# Patient Record
Sex: Male | Born: 1980
Health system: Southern US, Community
[De-identification: ages and names within clinical notes are randomized; demographics above are authoritative.]

## PROBLEM LIST (undated history)

## (undated) DIAGNOSIS — S060XAA Concussion with loss of consciousness status unknown, initial encounter: Secondary | ICD-10-CM

## (undated) DIAGNOSIS — D509 Iron deficiency anemia, unspecified: Secondary | ICD-10-CM

## (undated) DIAGNOSIS — T7840XA Allergy, unspecified, initial encounter: Secondary | ICD-10-CM

## (undated) DIAGNOSIS — S060X9A Concussion with loss of consciousness of unspecified duration, initial encounter: Secondary | ICD-10-CM

## (undated) DIAGNOSIS — Z973 Presence of spectacles and contact lenses: Secondary | ICD-10-CM

## (undated) HISTORY — DX: Concussion with loss of consciousness status unknown, initial encounter: S06.0XAA

## (undated) HISTORY — DX: Concussion with loss of consciousness of unspecified duration, initial encounter: S06.0X9A

## (undated) HISTORY — DX: Allergy, unspecified, initial encounter: T78.40XA

---

## 2000-10-22 HISTORY — PX: MENISCUS REPAIR: SHX5179

## 2014-11-22 ENCOUNTER — Ambulatory Visit: Payer: Self-pay | Admitting: Internal Medicine

## 2017-01-15 ENCOUNTER — Ambulatory Visit (INDEPENDENT_AMBULATORY_CARE_PROVIDER_SITE_OTHER): Payer: Commercial Managed Care - HMO | Admitting: Unknown Physician Specialty

## 2017-01-15 ENCOUNTER — Encounter: Payer: Self-pay | Admitting: Unknown Physician Specialty

## 2017-01-15 VITALS — BP 131/81 | HR 63 | Temp 98.3°F | Ht 69.1 in | Wt 200.5 lb

## 2017-01-15 DIAGNOSIS — Z0000 Encounter for general adult medical examination without abnormal findings: Secondary | ICD-10-CM

## 2017-01-15 DIAGNOSIS — L308 Other specified dermatitis: Secondary | ICD-10-CM | POA: Diagnosis not present

## 2017-01-15 DIAGNOSIS — B977 Papillomavirus as the cause of diseases classified elsewhere: Secondary | ICD-10-CM

## 2017-01-15 DIAGNOSIS — Z23 Encounter for immunization: Secondary | ICD-10-CM | POA: Diagnosis not present

## 2017-01-15 DIAGNOSIS — Z7689 Persons encountering health services in other specified circumstances: Secondary | ICD-10-CM | POA: Diagnosis not present

## 2017-01-15 DIAGNOSIS — L309 Dermatitis, unspecified: Secondary | ICD-10-CM | POA: Insufficient documentation

## 2017-01-15 MED ORDER — IMIQUIMOD 5 % EX CREA: TOPICAL_CREAM | CUTANEOUS | 0 refills | Status: DC

## 2017-01-15 MED ORDER — TRIAMCINOLONE ACETONIDE 0.1 % EX CREA: 1.0000 "application " | TOPICAL_CREAM | Freq: Two times a day (BID) | CUTANEOUS | 0 refills | Status: DC

## 2017-01-15 NOTE — Assessment & Plan Note (Signed)
Rx for aldara

## 2017-01-15 NOTE — Patient Instructions (Addendum)
Tdap Vaccine (Tetanus, Diphtheria and Pertussis): What You Need to Know 1. Why get vaccinated? Tetanus, diphtheria and pertussis are very serious diseases. Tdap vaccine can protect us from these diseases. And, Tdap vaccine given to pregnant women can protect newborn babies against pertussis. TETANUS (Lockjaw) is rare in the [place] today. It causes painful muscle tightening and stiffness, usually all over the body.  It can lead to tightening of muscles in the head and neck so you can't open your mouth, swallow, or sometimes even breathe. Tetanus kills about 1 out of 10 people who are infected even after receiving the best medical care.  DIPHTHERIA is also rare in the [place] today. It can cause a thick coating to form in the back of the throat.  It can lead to breathing problems, heart failure, paralysis, and death.  PERTUSSIS (Whooping Cough) causes severe coughing spells, which can cause difficulty breathing, vomiting and disturbed sleep.  It can also lead to weight loss, incontinence, and rib fractures. Up to 2 in 100 adolescents and 5 in 100 adults with pertussis are hospitalized or have complications, which could include pneumonia or death.  These diseases are caused by bacteria. Diphtheria and pertussis are spread from person to person through secretions from coughing or sneezing. Tetanus enters the body through cuts, scratches, or wounds. Before vaccines, as many as 200,000 cases of diphtheria, 200,000 cases of pertussis, and hundreds of cases of tetanus, were reported in the [place] each year. Since vaccination began, reports of cases for tetanus and diphtheria have dropped by about 99% and for pertussis by about 80%. 2. Tdap vaccine Tdap vaccine can protect adolescents and adults from tetanus, diphtheria, and pertussis. One dose of Tdap is routinely given at age 11 or 12. People who did not get Tdap at that age should get it as soon as possible. Tdap is especially  important for healthcare professionals and anyone having close contact with a baby younger than 12 months. Pregnant women should get a dose of Tdap during every pregnancy, to protect the newborn from pertussis. Infants are most at risk for severe, life-threatening complications from pertussis. Another vaccine, called Td, protects against tetanus and diphtheria, but not pertussis. A Td booster should be given every 10 years. Tdap may be given as one of these boosters if you have never gotten Tdap before. Tdap may also be given after a severe cut or burn to prevent tetanus infection. Your doctor or the person giving you the vaccine can give you more information. Tdap may safely be given at the same time as other vaccines. 3. Some people should not get this vaccine  A person who has ever had a life-threatening allergic reaction after a previous dose of any diphtheria, tetanus or pertussis containing vaccine, OR has a severe allergy to any part of this vaccine, should not get Tdap vaccine. Tell the person giving the vaccine about any severe allergies.  Anyone who had coma or long repeated seizures within 7 days after a childhood dose of DTP or DTaP, or a previous dose of Tdap, should not get Tdap, unless a cause other than the vaccine was found. They can still get Td.  Talk to your doctor if you: ? have seizures or another nervous system problem, ? had severe pain or swelling after any vaccine containing diphtheria, tetanus or pertussis, ? ever had a condition called Guillain-Barr Syndrome (GBS), ? aren't feeling well on the day the shot is scheduled. 4. Risks With any medicine, including   vaccines, there is a chance of side effects. These are usually mild and go away on their own. Serious reactions are also possible but are rare. Most people who get Tdap vaccine do not have any problems with it. Mild problems following Tdap: (Did not interfere with activities)  Pain where the shot was given (about  3 in 4 adolescents or 2 in 3 adults)  Redness or swelling where the shot was given (about 1 person in 5)  Mild fever of at least 100.4F (up to about 1 in 25 adolescents or 1 in 100 adults)  Headache (about 3 or 4 people in 10)  Tiredness (about 1 person in 3 or 4)  Nausea, vomiting, diarrhea, stomach ache (up to 1 in 4 adolescents or 1 in 10 adults)  Chills, sore joints (about 1 person in 10)  Body aches (about 1 person in 3 or 4)  Rash, swollen glands (uncommon)  Moderate problems following Tdap: (Interfered with activities, but did not require medical attention)  Pain where the shot was given (up to 1 in 5 or 6)  Redness or swelling where the shot was given (up to about 1 in 16 adolescents or 1 in 12 adults)  Fever over 102F (about 1 in 100 adolescents or 1 in 250 adults)  Headache (about 1 in 7 adolescents or 1 in 10 adults)  Nausea, vomiting, diarrhea, stomach ache (up to 1 or 3 people in 100)  Swelling of the entire arm where the shot was given (up to about 1 in 500).  Severe problems following Tdap: (Unable to perform usual activities; required medical attention)  Swelling, severe pain, bleeding and redness in the arm where the shot was given (rare).  Problems that could happen after any vaccine:  People sometimes faint after a medical procedure, including vaccination. Sitting or lying down for about 15 minutes can help prevent fainting, and injuries caused by a fall. Tell your doctor if you feel dizzy, or have vision changes or ringing in the ears.  Some people get severe pain in the shoulder and have difficulty moving the arm where a shot was given. This happens very rarely.  Any medication can cause a severe allergic reaction. Such reactions from a vaccine are very rare, estimated at fewer than 1 in a million doses, and would happen within a few minutes to a few hours after the vaccination. As with any medicine, there is a very remote chance of a vaccine  causing a serious injury or death. The safety of vaccines is always being monitored. For more information, visit: www.cdc.gov/vaccinesafety/ 5. What if there is a serious problem? What should I look for? Look for anything that concerns you, such as signs of a severe allergic reaction, very high fever, or unusual behavior. Signs of a severe allergic reaction can include hives, swelling of the face and throat, difficulty breathing, a fast heartbeat, dizziness, and weakness. These would usually start a few minutes to a few hours after the vaccination. What should I do?  If you think it is a severe allergic reaction or other emergency that can't wait, call 9-1-1 or get the person to the nearest hospital. Otherwise, call your doctor.  Afterward, the reaction should be reported to the Vaccine Adverse Event Reporting System (VAERS). Your doctor might file this report, or you can do it yourself through the VAERS web site at www.vaers.hhs.gov, or by calling 1-800-822-7967. ? VAERS does not give medical advice. 6. The National Vaccine Injury Compensation Program The National   Vaccine Injury Compensation Program (VICP) is a federal program that was created to compensate people who may have been injured by certain vaccines. Persons who believe they may have been injured by a vaccine can learn about the program and about filing a claim by calling 1-800-338-2382 or visiting the VICP website at www.hrsa.gov/vaccinecompensation. There is a time limit to file a claim for compensation. 7. How can I learn more?  Ask your doctor. He or she can give you the vaccine package insert or suggest other sources of information.  Call your local or state health department.  Contact the Centers for Disease Control and Prevention (CDC): ? Call 1-800-232-4636 (1-800-CDC-INFO) or ? Visit CDC's website at www.cdc.gov/vaccines CDC Tdap Vaccine VIS (12/15/13) This information is not intended to replace advice given to you by your  health care provider. Make sure you discuss any questions you have with your health care provider. Document Released: 04/08/2012 Document Revised: 06/28/2016 Document Reviewed: 06/28/2016 Elsevier Interactive Patient Education  2017 Elsevier Inc.  

## 2017-01-15 NOTE — Addendum Note (Signed)
Addended by: Kathrine Haddock on: 01/15/2017 04:12 PM   Modules accepted: Orders

## 2017-01-15 NOTE — Progress Notes (Addendum)
BP 131/81 (BP Location: Left Arm, Patient Position: Sitting, Cuff Size: Large)   Pulse 63   Temp 98.3 F (36.8 C)   Ht 5' 9.1" (1.755 m)   Wt 200 lb 8 oz (90.9 kg)   SpO2 98%   BMI 29.52 kg/m    Subjective:    Patient ID: Eric Kline, male    DOB: 30-Jun-1981, 36 y.o.   MRN: [id number]  HPI: Eric Kline is a 36 y.o. male  Chief Complaint  Patient presents with  . Establish Care   Social History   Social History  . Marital status: Single    Spouse name: N/A  . Number of children: N/A  . Years of education: N/A   Occupational History  . Not on file.   Social History Main Topics  . Smoking status: Former Research scientist (life sciences)  . Smokeless tobacco: Former Systems developer    Types: Chew  . Alcohol use No     Comment: pt states he has not drank in the last 5 months   . 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 Sexual activity: Not Currently   Other Topics Concern  . Not on file   Social History Narrative  . No narrative on file   Family History  Problem Relation Age of Onset  . Cancer Mother     skin  . Alzheimer's disease Maternal Grandfather    Past Medical History:  Diagnosis Date  . Allergy   . Concussion    x3   Past Surgical History:  Procedure Laterality Date  . MENISCUS REPAIR  2002   left    Depression screen [facility] 2/9 01/15/2017  Decreased Interest 1  Down, Depressed, Hopeless 1  PHQ - 2 Score 2   Eczema Bilateral palms flare in the winter time.    Gluten Finds if he eats pasta he gets bloated and feels he will pass out  Pt states he is exercising and lost 20 pounds.    Relevant past medical, surgical, family and social history reviewed and updated as indicated. Interim medical history since our last visit reviewed. Allergies and medications reviewed and updated.  Review of Systems  Genitourinary:       New bumps noted base of penis    Per HPI unless specifically indicated above       Objective:    BP 131/81 (BP Location: Left Arm, Patient Position: Sitting, Cuff Size: Large)   Pulse 63   Temp 98.3 F (36.8 C)   Ht 5' 9.1" (1.755 m)   Wt 200 lb 8 oz (90.9 kg)   SpO2 98%   BMI 29.52 kg/m   Wt Readings from Last 3 Encounters:  01/15/17 200 lb 8 oz (90.9 kg)    Physical Exam  Constitutional: He is oriented to person, place, and time. He appears well-developed and well-nourished.  HENT:  Head: Normocephalic.  Right Ear: Tympanic membrane, external ear and ear canal normal.  Left Ear: Tympanic membrane, external ear and ear canal normal.  Mouth/Throat: Uvula is midline, oropharynx is clear and moist and mucous membranes are normal.  Eyes: Pupils are equal, round, and reactive to light.  Cardiovascular: Normal rate, regular rhythm and normal heart sounds.  Exam reveals no gallop and no friction rub.   No murmur heard. Pulmonary/Chest: Effort normal and breath sounds normal. No respiratory distress.  Abdominal: Soft. Bowel sounds are normal. He exhibits no  distension. There is no tenderness.  Musculoskeletal: Normal range of motion.  Neurological: He is alert and oriented to person, place, and time. He has normal reflexes.  Skin: Skin is warm and dry.  Rash palm of hands.  Bumps base of penis  Psychiatric: He has a normal mood and affect. His behavior is normal. Judgment and thought content normal.    No results found for this or any previous visit.    Assessment & Plan:   Problem List Items Addressed This Visit      Unprioritized   Eczema   Relevant Medications   triamcinolone cream (KENALOG) 0.1 %   HPV (human papilloma virus) infection    Rx for aldara      Relevant Medications   imiquimod (ALDARA) 5 % cream (Start on 01/16/2017)    Other Visit Diagnoses    Need for diphtheria-tetanus-pertussis (Tdap) vaccine, adult/adolescent    -  Primary   Relevant Orders   Tdap vaccine greater than or equal to 7yo IM (Completed)   Routine general medical  examination at a health care facility       Relevant Orders   Celiac Ab tTG DGP TIgA   Lipid Panel w/o Chol/HDL Ratio   Comprehensive metabolic panel   HIV antibody   TSH   CBC with Differential/Platelet   HSV(herpes simplex vrs) 1+2 ab-IgG   RPR   GC probe amplification, urine       Follow up plan: Return in about 1 year (around 01/15/2018), or if symptoms worsen or fail to improve.

## 2017-01-16 ENCOUNTER — Encounter: Payer: Self-pay | Admitting: Unknown Physician Specialty

## 2017-01-18 LAB — CBC WITH DIFFERENTIAL/PLATELET
BASOS: 0 %
Basophils Absolute: 0 10*3/uL (ref 0.0–0.2)
EOS (ABSOLUTE): 0.2 10*3/uL (ref 0.0–0.4)
EOS: 2 %
HEMOGLOBIN: 15.7 g/dL (ref 13.0–17.7)
Hematocrit: 44.5 % (ref 37.5–51.0)
Immature Grans (Abs): 0 10*3/uL (ref 0.0–0.1)
Immature Granulocytes: 0 %
Lymphocytes Absolute: 2.3 10*3/uL (ref 0.7–3.1)
Lymphs: 28 %
MCH: 31.5 pg (ref 26.6–33.0)
MCHC: 35.3 g/dL (ref 31.5–35.7)
MCV: 89 fL (ref 79–97)
MONOCYTES: 5 %
MONOS ABS: 0.4 10*3/uL (ref 0.1–0.9)
NEUTROS ABS: 5.4 10*3/uL (ref 1.4–7.0)
Neutrophils: 65 %
Platelets: 278 10*3/uL (ref 150–379)
RBC: 4.98 x10E6/uL (ref 4.14–5.80)
RDW: 13 % (ref 12.3–15.4)
WBC: 8.3 10*3/uL (ref 3.4–10.8)

## 2017-01-18 LAB — LIPID PANEL W/O CHOL/HDL RATIO
Cholesterol, Total: 182 mg/dL (ref 100–199)
HDL: 34 mg/dL — AB (ref >39)
LDL Calculated: 113 mg/dL — ABNORMAL HIGH (ref 0–99)
Triglycerides: 174 mg/dL — ABNORMAL HIGH (ref 0–149)
VLDL CHOLESTEROL CAL: 35 mg/dL (ref 5–40)

## 2017-01-18 LAB — COMPREHENSIVE METABOLIC PANEL
ALK PHOS: 69 IU/L (ref 39–117)
ALT: 16 IU/L (ref 0–44)
AST: 15 IU/L (ref 0–40)
Albumin/Globulin Ratio: 2.2 (ref 1.2–2.2)
Albumin: 4.8 g/dL (ref 3.5–5.5)
BUN/Creatinine Ratio: 10 (ref 9–20)
BUN: 10 mg/dL (ref 6–20)
Bilirubin Total: 0.4 mg/dL (ref 0.0–1.2)
CHLORIDE: 102 mmol/L (ref 96–106)
CO2: 23 mmol/L (ref 18–29)
Calcium: 9.4 mg/dL (ref 8.7–10.2)
Creatinine, Ser: 1.02 mg/dL (ref 0.76–1.27)
GFR, EST AFRICAN AMERICAN: 108 mL/min/{1.73_m2} (ref >59)
GFR, EST NON AFRICAN AMERICAN: 93 mL/min/{1.73_m2} (ref >59)
GLOBULIN, TOTAL: 2.2 g/dL (ref 1.5–4.5)
GLUCOSE: 93 mg/dL (ref 65–99)
POTASSIUM: 4 mmol/L (ref 3.5–5.2)
SODIUM: 143 mmol/L (ref 134–144)
Total Protein: 7 g/dL (ref 6.0–8.5)

## 2017-01-18 LAB — HIV ANTIBODY (ROUTINE TESTING W REFLEX): HIV Screen 4th Generation wRfx: NONREACTIVE

## 2017-01-18 LAB — TSH: TSH: 1.56 u[IU]/mL (ref 0.450–4.500)

## 2017-01-18 LAB — RPR: RPR Ser Ql: NONREACTIVE

## 2017-01-18 LAB — HSV(HERPES SIMPLEX VRS) I + II AB-IGG: HSV 1 Glycoprotein G Ab, IgG: 2.02 index — ABNORMAL HIGH (ref 0.00–0.90)

## 2017-01-18 LAB — CELIAC AB TTG DGP TIGA
Antigliadin Abs, IgA: 3 units (ref 0–19)
Gliadin IgG: 2 units (ref 0–19)
IGA/IMMUNOGLOBULIN A, SERUM: 196 mg/dL (ref 90–386)
Transglutaminase IgA: <2 U/mL (ref 0–3)

## 2017-01-20 LAB — SPECIMEN STATUS REPORT

## 2017-01-20 LAB — GC/CHLAMYDIA PROBE AMP
CHLAMYDIA, DNA PROBE: NEGATIVE
Neisseria gonorrhoeae by PCR: NEGATIVE

## 2017-03-20 ENCOUNTER — Telehealth: Payer: Self-pay | Admitting: Unknown Physician Specialty

## 2017-03-20 DIAGNOSIS — M25569 Pain in unspecified knee: Principal | ICD-10-CM

## 2017-03-20 DIAGNOSIS — G8929 Other chronic pain: Secondary | ICD-10-CM

## 2017-03-20 NOTE — Telephone Encounter (Addendum)
Patient is still having ankle/knee issues. He called to see if he could get Malachy Mood to refer him to orthopedic physician or if he would need to be seen here before she could do that.  Thanks  [phone]

## 2017-03-20 NOTE — Telephone Encounter (Signed)
Routing to provider. I do not see where this was discussed in last OV note.

## 2017-03-20 NOTE — Telephone Encounter (Signed)
Called to let patient know that referral has been entered and patient states that they have already contacted him regarding an appointment.

## 2017-04-03 ENCOUNTER — Ambulatory Visit (INDEPENDENT_AMBULATORY_CARE_PROVIDER_SITE_OTHER): Payer: 59 | Admitting: Orthopedic Surgery

## 2017-04-03 ENCOUNTER — Ambulatory Visit (INDEPENDENT_AMBULATORY_CARE_PROVIDER_SITE_OTHER): Payer: 59

## 2017-04-03 ENCOUNTER — Encounter (INDEPENDENT_AMBULATORY_CARE_PROVIDER_SITE_OTHER): Payer: Self-pay | Admitting: Orthopedic Surgery

## 2017-04-03 DIAGNOSIS — M25572 Pain in left ankle and joints of left foot: Secondary | ICD-10-CM

## 2017-04-03 DIAGNOSIS — G8929 Other chronic pain: Secondary | ICD-10-CM | POA: Diagnosis not present

## 2017-04-03 DIAGNOSIS — M25562 Pain in left knee: Secondary | ICD-10-CM

## 2017-04-06 NOTE — Progress Notes (Signed)
Office Visit Note   Patient: Eric Kline           Date of Birth: 1980/11/11           MRN: [id number] Visit Date: 04/03/2017 Requested by: Kathrine Haddock, NP [address], [place] [id number] PCP: Kathrine Haddock, NP  Subjective: Chief Complaint  Patient presents with  . Left Knee - Pain  . Left Ankle - Pain    HPI: Eric Kline is a patient with left knee pain of 2 months duration.  He's had previous surgery on the left knee with meniscal resection.  Reports pain is constant but it does come and go in varying in severity.  Worse with increased activity.  Does a lot of walking for his job.  Does have difficulty running.  Patient also describes instability of his left ankle.  He states his ankle rolls very easily with weakness and popping.  Previously had left knee surgery in 2002.  That was done in [place].  He feels like the knee problem may be from his ankle.  He did have a severe ankle injury 6 weeks prior to his knee surgery.  His heart for him to run.  He can only run 1/8 of a mile without pain in both the knee and ankle.  Describes pain in the patellar region.  He works as a Radiographer, therapeutic.  He is in [employer] at the time of his knee and ankle injury.              ROS: All systems reviewed are negative as they relate to the chief complaint within the history of present illness.  Patient denies  fevers or chills.   Assessment & Plan: Visit Diagnoses:  1. Chronic pain of left knee   2. 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  He's had 2 months of symptoms and has failed conservative measurements including self rehabilitation as well as a course of anti-inflammatories.  Needs MRI scan of that left knee to evaluate both medial and lateral meniscal tearing.  More significant is his significant left ankle instability.  He will need surgical reconstruction of the left ankle.  Need to evaluate  whether or not in addition to severe ligamentous laxity he has chondral damage on the articular surface.  MRI of ankle is also pending.  I'll see him back after the studies  Follow-Up Instructions: Return for after MRI.   Orders:  Orders Placed This Encounter  Procedures  . XR KNEE 3 VIEW LEFT  . XR Ankle Complete Left  . MR Knee Left w/o contrast  . MR Ankle Left w/o contrast   No orders of the defined types were placed in this encounter.     Procedures: No procedures performed   Clinical Data: No additional findings.  Objective: Vital Signs: There were no vitals taken for this visit.  Physical Exam:   Constitutional: Patient appears well-developed HEENT:  Head: Normocephalic Eyes:EOM are normal Neck: Normal range of motion Cardiovascular: Normal rate Pulmonary/chest: Effort normal Neurologic: Patient is alert Skin: Skin is warm Psychiatric: Patient has normal mood and affect    Ortho Exam: Orthopedic examination of the ankle demonstrates only mild swelling of the left ankle but significant instability left versus right to varus tilt testing and anterior drawer testing.  He has palpable intact nontender anterior tibia.  Posterior tib peroneal and Achilles tendon with palpable  pedal pulses and intact sensation on the dorsal plantar aspect of the foot.  No other masses lymph adenopathy or skin changes noted in the left ankle region.  On examination of the left knee he has trace effusion stable collateral crucial ligaments intact since mechanism medial and lateral joint line tenderness positive direct compression testing for medial and lateral compartment pathology with mild patellofemoral crepitus and negative patellar apprehension and no tenderness of the patellar quad tendon.  There is no groin pain with internal/external rotation of the leg and no other masses lymph adenopathy or skin changes noted in the left knee region  Specialty Comments:  No specialty comments  available.  Imaging: No results found.   PMFS History: Patient Active Problem List   Diagnosis Date Noted  . HPV (human papilloma virus) infection 01/15/2017  . Eczema 01/15/2017   Past Medical History:  Diagnosis Date  . Allergy   . Concussion    x3    Family History  Problem Relation Age of Onset  . Cancer Mother        skin  . Alzheimer's disease Maternal Grandfather     Past Surgical History:  Procedure Laterality Date  . MENISCUS REPAIR  2002   left    Social History   Occupational History  . Not on file.   Social History Main Topics  . Smoking status: Former Research scientist (life sciences)  . Smokeless tobacco: Former Systems developer    Types: Chew  . Alcohol use No     Comment: pt states he has not drank in the last 5 months   . 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 Sexual activity: Not Currently

## 2017-04-19 ENCOUNTER — Ambulatory Visit (INDEPENDENT_AMBULATORY_CARE_PROVIDER_SITE_OTHER): Payer: 59 | Admitting: Orthopedic Surgery

## 2017-04-21 ENCOUNTER — Ambulatory Visit
Admission: RE | Admit: 2017-04-21 | Discharge: 2017-04-21 | Disposition: A | Discharge status: Home / Self Care | Payer: 59 | Source: Ambulatory Visit | Attending: Orthopedic Surgery | Admitting: Orthopedic Surgery

## 2017-04-21 DIAGNOSIS — M25572 Pain in left ankle and joints of left foot: Secondary | ICD-10-CM | POA: Diagnosis not present

## 2017-04-21 DIAGNOSIS — M25562 Pain in left knee: Principal | ICD-10-CM

## 2017-04-21 DIAGNOSIS — G8929 Other chronic pain: Secondary | ICD-10-CM

## 2017-04-21 DIAGNOSIS — S83242A Other tear of medial meniscus, current injury, left knee, initial encounter: Secondary | ICD-10-CM | POA: Diagnosis not present

## 2017-04-21 HISTORY — PX: ANKLE ARTHROSCOPY: SUR85

## 2017-04-22 ENCOUNTER — Ambulatory Visit (INDEPENDENT_AMBULATORY_CARE_PROVIDER_SITE_OTHER): Payer: 59 | Admitting: Orthopedic Surgery

## 2017-04-22 ENCOUNTER — Encounter (INDEPENDENT_AMBULATORY_CARE_PROVIDER_SITE_OTHER): Payer: Self-pay | Admitting: Orthopedic Surgery

## 2017-04-22 DIAGNOSIS — M25572 Pain in left ankle and joints of left foot: Secondary | ICD-10-CM | POA: Diagnosis not present

## 2017-04-22 NOTE — Addendum Note (Signed)
Addended by: Marcene Duos on: 04/22/2017 05:48 PM   Modules accepted: Level of Service

## 2017-04-22 NOTE — Progress Notes (Signed)
Office Visit Note   Patient: Eric Kline           Date of Birth: 07-22-1981           MRN: [id number] Visit Date: 04/22/2017 Requested by: Kathrine Haddock, NP [address], [place] [address] PCP: Kathrine Haddock, NP  Subjective: Chief Complaint  Patient presents with  . Left Ankle - Follow-up    HPI: Eric Kline is a 36 year old patient here to review MRI of left ankle as well as MRI of left knee.  He's had previous arthroscopy in the left knee in 2002.  MRI scan on that knee is reviewed and it shows post-meniscectomy changes but there may also be a component of meniscal instability with that remnant fragment.  He states his ankle is hurting him worse than the knee.  Ankle MRI scan also reviewed and shows incompetency of the anterior talofibular ligament as well as a cyst in the anterolateral aspect of the ankle.  Had Dr. due to review the MRI of the ankle as well as examined Eric Kline.  He concurs that Eric Kline is a good candidate for surgical stabilization of the ankle              ROS: All systems reviewed are negative as they relate to the chief complaint within the history of present illness.  Patient denies  fevers or chills.   Assessment & Plan: Visit Diagnoses:  1. Pain in left ankle and joints of left foot     Plan: Impression is left ankle instability in need of surgical stabilization.  Patient does have rolling of the ankle about once every 2-3 days.  He has significant anterior and varus instability to the ankle.  Dr. due to will address this surgically in the near future.  In regards to the left knee this is something that we can look at after he is recovered from his ankle.  Arthroscopic debridement and/or potential all inside meniscal repair could be considered for Eric Kline depending on what the findings are.  I will see him back as needed regarding the knee  Follow-Up Instructions: No Follow-up on file.   Orders:  No orders of the defined types were placed in this  encounter.  No orders of the defined types were placed in this encounter.     Procedures: No procedures performed   Clinical Data: No additional findings.  Objective: Vital Signs: There were no vitals taken for this visit.  Physical Exam:   Constitutional: Patient appears well-developed HEENT:  Head: Normocephalic Eyes:EOM are normal Neck: Normal range of motion Cardiovascular: Normal rate Pulmonary/chest: Effort normal Neurologic: Patient is alert Skin: Skin is warm Psychiatric: Patient has normal mood and affect    Ortho Exam: Orthopedic exam demonstrates increased anterior instability left ankle versus right as well as increased instability to varus stress testing left versus right.  On the left knee there is no effusion and good range of motion most of his pain is directly beneath the patella.  Extensor mechanism is intact.  Collateral and cruciate ligaments are stable.  Specialty Comments:  No specialty comments available.  Imaging: No results found.   PMFS History: Patient Active Problem List   Diagnosis Date Noted  . HPV (human papilloma virus) infection 01/15/2017  . Eczema 01/15/2017   Past Medical History:  Diagnosis Date  . Allergy   . Concussion    x3    Family History  Problem Relation Age of Onset  . Cancer Mother  skin  . Alzheimer's disease Maternal Grandfather     Past Surgical History:  Procedure Laterality Date  . MENISCUS REPAIR  2002   left    Social History   Occupational History  . Not on file.   Social History Main Topics  . Smoking status: Former Research scientist (life sciences)  . Smokeless tobacco: Former Systems developer    Types: Chew  . Alcohol use No     Comment: pt states he has not drank in the last 5 months   . 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 Sexual activity: Not Currently

## 2017-04-22 NOTE — Progress Notes (Signed)
   Office Visit Note   Patient: Eric Kline           Date of Birth: May 09, 1981           MRN: [id number] Visit Date: 04/22/2017              Requested by: Kathrine Haddock, NP [address], [place] [address] PCP: Kathrine Haddock, NP  Chief Complaint  Patient presents with  . Left Ankle - Follow-up      HPI: Chronic ankle instability left ankle.  Assessment & Plan: Visit Diagnoses:  1. Pain in left ankle and joints of left foot     Plan: Patient states that he has failed prolonged conservative therapy. Discussed with patient we could proceed with reconstruction of the anterior talofibular ligament with an open Brostrom reconstruction. Would then proceed with arthroscopy of the ankle to debride the synovitis and impingement. Discussed that he would need to be off his foot for 4 weeks and then a fracture boot for 4 weeks. Risks and benefits were discussed patient states he understands wish to proceed at this time.  Follow-Up Instructions: No Follow-up on file.   Ortho Exam  Patient is alert, oriented, no adenopathy, well-dressed, normal affect, normal respiratory effort. Examination patient has a positive anterior drawer on the left with a clunk. There is approximately 3 mm of anterior translation as well as increased instability with varus stressing. He also has tenderness to palpation anteriorly over the ankle.  Imaging: No results found.  Labs: No results found for: HGBA1C, ESRSEDRATE, CRP, LABURIC, REPTSTATUS, GRAMSTAIN, CULT, LABORGA  Orders:  No orders of the defined types were placed in this encounter.  No orders of the defined types were placed in this encounter.    Procedures: No procedures performed  Clinical Data: No additional findings.  ROS:  All other systems negative, except as noted in the HPI. Review of Systems  Objective: Vital Signs: There were no vitals taken for this visit.  Specialty Comments:  No specialty comments available.  PMFS  History: Patient Active Problem List   Diagnosis Date Noted  . HPV (human papilloma virus) infection 01/15/2017  . Eczema 01/15/2017   Past Medical History:  Diagnosis Date  . Allergy   . Concussion    x3    Family History  Problem Relation Age of Onset  . Cancer Mother        skin  . Alzheimer's disease Maternal Grandfather     Past Surgical History:  Procedure Laterality Date  . MENISCUS REPAIR  2002   left    Social History   Occupational History  . Not on file.   Social History Main Topics  . Smoking status: Former Research scientist (life sciences)  . Smokeless tobacco: Former Systems developer    Types: Chew  . Alcohol use No     Comment: pt states he has not drank in the last 5 months   . 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 Sexual activity: Not Currently

## 2017-04-23 ENCOUNTER — Telehealth (INDEPENDENT_AMBULATORY_CARE_PROVIDER_SITE_OTHER): Payer: Self-pay

## 2017-04-23 ENCOUNTER — Other Ambulatory Visit (INDEPENDENT_AMBULATORY_CARE_PROVIDER_SITE_OTHER): Payer: Self-pay

## 2017-04-23 NOTE — Telephone Encounter (Signed)
Patient is undergoing an ankle reconstruction and ankle arthroscopy. Patient could return to work in 1-2 weeks for seated work.

## 2017-04-23 NOTE — Telephone Encounter (Signed)
You saw this pt was Dr. Marlou Sa yesterday and are going to do surgery sometime next week. What procedure are you doing and what time frame would you recommend the pt will be out of work?

## 2017-04-23 NOTE — Telephone Encounter (Signed)
Pt called and states that he is having surgery with Dr. Sharol Given next week and requires a note for his employer to state that he will be out of work for a few weeks for recovery and to please call him once this has been done.

## 2017-04-23 NOTE — Telephone Encounter (Signed)
Letter written and advised pt that it is ready for pick up or he can access this in my chart.

## 2017-04-29 ENCOUNTER — Telehealth (INDEPENDENT_AMBULATORY_CARE_PROVIDER_SITE_OTHER): Payer: Self-pay | Admitting: Orthopedic Surgery

## 2017-04-29 NOTE — Telephone Encounter (Signed)
PT ASKED IF YOU COULD PUT HIS WORK NOTE IN MYCHART PLEASE.  [phone]

## 2017-04-29 NOTE — Telephone Encounter (Signed)
Sent to patient.

## 2017-04-30 DIAGNOSIS — M19172 Post-traumatic osteoarthritis, left ankle and foot: Secondary | ICD-10-CM | POA: Diagnosis not present

## 2017-04-30 DIAGNOSIS — M25872 Other specified joint disorders, left ankle and foot: Secondary | ICD-10-CM | POA: Diagnosis not present

## 2017-04-30 DIAGNOSIS — S93412D Sprain of calcaneofibular ligament of left ankle, subsequent encounter: Secondary | ICD-10-CM | POA: Diagnosis not present

## 2017-04-30 DIAGNOSIS — M25372 Other instability, left ankle: Secondary | ICD-10-CM | POA: Diagnosis not present

## 2017-04-30 DIAGNOSIS — M659 Synovitis and tenosynovitis, unspecified: Secondary | ICD-10-CM | POA: Diagnosis not present

## 2017-05-08 ENCOUNTER — Ambulatory Visit (INDEPENDENT_AMBULATORY_CARE_PROVIDER_SITE_OTHER): Payer: 59 | Admitting: Family

## 2017-05-08 DIAGNOSIS — Z9889 Other specified postprocedural states: Secondary | ICD-10-CM

## 2017-05-08 NOTE — Progress Notes (Signed)
   Post-Op Visit Note   Patient: Eric Kline           Date of Birth: 09/27/1981           MRN: [id number] Visit Date: 05/08/2017 PCP: Kathrine Haddock, NP  Chief Complaint:  Chief Complaint  Patient presents with  . Left Ankle - Routine Post Op    HPI:  The patient is a 36 year old gentleman who is 1 week status post left ankle arthroscopy with anterior talofibular ligament reconstruction. He's been doing well overall. He has been nonweightbearing with crutches. The surgical dressing was removed today.    Ortho Exam Incisions are him healing well the portals are clean and dry. Sutures to be harvested today. There is no gaping, no drainage. moderate swelling to the ankle.  Visit Diagnoses:  1. S/P surgical manipulation of ankle joint     Plan: Sutures harvested today. He'll begin daily Dial soap cleansing. Apply dry dressing. Placed in a Cam Walker he may begin weightbearing in the cam boot. Advised wear this ambulation. do to work on dorsiflexion of the ankle.  Follow-Up Instructions: Return in about 2 weeks (around 05/22/2017).   Imaging: No results found.  Orders:  No orders of the defined types were placed in this encounter.  No orders of the defined types were placed in this encounter.    PMFS History: Patient Active Problem List   Diagnosis Date Noted  . S/P surgical manipulation of ankle joint 05/08/2017  . HPV (human papilloma virus) infection 01/15/2017  . Eczema 01/15/2017   Past Medical History:  Diagnosis Date  . Allergy   . Concussion    x3    Family History  Problem Relation Age of Onset  . Cancer Mother        skin  . Alzheimer's disease Maternal Grandfather     Past Surgical History:  Procedure Laterality Date  . MENISCUS REPAIR  2002   left    Social History   Occupational History  . Not on file.   Social History Main Topics  . Smoking status: Former Research scientist (life sciences)  . Smokeless tobacco: Former Systems developer    Types: Chew  . Alcohol use No   Comment: pt states he has not drank in the last 5 months   . 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 Sexual activity: Not Currently

## 2017-05-22 ENCOUNTER — Encounter (INDEPENDENT_AMBULATORY_CARE_PROVIDER_SITE_OTHER): Payer: Self-pay | Admitting: Orthopedic Surgery

## 2017-05-22 ENCOUNTER — Ambulatory Visit (INDEPENDENT_AMBULATORY_CARE_PROVIDER_SITE_OTHER): Payer: 59 | Admitting: Family

## 2017-05-22 ENCOUNTER — Ambulatory Visit (INDEPENDENT_AMBULATORY_CARE_PROVIDER_SITE_OTHER): Payer: 59 | Admitting: Orthopedic Surgery

## 2017-05-22 VITALS — Ht 69.0 in | Wt 200.0 lb

## 2017-05-22 DIAGNOSIS — M25872 Other specified joint disorders, left ankle and foot: Secondary | ICD-10-CM | POA: Insufficient documentation

## 2017-05-22 NOTE — Progress Notes (Signed)
   Office Visit Note   Patient: Eric Kline           Date of Birth: 04/23/1981           MRN: [id number] Visit Date: 05/22/2017              Requested by: Kathrine Haddock, NP [address], [place] [address] PCP: Kathrine Haddock, NP  Chief Complaint  Patient presents with  . Left Ankle - Routine Post Op    3 weeks post op ankle scope with anterior talofibular ligament reconstruction       HPI: Patient presents 3 weeks status post debridement for impingement left ankle as well as anterior talofibular ligament reconstruction. Patient has no complaints he is weightbearing with a fracture boot he states he does have some tenderness when he does not wear the fracture boot.  Assessment & Plan: Visit Diagnoses:  1. Ankle impingement syndrome, left     Plan: Recommended to work on range of motion exercises no strengthening at this time continue with her fracture boot for all activities follow-up in 3 weeks at which time we will discontinue the fracture boot.  Follow-Up Instructions: Return in about 3 weeks (around 06/12/2017).   Ortho Exam  Patient is alert, oriented, no adenopathy, well-dressed, normal affect, normal respiratory effort. Examination the incision and portals are clean and dry there is no swelling there is no redness no cellulitis no drainage. Patient has a stable anterior drawer of 1 mm there is no laxity there is good stability of the anterior talofibular ligament.  Imaging: No results found.  Labs: No results found for: HGBA1C, ESRSEDRATE, CRP, LABURIC, REPTSTATUS, GRAMSTAIN, CULT, LABORGA  Orders:  No orders of the defined types were placed in this encounter.  No orders of the defined types were placed in this encounter.    Procedures: No procedures performed  Clinical Data: No additional findings.  ROS:  All other systems negative, except as noted in the HPI. Review of Systems  Objective: Vital Signs: Ht 5\' 9"  (1.753 m)   Wt 200 lb (90.7 kg)   BMI  29.53 kg/m   Specialty Comments:  No specialty comments available.  PMFS History: Patient Active Problem List   Diagnosis Date Noted  . Ankle impingement syndrome, left 05/22/2017  . S/P surgical manipulation of ankle joint 05/08/2017  . HPV (human papilloma virus) infection 01/15/2017  . Eczema 01/15/2017   Past Medical History:  Diagnosis Date  . Allergy   . Concussion    x3    Family History  Problem Relation Age of Onset  . Cancer Mother        skin  . Alzheimer's disease Maternal Grandfather     Past Surgical History:  Procedure Laterality Date  . MENISCUS REPAIR  2002   left    Social History   Occupational History  . Not on file.   Social History Main Topics  . Smoking status: Former Research scientist (life sciences)  . Smokeless tobacco: Former Systems developer    Types: Chew  . Alcohol use No     Comment: pt states he has not drank in the last 5 months   . 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 Sexual activity: Not Currently

## 2017-06-11 ENCOUNTER — Ambulatory Visit (INDEPENDENT_AMBULATORY_CARE_PROVIDER_SITE_OTHER): Payer: 59 | Admitting: Orthopedic Surgery

## 2017-06-11 ENCOUNTER — Encounter (INDEPENDENT_AMBULATORY_CARE_PROVIDER_SITE_OTHER): Payer: Self-pay | Admitting: Orthopedic Surgery

## 2017-06-11 DIAGNOSIS — M25872 Other specified joint disorders, left ankle and foot: Secondary | ICD-10-CM | POA: Diagnosis not present

## 2017-06-11 NOTE — Progress Notes (Signed)
   Office Visit Note   Patient: Eric Kline           Date of Birth: 1981/08/05           MRN: [id number] Visit Date: 06/11/2017              Requested by: Kathrine Haddock, NP [address], [place] [address] PCP: Kathrine Haddock, NP  Chief Complaint  Patient presents with  . Left Ankle - Follow-up      HPI: Patient is a 36 year old gentleman 6 weeks status post Brostrom reconstruction of the anterior talofibular ligament left ankle with debridement of the ankle arthroscopically. Patient states he is comfortable no problems no pain.  Assessment & Plan: Visit Diagnoses:  1. Ankle impingement syndrome, left     Plan: Patient was given instructions for heel cord stretching to unload weight from the fifth metatarsal head. He will advance to an ankle stabilizing orthosis for activities daily living and increase his activities as he feels comfortable. No running. Examination the portals and incision are well-healed. Anterior drawer is stable with no laxity.  Follow-Up Instructions: Return in about 4 weeks (around 07/09/2017).   Ortho Exam  Patient is alert, oriented, no adenopathy, well-dressed, normal affect, normal respiratory effort. Examination the portals and surgical incision are well healed. He has good range of motion the ankle and subtalar joint. Anterior drawer is stable with no laxity.  Imaging: No results found. No images are attached to the encounter.  Labs: No results found for: HGBA1C, ESRSEDRATE, CRP, LABURIC, REPTSTATUS, GRAMSTAIN, CULT, LABORGA  Orders:  No orders of the defined types were placed in this encounter.  No orders of the defined types were placed in this encounter.    Procedures: No procedures performed  Clinical Data: No additional findings.  ROS:  All other systems negative, except as noted in the HPI. Review of Systems  Objective: Vital Signs: There were no vitals taken for this visit.  Specialty Comments:  No specialty comments  available.  PMFS History: Patient Active Problem List   Diagnosis Date Noted  . Ankle impingement syndrome, left 05/22/2017  . S/P surgical manipulation of ankle joint 05/08/2017  . HPV (human papilloma virus) infection 01/15/2017  . Eczema 01/15/2017   Past Medical History:  Diagnosis Date  . Allergy   . Concussion    x3    Family History  Problem Relation Age of Onset  . Cancer Mother        skin  . Alzheimer's disease Maternal Grandfather     Past Surgical History:  Procedure Laterality Date  . MENISCUS REPAIR  2002   left    Social History   Occupational History  . Not on file.   Social History Main Topics  . Smoking status: Former Research scientist (life sciences)  . Smokeless tobacco: Former Systems developer    Types: Chew  . Alcohol use No     Comment: pt states he has not drank in the last 5 months   . 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 Sexual activity: Not Currently

## 2017-07-09 ENCOUNTER — Encounter (INDEPENDENT_AMBULATORY_CARE_PROVIDER_SITE_OTHER): Payer: Self-pay

## 2017-07-09 ENCOUNTER — Ambulatory Visit (INDEPENDENT_AMBULATORY_CARE_PROVIDER_SITE_OTHER): Payer: 59 | Admitting: Orthopedic Surgery

## 2017-07-09 ENCOUNTER — Encounter (INDEPENDENT_AMBULATORY_CARE_PROVIDER_SITE_OTHER): Payer: Self-pay | Admitting: Orthopedic Surgery

## 2017-07-09 DIAGNOSIS — M25872 Other specified joint disorders, left ankle and foot: Secondary | ICD-10-CM

## 2017-07-10 NOTE — Progress Notes (Signed)
Patient left the office before being seen follow-up next week

## 2017-07-22 ENCOUNTER — Encounter (INDEPENDENT_AMBULATORY_CARE_PROVIDER_SITE_OTHER): Payer: Self-pay | Admitting: Orthopedic Surgery

## 2017-07-22 ENCOUNTER — Ambulatory Visit (INDEPENDENT_AMBULATORY_CARE_PROVIDER_SITE_OTHER): Payer: 59 | Admitting: Orthopedic Surgery

## 2017-07-22 DIAGNOSIS — M25562 Pain in left knee: Secondary | ICD-10-CM

## 2017-07-22 DIAGNOSIS — G8929 Other chronic pain: Secondary | ICD-10-CM

## 2017-07-22 NOTE — Progress Notes (Signed)
   Office Visit Note   Patient: Eric Kline           Date of Birth: [birth year]           MRN: [id number] Visit Date: 07/22/2017              Requested by: Kathrine Haddock, NP [address], [place] [address] PCP: Kathrine Haddock, NP  Chief Complaint  Patient presents with  . Left Ankle - Pain  . Left Knee - Pain      HPI: Patient presents in follow-up approximately 3 months status post ankle reconstruction. Patient states his ankle is getting better daily he is still wearing his ankle stabilizing orthosis. Patient complains of pain around the patella of the left knee states his knee underwent arthroscopy debridement in 2002 and his having pain primarily in the patellofemoral joint.  Assessment & Plan: Visit Diagnoses:  1. Chronic pain of left knee     Plan: Patient was given instructions for VMO strengthening with quad isometrics straight leg raises and leg presses he is to avoid extension and flexion strengthening. He will avoid open chain kinetic exercises.   Follow-Up Instructions: Return if symptoms worsen or fail to improve.   Ortho Exam  Patient is alert, oriented, no adenopathy, well-dressed, normal affect, normal respiratory effort. Examination the anterior drawer of the ankle was stable there is no effusion there is no tenderness to palpation. Examination the left knee the medial and lateral joint lines are nontender to palpation) are stable he is tender to palpation of the medial lateral facets of the patella. There is no effusion no redness.  Imaging: No results found. No images are attached to the encounter.  Labs: No results found for: HGBA1C, ESRSEDRATE, CRP, LABURIC, REPTSTATUS, GRAMSTAIN, CULT, LABORGA  Orders:  No orders of the defined types were placed in this encounter.  No orders of the defined types were placed in this encounter.    Procedures: No procedures performed  Clinical Data: No additional findings.  ROS:  All other systems negative,  except as noted in the HPI. Review of Systems  Objective: Vital Signs: There were no vitals taken for this visit.  Specialty Comments:  No specialty comments available.  PMFS History: Patient Active Problem List   Diagnosis Date Noted  . Ankle impingement syndrome, left 05/22/2017  . S/P surgical manipulation of ankle joint 05/08/2017  . HPV (human papilloma virus) infection 01/15/2017  . Eczema 01/15/2017   Past Medical History:  Diagnosis Date  . Allergy   . Concussion    x3    Family History  Problem Relation Age of Onset  . Cancer Mother        skin  . Alzheimer's disease Maternal Grandfather     Past Surgical History:  Procedure Laterality Date  . MENISCUS REPAIR  2002   left    Social History   Occupational History  . Not on file.   Social History Main Topics  . Smoking status: Former Research scientist (life sciences)  . Smokeless tobacco: Former Systems developer    Types: Chew  . Alcohol use No     Comment: pt states he has not drank in the last 5 months   . 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 Sexual activity: Not Currently

## 2017-11-05 ENCOUNTER — Ambulatory Visit
Admission: RE | Admit: 2017-11-05 | Discharge: 2017-11-05 | Disposition: A | Discharge status: Home / Self Care | Payer: 59 | Source: Ambulatory Visit | Attending: Unknown Physician Specialty | Admitting: Unknown Physician Specialty

## 2017-11-05 ENCOUNTER — Ambulatory Visit: Payer: 59 | Admitting: Unknown Physician Specialty

## 2017-11-05 ENCOUNTER — Encounter: Payer: Self-pay | Admitting: Unknown Physician Specialty

## 2017-11-05 VITALS — BP 124/85 | HR 89 | Temp 99.1°F | Wt 205.2 lb

## 2017-11-05 DIAGNOSIS — R52 Pain, unspecified: Secondary | ICD-10-CM

## 2017-11-05 DIAGNOSIS — J069 Acute upper respiratory infection, unspecified: Secondary | ICD-10-CM | POA: Diagnosis not present

## 2017-11-05 DIAGNOSIS — R05 Cough: Secondary | ICD-10-CM | POA: Diagnosis not present

## 2017-11-05 DIAGNOSIS — R059 Cough, unspecified: Secondary | ICD-10-CM

## 2017-11-05 DIAGNOSIS — J029 Acute pharyngitis, unspecified: Secondary | ICD-10-CM | POA: Diagnosis not present

## 2017-11-05 MED ORDER — HYDROCOD POLST-CPM POLST ER 10-8 MG/5ML PO SUER: 5.0000 mL | Freq: Two times a day (BID) | ORAL | 0 refills | Status: DC | PRN

## 2017-11-05 NOTE — Progress Notes (Signed)
BP 124/85   Pulse 89   Temp 99.1 F (37.3 C) (Oral)   Wt 205 lb 3.2 oz (93.1 kg)   SpO2 96%   BMI 30.30 kg/m    Subjective:    Patient ID: Eric Kline, male    DOB: 01-31-1981, 37 y.o.   MRN: [id number]  HPI: Eric Kline is a 37 y.o. male  Chief Complaint  Patient presents with  . URI    pt states he has had a sore throat, congestion, and body aches since yesterday   URI   This is a new problem. Episode onset: 2 days. The problem has been gradually worsening. There has been no fever. Associated symptoms include coughing and rhinorrhea. Pertinent negatives include no abdominal pain, chest pain, congestion, diarrhea, dysuria, ear pain, headaches, joint pain, joint swelling, nausea, neck pain, plugged ear sensation, rash, sinus pain, sneezing, sore throat, swollen glands, vomiting or wheezing. Associated symptoms comments: myalgias. He has tried nothing for the symptoms.     Relevant past medical, surgical, family and social history reviewed and updated as indicated. Interim medical history since our last visit reviewed. Allergies and medications reviewed and updated.  Review of Systems  HENT: Positive for rhinorrhea. Negative for congestion, ear pain, sinus pain, sneezing and sore throat.   Respiratory: Positive for cough. Negative for wheezing.   Cardiovascular: Negative for chest pain.  Gastrointestinal: Negative for abdominal pain, diarrhea, nausea and vomiting.  Genitourinary: Negative for dysuria.  Musculoskeletal: Negative for joint pain and neck pain.  Skin: Negative for rash.  Neurological: Negative for headaches.    Per HPI unless specifically indicated above     Objective:    BP 124/85   Pulse 89   Temp 99.1 F (37.3 C) (Oral)   Wt 205 lb 3.2 oz (93.1 kg)   SpO2 96%   BMI 30.30 kg/m   Wt Readings from Last 3 Encounters:  11/05/17 205 lb 3.2 oz (93.1 kg)  05/22/17 200 lb (90.7 kg)  01/15/17 200 lb 8 oz (90.9 kg)    Physical Exam  Constitutional:  He is oriented to person, place, and time. He appears well-developed and well-nourished. No distress.  HENT:  Head: Normocephalic and atraumatic.  Right Ear: Tympanic membrane and ear canal normal.  Left Ear: Tympanic membrane and ear canal normal.  Nose: Rhinorrhea present. Right sinus exhibits no maxillary sinus tenderness and no frontal sinus tenderness. Left sinus exhibits no maxillary sinus tenderness and no frontal sinus tenderness.  Mouth/Throat: Uvula is midline. Posterior oropharyngeal edema present.  Eyes: Conjunctivae and lids are normal. Right eye exhibits no discharge. Left eye exhibits no discharge. No scleral icterus.  Neck: Neck supple.  Cardiovascular: Normal rate, regular rhythm and normal heart sounds.  Pulmonary/Chest: Effort normal and breath sounds normal. No respiratory distress.  Abdominal: Normal appearance. There is no splenomegaly or hepatomegaly.  Musculoskeletal: Normal range of motion.  Neurological: He is alert and oriented to person, place, and time.  Skin: Skin is warm, dry and intact. No rash noted. No pallor.  Psychiatric: He has a normal mood and affect. His behavior is normal. Judgment and thought content normal.  Nursing note and vitals reviewed.   Results for orders placed or performed in visit on 01/15/17  GC/Chlamydia Probe Amp  Result Value Ref Range   Chlamydia trachomatis, NAA Negative Negative   Neisseria gonorrhoeae by PCR Negative Negative  Celiac Ab tTG DGP TIgA  Result Value Ref Range   IgA/Immunoglobulin A, Serum 196 90 - 386  mg/dL   Antigliadin Abs, IgA 3 0 - 19 units   Gliadin IgG 2 0 - 19 units   Transglutaminase IgA <2 0 - 3 U/mL   Tissue Transglut Ab <2 0 - 5 U/mL  Lipid Panel w/o Chol/HDL Ratio  Result Value Ref Range   Cholesterol, Total 182 100 - 199 mg/dL   Triglycerides 174 (H) 0 - 149 mg/dL   HDL 34 (L) >39 mg/dL   VLDL Cholesterol Cal 35 5 - 40 mg/dL   LDL Calculated 113 (H) 0 - 99 mg/dL  Comprehensive metabolic panel   Result Value Ref Range   Glucose 93 65 - 99 mg/dL   BUN 10 6 - 20 mg/dL   Creatinine, Ser 1.02 0.76 - 1.27 mg/dL   GFR calc non Af Amer 93 >59 mL/min/1.73   GFR calc Af Amer 108 >59 mL/min/1.73   BUN/Creatinine Ratio 10 9 - 20   Sodium 143 134 - 144 mmol/L   Potassium 4.0 3.5 - 5.2 mmol/L   Chloride 102 96 - 106 mmol/L   CO2 23 18 - 29 mmol/L   Calcium 9.4 8.7 - 10.2 mg/dL   Total Protein 7.0 6.0 - 8.5 g/dL   Albumin 4.8 3.5 - 5.5 g/dL   Globulin, Total 2.2 1.5 - 4.5 g/dL   Albumin/Globulin Ratio 2.2 1.2 - 2.2   Bilirubin Total 0.4 0.0 - 1.2 mg/dL   Alkaline Phosphatase 69 39 - 117 IU/L   AST 15 0 - 40 IU/L   ALT 16 0 - 44 IU/L  HIV antibody  Result Value Ref Range   HIV Screen 4th Generation wRfx Non Reactive Non Reactive  TSH  Result Value Ref Range   TSH 1.560 0.450 - 4.500 uIU/mL  CBC with Differential/Platelet  Result Value Ref Range   WBC 8.3 3.4 - 10.8 x10E3/uL   RBC 4.98 4.14 - 5.80 x10E6/uL   Hemoglobin 15.7 13.0 - 17.7 g/dL   Hematocrit 44.5 37.5 - 51.0 %   MCV 89 79 - 97 fL   MCH 31.5 26.6 - 33.0 pg   MCHC 35.3 31.5 - 35.7 g/dL   RDW 13.0 12.3 - 15.4 %   Platelets 278 150 - 379 x10E3/uL   Neutrophils 65 Not Estab. %   Lymphs 28 Not Estab. %   Monocytes 5 Not Estab. %   Eos 2 Not Estab. %   Basos 0 Not Estab. %   Neutrophils Absolute 5.4 1.4 - 7.0 x10E3/uL   Lymphocytes Absolute 2.3 0.7 - 3.1 x10E3/uL   Monocytes Absolute 0.4 0.1 - 0.9 x10E3/uL   EOS (ABSOLUTE) 0.2 0.0 - 0.4 x10E3/uL   Basophils Absolute 0.0 0.0 - 0.2 x10E3/uL   Immature Granulocytes 0 Not Estab. %   Immature Grans (Abs) 0.0 0.0 - 0.1 x10E3/uL  HSV(herpes simplex vrs) 1+2 ab-IgG  Result Value Ref Range   HSV 1 Glycoprotein G Ab, IgG 2.02 (H) 0.00 - 0.90 index   HSV 2 Glycoprotein G Ab, IgG <0.91 0.00 - 0.90 index  RPR  Result Value Ref Range   RPR Ser Ql Non Reactive Non Reactive  Specimen status report  Result Value Ref Range   specimen status report Comment       Assessment &  Plan:   Problem List Items Addressed This Visit    None    Visit Diagnoses    Sore throat    -  Primary   Relevant Orders   Rapid Strep Screen (Not at [facility])   Body  aches       Relevant Orders   Veritor Flu A/B Waived   Viral upper respiratory tract infection       Pt ed on rest, fluids, and saline nasal spray   Cough       Rx for Tussionex. Mucinex and lots of fluids    Relevant Orders   DG Chest 2 View       Follow up plan: Return if symptoms worsen or fail to improve.

## 2017-11-05 NOTE — Patient Instructions (Addendum)

## 2017-11-06 LAB — VERITOR FLU A/B WAIVED
INFLUENZA A: NEGATIVE
INFLUENZA B: NEGATIVE

## 2017-11-07 LAB — RAPID STREP SCREEN (MED CTR MEBANE ONLY): STREP GP A AG, IA W/REFLEX: NEGATIVE

## 2017-11-07 LAB — CULTURE, GROUP A STREP: Strep A Culture: NEGATIVE

## 2018-05-26 DIAGNOSIS — M9901 Segmental and somatic dysfunction of cervical region: Secondary | ICD-10-CM | POA: Diagnosis not present

## 2018-05-26 DIAGNOSIS — M7541 Impingement syndrome of right shoulder: Secondary | ICD-10-CM | POA: Diagnosis not present

## 2018-05-26 DIAGNOSIS — M542 Cervicalgia: Secondary | ICD-10-CM | POA: Diagnosis not present

## 2018-05-29 DIAGNOSIS — M7541 Impingement syndrome of right shoulder: Secondary | ICD-10-CM | POA: Diagnosis not present

## 2018-05-29 DIAGNOSIS — M9901 Segmental and somatic dysfunction of cervical region: Secondary | ICD-10-CM | POA: Diagnosis not present

## 2018-05-29 DIAGNOSIS — M542 Cervicalgia: Secondary | ICD-10-CM | POA: Diagnosis not present

## 2018-06-02 DIAGNOSIS — M9901 Segmental and somatic dysfunction of cervical region: Secondary | ICD-10-CM | POA: Diagnosis not present

## 2018-06-02 DIAGNOSIS — M7541 Impingement syndrome of right shoulder: Secondary | ICD-10-CM | POA: Diagnosis not present

## 2018-06-02 DIAGNOSIS — M542 Cervicalgia: Secondary | ICD-10-CM | POA: Diagnosis not present

## 2018-06-03 DIAGNOSIS — M7541 Impingement syndrome of right shoulder: Secondary | ICD-10-CM | POA: Diagnosis not present

## 2018-06-03 DIAGNOSIS — M542 Cervicalgia: Secondary | ICD-10-CM | POA: Diagnosis not present

## 2018-06-03 DIAGNOSIS — M9901 Segmental and somatic dysfunction of cervical region: Secondary | ICD-10-CM | POA: Diagnosis not present

## 2018-06-06 DIAGNOSIS — M7541 Impingement syndrome of right shoulder: Secondary | ICD-10-CM | POA: Diagnosis not present

## 2018-06-06 DIAGNOSIS — M9901 Segmental and somatic dysfunction of cervical region: Secondary | ICD-10-CM | POA: Diagnosis not present

## 2018-06-06 DIAGNOSIS — M542 Cervicalgia: Secondary | ICD-10-CM | POA: Diagnosis not present

## 2018-06-09 DIAGNOSIS — M7541 Impingement syndrome of right shoulder: Secondary | ICD-10-CM | POA: Diagnosis not present

## 2018-06-09 DIAGNOSIS — M542 Cervicalgia: Secondary | ICD-10-CM | POA: Diagnosis not present

## 2018-06-09 DIAGNOSIS — M9901 Segmental and somatic dysfunction of cervical region: Secondary | ICD-10-CM | POA: Diagnosis not present

## 2018-06-10 DIAGNOSIS — M7541 Impingement syndrome of right shoulder: Secondary | ICD-10-CM | POA: Diagnosis not present

## 2018-06-10 DIAGNOSIS — M542 Cervicalgia: Secondary | ICD-10-CM | POA: Diagnosis not present

## 2018-06-10 DIAGNOSIS — M9901 Segmental and somatic dysfunction of cervical region: Secondary | ICD-10-CM | POA: Diagnosis not present

## 2018-07-18 IMAGING — MR MR KNEE*L* W/O CM
4 of 5 series · 31 of 40 positions shown · non-contrast
Comparison: None.

CLINICAL DATA: Left knee pain for 2 months. Prior history of
meniscal surgery.

EXAM:
MRI OF THE LEFT KNEE WITHOUT CONTRAST
TECHNIQUE: Multiplanar, multisequence MR imaging of the knee was performed. No
intravenous contrast was administered.

[Series 6: PD fat-sat · axial · left · 3.0mm · 0.39mm/px · z∈[-41,+81]mm · 9 of 38 slices shown (1 of 3)]
[im 1/38]
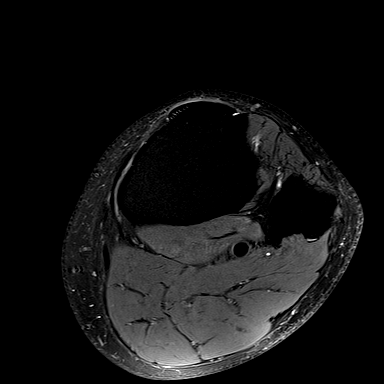
[im 5/38]
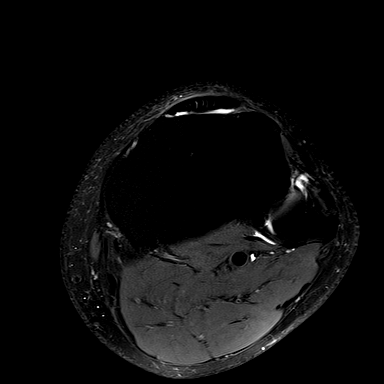
[im 10/38]
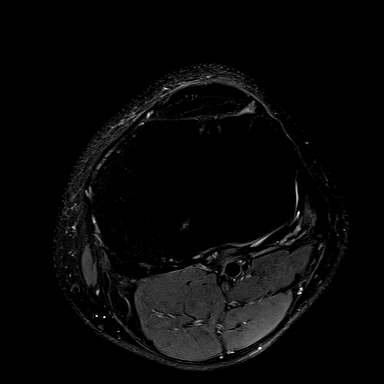
[im 14/38]
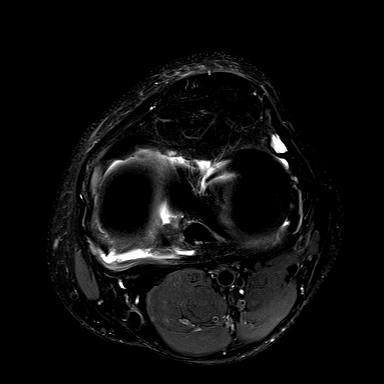
[im 19/38]
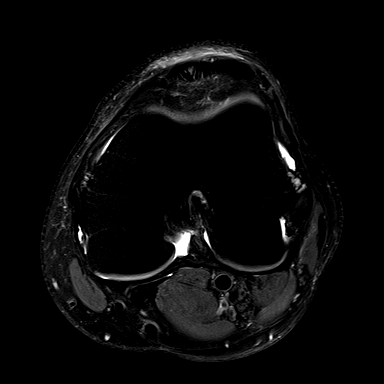
[im 24/38]
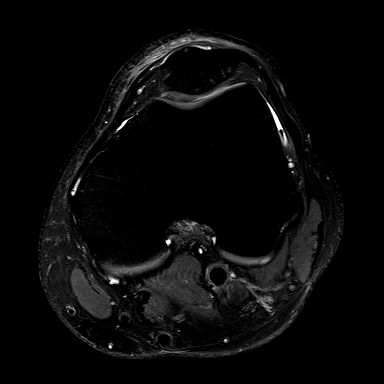
[im 28/38]
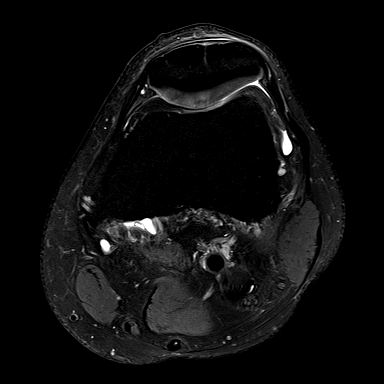
[im 33/38]
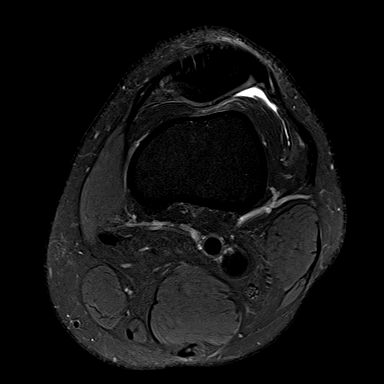
[im 38/38]
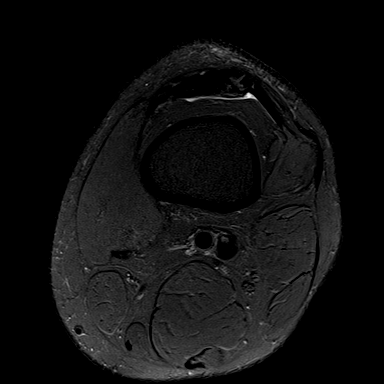

[Series 8: PD fat-sat · coronal · left · 3.0mm · 0.33mm/px · 8 of 35 slices shown (2 of 3)]
[im 1/35]
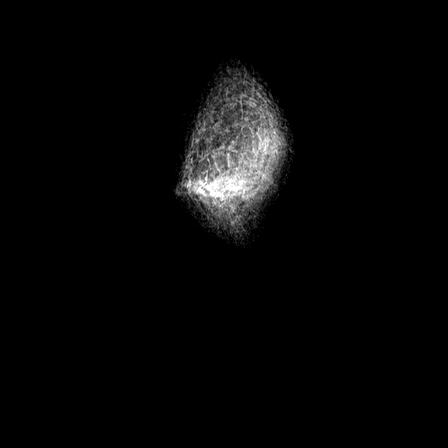
[im 5/35]
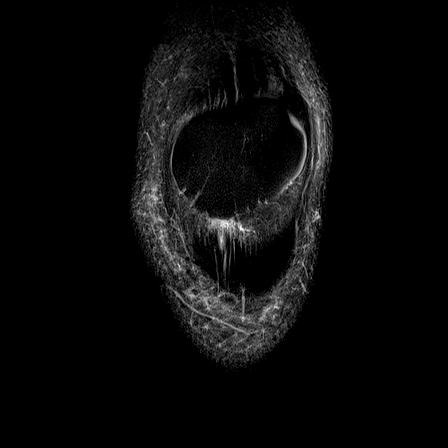
[im 10/35]
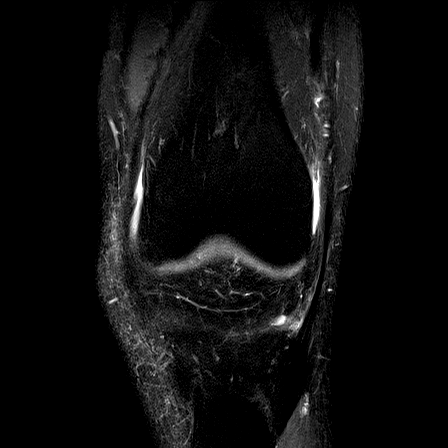
[im 15/35]
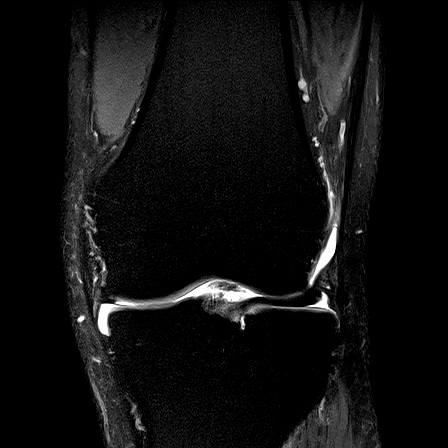
[im 20/35]
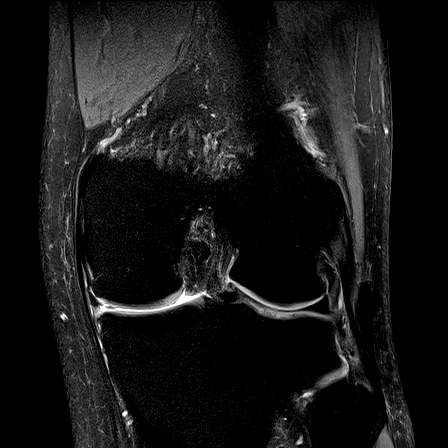
[im 25/35]
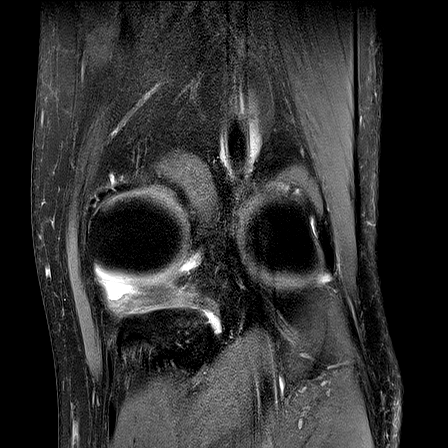
[im 30/35]
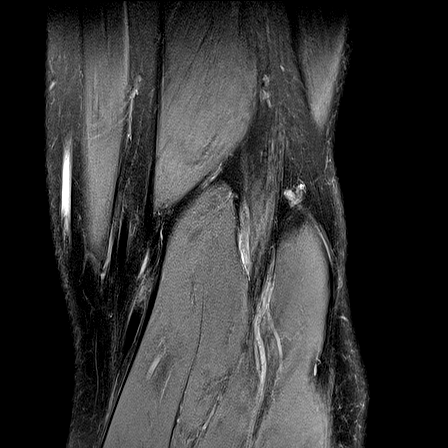
[im 35/35]
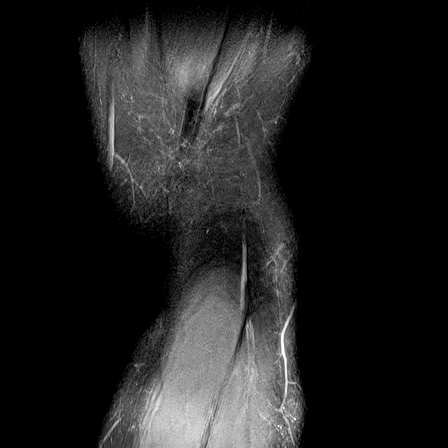

[Series 9: PD fat-sat · sagittal · left · 3.0mm · 0.39mm/px · 7 of 29 slices shown (3 of 3)]
[im 1/29]
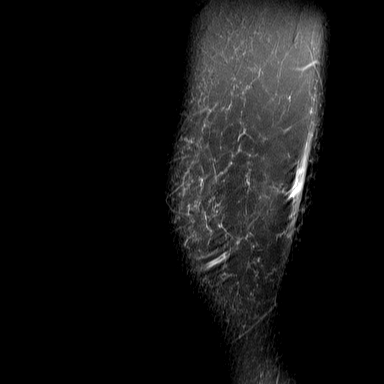
[im 5/29]
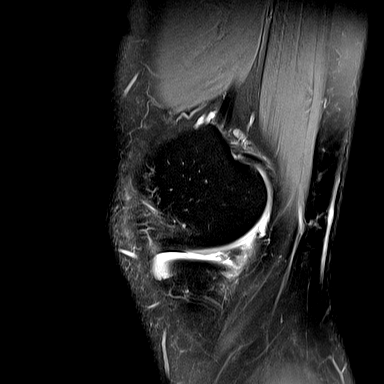
[im 10/29]
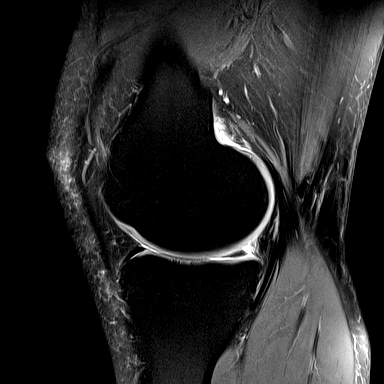
[im 15/29]
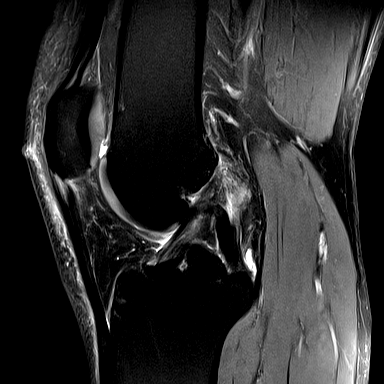
[im 19/29]
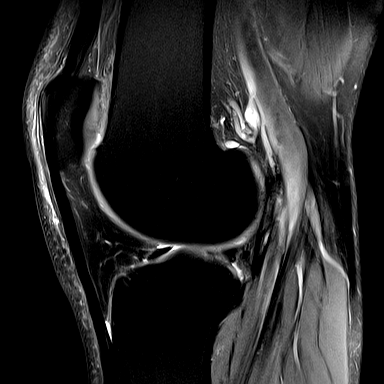
[im 24/29]
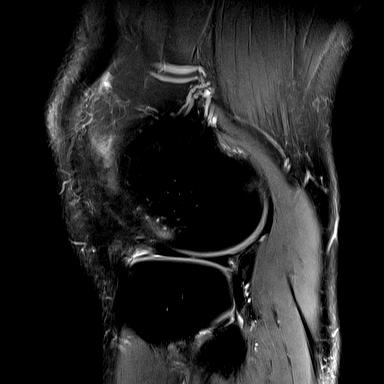
[im 29/29]
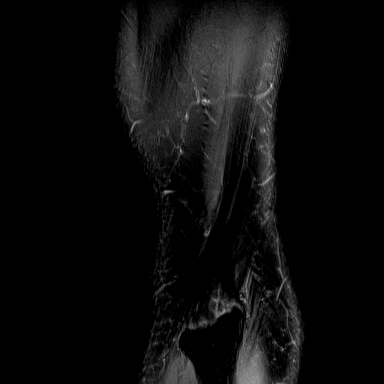

[Series 10: T2 fat-sat · coronal · left · 3.0mm · 0.39mm/px · 7 of 35 slices shown]
[im 1/35]
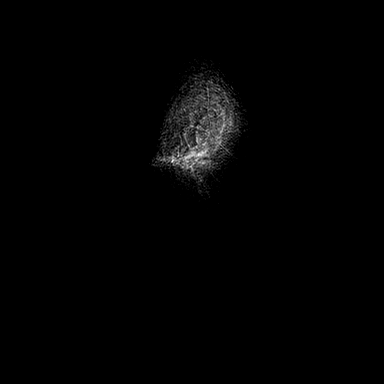
[im 5/35]
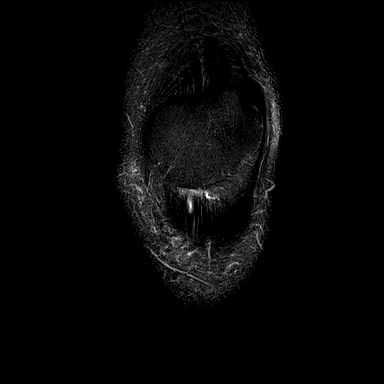
[im 10/35]
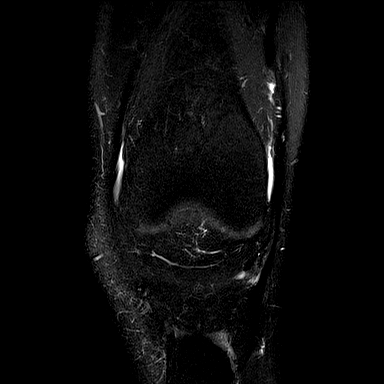
[im 15/35]
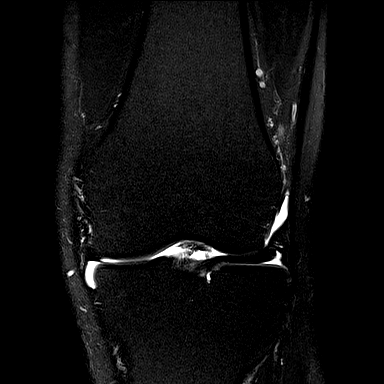
[im 20/35]
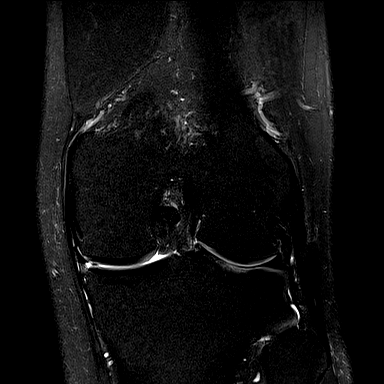
[im 25/35]
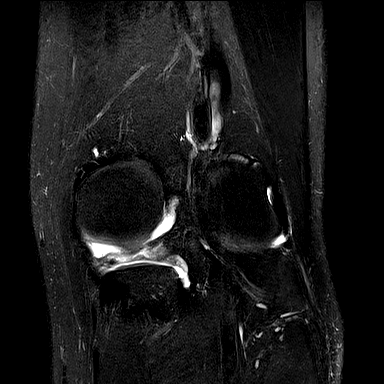
[im 30/35]
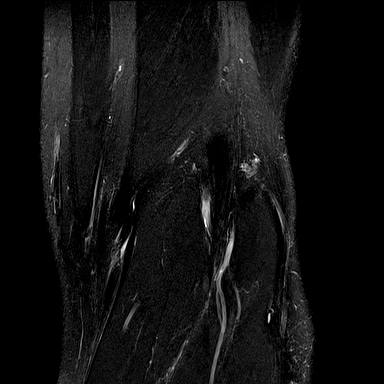

[31 of 40 positions shown; findings below may reference images not displayed]

FINDINGS: MENISCI

Medial meniscus: Evidence of prior substantial partial medial
meniscectomy along the midbody region. There are superior articular
surface tears involving the posterior horn hand free edge tearing at
the posterior horn mid body junction region with a 8 possible small
loose meniscal fragment.

Lateral meniscus: Small focal superior and inferior articular
surface tears involving the posterior horn.

LIGAMENTS

Cruciates:  Intact

Collaterals:  Intact

CARTILAGE

Patellofemoral:  Normal

Medial:  Minimal degenerative chondrosis.

Lateral:  Minimal degenerative chondrosis.

Joint:  No joint effusion or synovitis.

Popliteal Fossa:  No popliteal mass or Baker's cyst.

Extensor Mechanism: The patella retinacular structures are intact.
The quadriceps and patellar tendons are intact. Moderate patellar
tendinopathy with a small focal proximal intrasubstance tear on the
medial aspect.

Bones:  No acute bony findings.

Other: The knee musculature appears normal.
IMPRESSION: 1. Surgical changes involving the medial meniscus with evidence of
recurrent tearing along the posterior horn and mid body junction
region.
2. Small tear involving the posterior horn of the lateral meniscus.
3. Tendinopathy and focal intrasubstance tear involving the proximal
patellar tendon medially.
4. Intact ligamentous structures and no acute bony findings.

## 2018-07-25 ENCOUNTER — Ambulatory Visit (INDEPENDENT_AMBULATORY_CARE_PROVIDER_SITE_OTHER): Payer: 59 | Admitting: Family Medicine

## 2018-07-25 ENCOUNTER — Encounter: Payer: Self-pay | Admitting: Family Medicine

## 2018-07-25 VITALS — BP 117/73 | HR 64 | Temp 97.8°F | Ht 69.1 in | Wt 197.8 lb

## 2018-07-25 DIAGNOSIS — L821 Other seborrheic keratosis: Secondary | ICD-10-CM

## 2018-07-25 DIAGNOSIS — Z113 Encounter for screening for infections with a predominantly sexual mode of transmission: Secondary | ICD-10-CM | POA: Diagnosis not present

## 2018-07-25 DIAGNOSIS — B977 Papillomavirus as the cause of diseases classified elsewhere: Secondary | ICD-10-CM

## 2018-07-25 DIAGNOSIS — Z0000 Encounter for general adult medical examination without abnormal findings: Secondary | ICD-10-CM

## 2018-07-25 DIAGNOSIS — F1021 Alcohol dependence, in remission: Secondary | ICD-10-CM | POA: Diagnosis not present

## 2018-07-25 DIAGNOSIS — L308 Other specified dermatitis: Secondary | ICD-10-CM

## 2018-07-25 LAB — UA/M W/RFLX CULTURE, ROUTINE
Bilirubin, UA: NEGATIVE
GLUCOSE, UA: NEGATIVE
Ketones, UA: NEGATIVE
Leukocytes, UA: NEGATIVE
Nitrite, UA: NEGATIVE
PH UA: 7 (ref 5.0–7.5)
PROTEIN UA: NEGATIVE
RBC UA: NEGATIVE
SPEC GRAV UA: 1.015 (ref 1.005–1.030)
Urobilinogen, Ur: 1 mg/dL (ref 0.2–1.0)

## 2018-07-25 MED ORDER — IMIQUIMOD 5 % EX CREA: TOPICAL_CREAM | CUTANEOUS | 1 refills | Status: DC

## 2018-07-25 NOTE — Patient Instructions (Signed)
Follow up for CPE in 1 year

## 2018-07-25 NOTE — Progress Notes (Signed)
BP 117/73   Pulse 64   Temp 97.8 F (36.6 C) (Oral)   Ht 5' 9.1" (1.755 m)   Wt 197 lb 12.8 oz (89.7 kg)   SpO2 97%   BMI 29.13 kg/m    Subjective:    Patient ID: Eric Kline, male    DOB: October 12, 1981, 37 y.o.   MRN: [id number]  HPI: Eric Kline is a 37 y.o. male presenting on 07/25/2018 for comprehensive medical examination. Current medical complaints include:see below  Hx of alcoholism, 6 months sober. Has a sponsor and is active in [place]. Feeling really well now that the initial period of withdrawal is over.   Has a spot on right hand that he's worried about, has been growing and changing the past few months. 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 No bleeding, flaking, pain, itching.   Eczema stable, using moisturizers twice daily. Only really has flares at season changes and states they are not severe.   Started having a genital warts flare about 2 weeks ago and needs more aldara.   Depression Screen done today and results listed below:  Depression screen [facility] 2/9 07/25/2018 07/25/2018 01/15/2017  Decreased Interest 0 0 1  Down, Depressed, Hopeless 0 0 1  PHQ - 2 Score 0 0 2  Altered sleeping 1 0 -  Tired, decreased energy 0 0 -  Change in appetite 0 0 -  Feeling bad or failure about yourself  0 0 -  Trouble concentrating 0 0 -  Moving slowly or fidgety/restless 0 0 -  Suicidal thoughts 0 0 -  PHQ-9 Score 1 0 -    The patient does not have a history of falls. I did not complete a risk assessment for falls. A plan of care for falls was not documented.   Past Medical History:  Past Medical History:  Diagnosis Date  . Allergy   . Concussion    x3    Surgical History:  Past Surgical History:  Procedure Laterality Date  . MENISCUS REPAIR  2002   left     Medications:  No current outpatient medications on file prior to visit.   No current facility-administered medications on file prior to visit.     Allergies:  No Known  Allergies  Social History:  Social History   Socioeconomic History  . Marital status: Single    Spouse name: Not on file  . Number of children: Not on file  . Years of education: Not on file  . Highest education level: Not on file  Occupational History  . Not on file  Social Needs  . Financial resource strain: Not on file  . Food insecurity:    Worry: Not on file    Inability: Not on file  . Transportation needs:    Medical: Not on file    Non-medical: Not on file  Tobacco Use  . Smoking status: Former Research scientist (life sciences)  . Smokeless tobacco: Former Systems developer    Types: Chew  Substance and Sexual Activity  . Alcohol use: No    Comment: pt states he has not drank in the last 5 months   . 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 Sexual activity: Not Currently  Lifestyle  . Physical activity:    Days per week: Not on file    Minutes per session: Not on file  . Stress: Not on file  Relationships  . Social connections:    Talks on phone: Not on file    Gets together: Not on file    Attends religious service: Not on file    Active member of club or organization: Not on file    Attends meetings of clubs or organizations: Not on file    Relationship status: Not on file  . Intimate partner violence:    Fear of current or ex partner: Not on file    Emotionally abused: Not on file    Physically abused: Not on file    Forced sexual activity: Not on file  Other Topics Concern  . Not on file  Social History Narrative  . Not on file   Social History   Tobacco Use  Smoking Status Former Smoker  Smokeless Tobacco Former Systems developer  . Types: Chew   Social History   Substance and Sexual Activity  Alcohol Use No   Comment: pt states he has not drank in the last 5 months     Family History:  Family History  Problem Relation Age of Onset  . Cancer Mother        skin  . Alzheimer's disease Maternal Grandfather     Past medical  history, surgical history, medications, allergies, family history and social history reviewed with patient today and changes made to appropriate areas of the chart.   Review of Systems - General ROS: negative Psychological ROS: negative Ophthalmic ROS: negative ENT ROS: negative Allergy and Immunology ROS: negative Hematological and Lymphatic ROS: negative Endocrine ROS: negative Respiratory ROS: no cough, shortness of breath, or wheezing Cardiovascular ROS: no chest pain or dyspnea on exertion Gastrointestinal ROS: no abdominal pain, change in bowel habits, or black or bloody stools Genito-Urinary ROS: no dysuria, trouble voiding, or hematuria Musculoskeletal ROS: negative Neurological ROS: no TIA or stroke symptoms Dermatological ROS: positive for skin lesion changes All other ROS negative except what is listed above and in the HPI.      Objective:    BP 117/73   Pulse 64   Temp 97.8 F (36.6 C) (Oral)   Ht 5' 9.1" (1.755 m)   Wt 197 lb 12.8 oz (89.7 kg)   SpO2 97%   BMI 29.13 kg/m   Wt Readings from Last 3 Encounters:  07/25/18 197 lb 12.8 oz (89.7 kg)  11/05/17 205 lb 3.2 oz (93.1 kg)  05/22/17 200 lb (90.7 kg)    Physical Exam  Constitutional: He is oriented to person, place, and time. He appears well-developed and well-nourished. No distress.  HENT:  Head: Atraumatic.  Right Ear: External ear normal.  Left Ear: External ear normal.  Nose: Nose normal.  Mouth/Throat: Oropharynx is clear and moist.  Eyes: Pupils are equal, round, and reactive to light. Conjunctivae are normal. No scleral icterus.  Neck: Normal range of motion. Neck supple.  Cardiovascular: Normal rate, regular rhythm, normal heart sounds and intact distal pulses.  No murmur heard. Pulmonary/Chest: Effort normal and breath sounds normal. No respiratory distress.  Abdominal: Soft. Bowel sounds are normal. He exhibits no distension and no mass. There is no tenderness. There is no guarding.    Genitourinary:  Genitourinary Comments: Exam declined  Musculoskeletal: Normal range of motion. He exhibits no edema or tenderness.  Neurological: He is alert and oriented to person, place, and time. He has normal reflexes.  Skin: Skin is warm and dry. No rash noted.  Psychiatric: He has a normal mood and affect. His behavior is normal.  Nursing  note and vitals reviewed.   Results for orders placed or performed in visit on 11/05/17  Rapid Strep Screen (Not at [facility])  Result Value Ref Range   Strep Gp A Ag, IA W/Reflex Negative Negative  Culture, Group A Strep  Result Value Ref Range   Strep A Culture Negative   Veritor Flu A/B Waived  Result Value Ref Range   Influenza A Negative Negative   Influenza B Negative Negative      Assessment & Plan:   Problem List Items Addressed This Visit      Musculoskeletal and Integument   Eczema    Stable on regular moisturizer regimen. Continue current regimen        Other   HPV (human papilloma virus) infection    Aldara refilled      Relevant Medications   imiquimod (ALDARA) 5 % cream   History of alcoholism ([place])    Currently 6 months sober and feeling very well. Continue AA       Other Visit Diagnoses    Seborrheic keratosis    -  Primary   Reassurance given, will continue to monitor. Offered bx for peace of mind or cosmesis, pt declines   Annual physical exam       Relevant Orders   CBC with Differential/Platelet   Comprehensive metabolic panel   Lipid Panel w/o Chol/HDL Ratio   UA/M w/rflx Culture, Routine   Routine screening for STI (sexually transmitted infection)       Relevant Orders   HIV Antibody (routine testing w rflx)   RPR   HSV(herpes simplex vrs) 1+2 ab-IgG   GC/Chlamydia Probe Amp       Discussed aspirin prophylaxis for myocardial infarction prevention and decision was it was not indicated  LABORATORY TESTING:  Health maintenance labs ordered today as discussed above.   The natural history of  prostate cancer and ongoing controversy regarding screening and potential treatment outcomes of prostate cancer has been discussed with the patient. The meaning of a false positive PSA and a false negative PSA has been discussed. He indicates understanding of the limitations of this screening test and wishes not to proceed with screening PSA testing.   IMMUNIZATIONS:   - Tdap: Tetanus vaccination status reviewed: last tetanus booster within 10 years. - Influenza: Refused  PATIENT COUNSELING:    Sexuality: Discussed sexually transmitted diseases, partner selection, use of condoms, avoidance of unintended pregnancy  and contraceptive alternatives.   Advised to avoid cigarette smoking.  I discussed with the patient that most people either abstain from alcohol or drink within safe limits (<=14/week and <=4 drinks/occasion for males, <=7/weeks and <= 3 drinks/occasion for females) and that the risk for alcohol disorders and other health effects rises proportionally with the number of drinks per week and how often a drinker exceeds daily limits.  Discussed cessation/primary prevention of drug use and availability of treatment for abuse.   Diet: Encouraged to adjust caloric intake to maintain  or achieve ideal body weight, to reduce intake of dietary saturated fat and total fat, to limit sodium intake by avoiding high sodium foods and not adding table salt, and to maintain adequate dietary potassium and calcium preferably from fresh fruits, vegetables, and low-fat dairy products.    stressed the importance of regular exercise  Injury prevention: Discussed safety belts, safety helmets, smoke detector, smoking near bedding or upholstery.   Dental health: Discussed importance of regular tooth brushing, flossing, and dental visits.   Follow up plan: NEXT PREVENTATIVE  PHYSICAL DUE IN 1 YEAR. Return in about 1 year (around 07/26/2019) for CPE.

## 2018-07-25 NOTE — Assessment & Plan Note (Signed)
Aldara refilled 

## 2018-07-25 NOTE — Assessment & Plan Note (Signed)
Stable on regular moisturizer regimen. Continue current regimen

## 2018-07-25 NOTE — Assessment & Plan Note (Signed)
Currently 6 months sober and feeling very well. Continue AA

## 2018-07-26 ENCOUNTER — Encounter: Payer: Self-pay | Admitting: Family Medicine

## 2018-07-26 LAB — COMPREHENSIVE METABOLIC PANEL
A/G RATIO: 2.9 — AB (ref 1.2–2.2)
ALBUMIN: 4.4 g/dL (ref 3.5–5.5)
ALK PHOS: 62 IU/L (ref 39–117)
ALT: 17 IU/L (ref 0–44)
AST: 14 IU/L (ref 0–40)
BUN / CREAT RATIO: 9 (ref 9–20)
BUN: 10 mg/dL (ref 6–20)
Bilirubin Total: 0.7 mg/dL (ref 0.0–1.2)
CALCIUM: 9 mg/dL (ref 8.7–10.2)
CO2: 24 mmol/L (ref 20–29)
Chloride: 105 mmol/L (ref 96–106)
Creatinine, Ser: 1.08 mg/dL (ref 0.76–1.27)
GFR calc Af Amer: 99 mL/min/{1.73_m2} (ref >59)
GFR, EST NON AFRICAN AMERICAN: 86 mL/min/{1.73_m2} (ref >59)
Globulin, Total: 1.5 g/dL (ref 1.5–4.5)
Glucose: 81 mg/dL (ref 65–99)
POTASSIUM: 4.2 mmol/L (ref 3.5–5.2)
SODIUM: 145 mmol/L — AB (ref 134–144)
Total Protein: 5.9 g/dL — ABNORMAL LOW (ref 6.0–8.5)

## 2018-07-26 LAB — CBC WITH DIFFERENTIAL/PLATELET
BASOS ABS: 0 10*3/uL (ref 0.0–0.2)
Basos: 1 %
EOS (ABSOLUTE): 0.2 10*3/uL (ref 0.0–0.4)
Eos: 2 %
HEMOGLOBIN: 15.1 g/dL (ref 13.0–17.7)
Hematocrit: 43.1 % (ref 37.5–51.0)
IMMATURE GRANS (ABS): 0 10*3/uL (ref 0.0–0.1)
IMMATURE GRANULOCYTES: 0 %
LYMPHS: 32 %
Lymphocytes Absolute: 2.5 10*3/uL (ref 0.7–3.1)
MCH: 32 pg (ref 26.6–33.0)
MCHC: 35 g/dL (ref 31.5–35.7)
MCV: 91 fL (ref 79–97)
MONOCYTES: 5 %
Monocytes Absolute: 0.4 10*3/uL (ref 0.1–0.9)
Neutrophils Absolute: 4.7 10*3/uL (ref 1.4–7.0)
Neutrophils: 60 %
Platelets: 283 10*3/uL (ref 150–450)
RBC: 4.72 x10E6/uL (ref 4.14–5.80)
RDW: 12.6 % (ref 12.3–15.4)
WBC: 7.9 10*3/uL (ref 3.4–10.8)

## 2018-07-26 LAB — LIPID PANEL W/O CHOL/HDL RATIO
Cholesterol, Total: 181 mg/dL (ref 100–199)
HDL: 31 mg/dL — ABNORMAL LOW (ref >39)
LDL Calculated: 110 mg/dL — ABNORMAL HIGH (ref 0–99)
TRIGLYCERIDES: 201 mg/dL — AB (ref 0–149)
VLDL Cholesterol Cal: 40 mg/dL (ref 5–40)

## 2018-07-26 LAB — RPR: RPR Ser Ql: NONREACTIVE

## 2018-07-26 LAB — HIV ANTIBODY (ROUTINE TESTING W REFLEX): HIV SCREEN 4TH GENERATION: NONREACTIVE

## 2018-07-26 LAB — HSV(HERPES SIMPLEX VRS) I + II AB-IGG: HSV 1 Glycoprotein G Ab, IgG: 1.33 index — ABNORMAL HIGH (ref 0.00–0.90)

## 2018-07-28 LAB — GC/CHLAMYDIA PROBE AMP
Chlamydia trachomatis, NAA: NEGATIVE
NEISSERIA GONORRHOEAE BY PCR: NEGATIVE

## 2018-11-07 ENCOUNTER — Encounter: Payer: Self-pay | Admitting: *Deleted

## 2018-11-07 ENCOUNTER — Encounter: Payer: Self-pay | Admitting: Nurse Practitioner

## 2018-11-07 ENCOUNTER — Ambulatory Visit: Payer: 59 | Admitting: Nurse Practitioner

## 2018-11-07 VITALS — BP 135/78 | HR 94 | Temp 99.0°F | Ht 69.1 in | Wt 201.1 lb

## 2018-11-07 DIAGNOSIS — J069 Acute upper respiratory infection, unspecified: Secondary | ICD-10-CM | POA: Diagnosis not present

## 2018-11-07 DIAGNOSIS — R198 Other specified symptoms and signs involving the digestive system and abdomen: Secondary | ICD-10-CM | POA: Diagnosis not present

## 2018-11-07 LAB — VERITOR FLU A/B WAIVED
INFLUENZA A: NEGATIVE
Influenza B: NEGATIVE

## 2018-11-07 NOTE — Patient Instructions (Signed)
Hemorrhoids Hemorrhoids are swollen veins that may develop:  In the butt (rectum). These are called internal hemorrhoids.  Around the opening of the butt (anus). These are called external hemorrhoids. Hemorrhoids can cause pain, itching, or bleeding. Most of the time, they do not cause serious problems. They usually get better with diet changes, lifestyle changes, and other home treatments. What are the causes? This condition may be caused by:  Having trouble pooping (constipation).  Pushing hard (straining) to poop.  Watery poop (diarrhea).  Pregnancy.  Being very overweight (obese).  Sitting for long periods of time.  Heavy lifting or other activity that causes you to strain.  Anal sex.  Riding a bike for a long period of time. What are the signs or symptoms? Symptoms of this condition include:  Pain.  Itching or soreness in the butt.  Bleeding from the butt.  Leaking poop.  Swelling in the area.  One or more lumps around the opening of your butt. How is this diagnosed? A doctor can often diagnose this condition by looking at the affected area. The doctor may also:  Do an exam that involves feeling the area with a gloved hand (digital rectal exam).  Examine the area inside your butt using a small tube (anoscope).  Order blood tests. This may be done if you have lost a lot of blood.  Have you get a test that involves looking inside the colon using a flexible tube with a camera on the end (sigmoidoscopy or colonoscopy). How is this treated? This condition can usually be treated at home. Your doctor may tell you to change what you eat, make lifestyle changes, or try home treatments. If these do not help, procedures can be done to remove the hemorrhoids or make them smaller. These may involve:  Placing rubber bands at the base of the hemorrhoids to cut off their blood supply.  Injecting medicine into the hemorrhoids to shrink them.  Shining a type of light  energy onto the hemorrhoids to cause them to fall off.  Doing surgery to remove the hemorrhoids or cut off their blood supply. Follow these instructions at home: Eating and drinking   Eat foods that have a lot of fiber in them. These include whole grains, beans, nuts, fruits, and vegetables.  Ask your doctor about taking products that have added fiber (fibersupplements).  Reduce the amount of fat in your diet. You can do this by: ? Eating low-fat dairy products. ? Eating less red meat. ? Avoiding processed foods.  Drink enough fluid to keep your pee (urine) pale yellow. Managing pain and swelling   Take a warm-water bath (sitz bath) for 20 minutes to ease pain. Do this 3-4 times a day. You may do this in a bathtub or using a portable sitz bath that fits over the toilet.  If told, put ice on the painful area. It may be helpful to use ice between your warm baths. ? Put ice in a plastic bag. ? Place a towel between your skin and the bag. ? Leave the ice on for 20 minutes, 2-3 times a day. General instructions  Take over-the-counter and prescription medicines only as told by your doctor. ? Medicated creams and medicines may be used as told.  Exercise often. Ask your doctor how much and what kind of exercise is best for you.  Go to the bathroom when you have the urge to poop. Do not wait.  Avoid pushing too hard when you poop.  Keep your   butt dry and clean. Use wet toilet paper or moist towelettes after pooping.  Do not sit on the toilet for a long time.  Keep all follow-up visits as told by your doctor. This is important. Contact a doctor if you:  Have pain and swelling that do not get better with treatment or medicine.  Have trouble pooping.  Cannot poop.  Have pain or swelling outside the area of the hemorrhoids. Get help right away if you have:  Bleeding that will not stop. Summary  Hemorrhoids are swollen veins in the butt or around the opening of the butt.   They can cause pain, itching, or bleeding.  Eat foods that have a lot of fiber in them. These include whole grains, beans, nuts, fruits, and vegetables.  Take a warm-water bath (sitz bath) for 20 minutes to ease pain. Do this 3-4 times a day. This information is not intended to replace advice given to you by your health care provider. Make sure you discuss any questions you have with your health care provider. Document Released: 07/17/2008 Document Revised: 02/27/2018 Document Reviewed: 02/27/2018 Elsevier Interactive Patient Education  2019 [employer]  

## 2018-11-07 NOTE — Assessment & Plan Note (Signed)
Present x one month.  No external hemorrhoids noted.  CBC, anemia panel, CMP, and iFOBT (sent home with patient to return to office) obtained.  GI referral for further assessment.  Return in 4 weeks.  If anemia noted on labs will initiate Prilosec and Carafate.

## 2018-11-07 NOTE — Progress Notes (Signed)
BP 135/78 (BP Location: Left Arm, Patient Position: Sitting, Cuff Size: Large)   Pulse 94   Temp 99 F (37.2 C)   Ht 5' 9.1" (1.755 m)   Wt 201 lb 2 oz (91.2 kg)   SpO2 96%   BMI 29.62 kg/m    Subjective:    Patient ID: Eric Kline, male    DOB: 13-Feb-1981, 38 y.o.   MRN: [id number]  HPI: Eric Kline is a 38 y.o. male  Chief Complaint  Patient presents with  . Hemorrhoids  . URI    started Wednesday, bodyache fatigue headache   UPPER RESPIRATORY TRACT INFECTION Started Wednesday afternoon with body aches and fatigue.  Flu testing negative today.   Worst symptom: fatigue Fever: yes Cough: yes Shortness of breath: no Wheezing: no Chest pain: no Chest tightness: no Chest congestion: no Nasal congestion: yes Runny nose: no Post nasal drip: no Sneezing: no Sore throat: no Swollen glands: no Sinus pressure: no Headache: yes Face pain: no Toothache: no Ear pain: none Ear pressure: yes bilateral Eyes red/itching:no Eye drainage/crusting: no  Vomiting: no Rash: no Fatigue: yes Sick contacts: yes Strep contacts: no  Context: fluctuating Recurrent sinusitis: no Relief with OTC cold/cough medications: yes  With Theraflu Treatments attempted: Theraflu and cold/sinus   HEMORRHOIDS: Started to notice a few weeks ago with symptoms of not being able to fully eliminate, tenesmus present.  Denies pain or pruritus with having bowel movement.  Small amount of bright red blood noticed on toilet paper with wiping and none in toilet, has noticed this a couple times.  Has been using Preparation H at home and symptoms continue.  No history of colon cancer in family.  At baseline has BM 1-2 times a day.  Denies straining or difficulty passing stool.  States stools are formed, not loose.  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  He reports he "tries to be" physically active.  Denies abdominal pain, heart burn, or N&V.  Health  history includes history of alcoholism, former smoker, and h/o HPV infection.  Relevant past medical, surgical, family and social history reviewed and updated as indicated. Interim medical history since our last visit reviewed. Allergies and medications reviewed and updated.  Review of Systems  Constitutional: Negative for activity change, diaphoresis, fatigue and fever.  Respiratory: Negative for cough, chest tightness, shortness of breath and wheezing.   Cardiovascular: Negative for chest pain, palpitations and leg swelling.  Gastrointestinal: Positive for blood in stool (x 2 on tissue noted). Negative for abdominal distention, abdominal pain, constipation, diarrhea, nausea, rectal pain and vomiting.       Tenesmus  Endocrine: Negative for cold intolerance, heat intolerance, polydipsia, polyphagia and polyuria.  Musculoskeletal: Negative.   Skin: Negative.   Neurological: Negative for dizziness, syncope, weakness, light-headedness, numbness and headaches.  Psychiatric/Behavioral: Negative.     Per HPI unless specifically indicated above     Objective:    BP 135/78 (BP Location: Left Arm, Patient Position: Sitting, Cuff Size: Large)   Pulse 94   Temp 99 F (37.2 C)   Ht 5' 9.1" (1.755 m)   Wt 201 lb 2 oz (91.2 kg)   SpO2 96%   BMI 29.62 kg/m   Wt Readings from Last 3 Encounters:  11/07/18 201 lb 2 oz (91.2 kg)  07/25/18 197 lb 12.8 oz (89.7 kg)  11/05/17 205 lb 3.2 oz (93.1 kg)    Physical Exam Vitals signs and  nursing note reviewed. Chaperone present: Patient reports no chaperone needed during exam.  Constitutional:      General: He is awake.     Appearance: He is well-developed. He is not ill-appearing.  HENT:     Head: Normocephalic and atraumatic.     Right Ear: Hearing, tympanic membrane, ear canal and external ear normal. No drainage.     Left Ear: Hearing, tympanic membrane, ear canal and external ear normal. No drainage.     Nose: No mucosal edema or rhinorrhea.      Right Sinus: No maxillary sinus tenderness or frontal sinus tenderness.     Left Sinus: No maxillary sinus tenderness or frontal sinus tenderness.     Mouth/Throat:     Mouth: Mucous membranes are moist.     Pharynx: Uvula midline. Posterior oropharyngeal erythema (mild with cobblestoning) present. No pharyngeal swelling or oropharyngeal exudate.     Tonsils: Swelling: 0 on the right. 0 on the left.  Eyes:     General: Lids are normal.        Right eye: No discharge.        Left eye: No discharge.     Conjunctiva/sclera: Conjunctivae normal.     Pupils: Pupils are equal, round, and reactive to light.  Neck:     Musculoskeletal: Normal range of motion and neck supple.     Thyroid: No thyromegaly.     Vascular: No carotid bruit or JVD.     Trachea: Trachea normal.  Cardiovascular:     Rate and Rhythm: Normal rate and regular rhythm.     Heart sounds: Normal heart sounds, S1 normal and S2 normal. No murmur. No gallop.   Pulmonary:     Effort: Pulmonary effort is normal.     Breath sounds: Normal breath sounds.  Abdominal:     General: Abdomen is flat. Bowel sounds are normal.     Palpations: Abdomen is soft. There is no hepatomegaly or splenomegaly.     Tenderness: There is no abdominal tenderness.     Hernia: No hernia is present.  Genitourinary:    Penis: Normal.      Rectum: Normal. No tenderness or external hemorrhoid.     Comments: No external hemorrhoids.  No blood on glove noted with internal exam.   Musculoskeletal: Normal range of motion.  Skin:    General: Skin is warm and dry.     Capillary Refill: Capillary refill takes less than 2 seconds.     Findings: No rash.  Neurological:     Mental Status: He is alert and oriented to person, place, and time.     Deep Tendon Reflexes: Reflexes are normal and symmetric.  Psychiatric:        Behavior: Behavior normal. Behavior is cooperative.        Thought Content: Thought content normal.        Judgment: Judgment normal.      Results for orders placed or performed in visit on 07/25/18  GC/Chlamydia Probe Amp  Result Value Ref Range   Chlamydia trachomatis, NAA Negative Negative   Neisseria gonorrhoeae by PCR Negative Negative  CBC with Differential/Platelet  Result Value Ref Range   WBC 7.9 3.4 - 10.8 x10E3/uL   RBC 4.72 4.14 - 5.80 x10E6/uL   Hemoglobin 15.1 13.0 - 17.7 g/dL   Hematocrit 43.1 37.5 - 51.0 %   MCV 91 79 - 97 fL   MCH 32.0 26.6 - 33.0 pg   MCHC 35.0 31.5 - 35.7  g/dL   RDW 12.6 12.3 - 15.4 %   Platelets 283 150 - 450 x10E3/uL   Neutrophils 60 Not Estab. %   Lymphs 32 Not Estab. %   Monocytes 5 Not Estab. %   Eos 2 Not Estab. %   Basos 1 Not Estab. %   Neutrophils Absolute 4.7 1.4 - 7.0 x10E3/uL   Lymphocytes Absolute 2.5 0.7 - 3.1 x10E3/uL   Monocytes Absolute 0.4 0.1 - 0.9 x10E3/uL   EOS (ABSOLUTE) 0.2 0.0 - 0.4 x10E3/uL   Basophils Absolute 0.0 0.0 - 0.2 x10E3/uL   Immature Granulocytes 0 Not Estab. %   Immature Grans (Abs) 0.0 0.0 - 0.1 x10E3/uL  Comprehensive metabolic panel  Result Value Ref Range   Glucose 81 65 - 99 mg/dL   BUN 10 6 - 20 mg/dL   Creatinine, Ser 1.08 0.76 - 1.27 mg/dL   GFR calc non Af Amer 86 >59 mL/min/1.73   GFR calc Af Amer 99 >59 mL/min/1.73   BUN/Creatinine Ratio 9 9 - 20   Sodium 145 (H) 134 - 144 mmol/L   Potassium 4.2 3.5 - 5.2 mmol/L   Chloride 105 96 - 106 mmol/L   CO2 24 20 - 29 mmol/L   Calcium 9.0 8.7 - 10.2 mg/dL   Total Protein 5.9 (L) 6.0 - 8.5 g/dL   Albumin 4.4 3.5 - 5.5 g/dL   Globulin, Total 1.5 1.5 - 4.5 g/dL   Albumin/Globulin Ratio 2.9 (H) 1.2 - 2.2   Bilirubin Total 0.7 0.0 - 1.2 mg/dL   Alkaline Phosphatase 62 39 - 117 IU/L   AST 14 0 - 40 IU/L   ALT 17 0 - 44 IU/L  Lipid Panel w/o Chol/HDL Ratio  Result Value Ref Range   Cholesterol, Total 181 100 - 199 mg/dL   Triglycerides 201 (H) 0 - 149 mg/dL   HDL 31 (L) >39 mg/dL   VLDL Cholesterol Cal 40 5 - 40 mg/dL   LDL Calculated 110 (H) 0 - 99 mg/dL  UA/M w/rflx  Culture, Routine  Result Value Ref Range   Specific Gravity, UA 1.015 1.005 - 1.030   pH, UA 7.0 5.0 - 7.5   Color, UA Orange Yellow   Appearance Ur Clear Clear   Leukocytes, UA Negative Negative   Protein, UA Negative Negative/Trace   Glucose, UA Negative Negative   Ketones, UA Negative Negative   RBC, UA Negative Negative   Bilirubin, UA Negative Negative   Urobilinogen, Ur 1.0 0.2 - 1.0 mg/dL   Nitrite, UA Negative Negative  HIV Antibody (routine testing w rflx)  Result Value Ref Range   HIV Screen 4th Generation wRfx Non Reactive Non Reactive  RPR  Result Value Ref Range   RPR Ser Ql Non Reactive Non Reactive  HSV(herpes simplex vrs) 1+2 ab-IgG  Result Value Ref Range   HSV 1 Glycoprotein G Ab, IgG 1.33 (H) 0.00 - 0.90 index   HSV 2 IgG, Type Spec <0.91 0.00 - 0.90 index      Assessment & Plan:   Problem List Items Addressed This Visit      Respiratory   RESOLVED: Upper respiratory infection - Primary   Relevant Orders   Veritor Flu A/B Waived     Other   Tenesmus    Present x one month.  No external hemorrhoids noted.  CBC, anemia panel, CMP, and iFOBT (sent home with patient to return to office) obtained.  GI referral for further assessment.  Return in 4 weeks.  If anemia  noted on labs will initiate Prilosec and Carafate.        Relevant Orders   CBC w/Diff   Comp Met (CMET)   Anemia panel   Occult blood card to lab, stool   Ambulatory referral to Gastroenterology       Follow up plan: Return in about 4 weeks (around 12/05/2018) for Tenesmus.

## 2018-11-10 ENCOUNTER — Telehealth: Payer: Self-pay | Admitting: Nurse Practitioner

## 2018-11-10 LAB — ANEMIA PANEL
FOLATE, RBC: 1203 ng/mL (ref >498)
Ferritin: 190 ng/mL (ref 30–400)
Folate, Hemolysate: 475 ng/mL
Hematocrit: 39.5 % (ref 37.5–51.0)
IRON SATURATION: 9 % — AB (ref 15–55)
Iron: 18 ug/dL — ABNORMAL LOW (ref 38–169)
RETIC CT PCT: 0.6 % (ref 0.6–2.6)
TIBC: 205 ug/dL — AB (ref 250–450)
UIBC: 187 ug/dL (ref 111–343)
Vitamin B-12: 357 pg/mL (ref 232–1245)

## 2018-11-10 LAB — COMPREHENSIVE METABOLIC PANEL
ALK PHOS: 56 IU/L (ref 39–117)
ALT: 18 IU/L (ref 0–44)
AST: 15 IU/L (ref 0–40)
Albumin/Globulin Ratio: 2.4 — ABNORMAL HIGH (ref 1.2–2.2)
Albumin: 4 g/dL (ref 3.5–5.5)
BILIRUBIN TOTAL: 0.4 mg/dL (ref 0.0–1.2)
BUN/Creatinine Ratio: 5 — ABNORMAL LOW (ref 9–20)
BUN: 6 mg/dL (ref 6–20)
CHLORIDE: 105 mmol/L (ref 96–106)
CO2: 21 mmol/L (ref 20–29)
Calcium: 8.3 mg/dL — ABNORMAL LOW (ref 8.7–10.2)
Creatinine, Ser: 1.1 mg/dL (ref 0.76–1.27)
GFR calc Af Amer: 97 mL/min/{1.73_m2} (ref >59)
GFR calc non Af Amer: 84 mL/min/{1.73_m2} (ref >59)
GLUCOSE: 121 mg/dL — AB (ref 65–99)
Globulin, Total: 1.7 g/dL (ref 1.5–4.5)
Potassium: 3.5 mmol/L (ref 3.5–5.2)
Sodium: 143 mmol/L (ref 134–144)
Total Protein: 5.7 g/dL — ABNORMAL LOW (ref 6.0–8.5)

## 2018-11-10 LAB — CBC WITH DIFFERENTIAL/PLATELET
BASOS ABS: 0 10*3/uL (ref 0.0–0.2)
Basos: 1 %
EOS (ABSOLUTE): 0 10*3/uL (ref 0.0–0.4)
Eos: 0 %
HEMOGLOBIN: 13.4 g/dL (ref 13.0–17.7)
IMMATURE GRANULOCYTES: 0 %
Immature Grans (Abs): 0 10*3/uL (ref 0.0–0.1)
Lymphocytes Absolute: 1 10*3/uL (ref 0.7–3.1)
Lymphs: 16 %
MCH: 31.2 pg (ref 26.6–33.0)
MCHC: 33.9 g/dL (ref 31.5–35.7)
MCV: 92 fL (ref 79–97)
MONOCYTES: 10 %
MONOS ABS: 0.6 10*3/uL (ref 0.1–0.9)
NEUTROS PCT: 73 %
Neutrophils Absolute: 4.7 10*3/uL (ref 1.4–7.0)
PLATELETS: 213 10*3/uL (ref 150–450)
RBC: 4.29 x10E6/uL (ref 4.14–5.80)
RDW: 12.3 % (ref 11.6–15.4)
WBC: 6.4 10*3/uL (ref 3.4–10.8)

## 2018-11-10 MED ORDER — OMEPRAZOLE 20 MG PO CPDR: 20.0000 mg | DELAYED_RELEASE_CAPSULE | Freq: Every day | ORAL | 3 refills | Status: DC

## 2018-11-10 NOTE — Telephone Encounter (Signed)
Spoke to patient via telephone to review recent lab results.  His H/H although within normal range is slightly lower than previous labs + iron level is low (noting some iron deficiency anemia).  Patient does take a daily multivitamin, but I have recommended taking Ferrous Sulfate 325 MG daily with a glass of OJ.  I am also going to send in a script for Prilosec for GI protection until his GI appointment in the upcoming weeks.  He was able to verbalize back instructions to provider.  CMP overall with normal ALT/AST.

## 2018-11-26 ENCOUNTER — Other Ambulatory Visit: Payer: Self-pay | Admitting: Nurse Practitioner

## 2018-11-26 ENCOUNTER — Other Ambulatory Visit: Payer: 59

## 2018-11-26 DIAGNOSIS — R198 Other specified symptoms and signs involving the digestive system and abdomen: Secondary | ICD-10-CM

## 2018-11-27 ENCOUNTER — Other Ambulatory Visit: Payer: Self-pay | Admitting: Nurse Practitioner

## 2018-11-27 DIAGNOSIS — R198 Other specified symptoms and signs involving the digestive system and abdomen: Secondary | ICD-10-CM

## 2018-11-27 NOTE — Progress Notes (Signed)
IFOBT order placed.

## 2018-12-03 ENCOUNTER — Telehealth: Payer: Self-pay | Admitting: Nurse Practitioner

## 2018-12-03 NOTE — Telephone Encounter (Signed)
He can pick that up Friday.  The lab staff know about his stool sample, as they called him to let him know a different container needs to be used.  Just let them know he is coming on Friday.  I have the order in already.  Thank you.

## 2018-12-03 NOTE — Telephone Encounter (Signed)
Patient would like to pick up the vial here on Friday for his sample.  He states that he dropped off sample before but the sample had to be redone.  I told him I would send the message to [facility] and someone would be checking her messages.    Thank you

## 2018-12-05 ENCOUNTER — Other Ambulatory Visit: Payer: Self-pay

## 2018-12-05 ENCOUNTER — Ambulatory Visit: Payer: 59 | Admitting: Gastroenterology

## 2018-12-05 ENCOUNTER — Ambulatory Visit: Payer: 59 | Admitting: Nurse Practitioner

## 2018-12-05 ENCOUNTER — Encounter: Payer: Self-pay | Admitting: Gastroenterology

## 2018-12-05 VITALS — BP 121/72 | HR 97 | Ht 69.0 in | Wt 199.6 lb

## 2018-12-05 DIAGNOSIS — K625 Hemorrhage of anus and rectum: Secondary | ICD-10-CM | POA: Diagnosis not present

## 2018-12-05 DIAGNOSIS — E611 Iron deficiency: Secondary | ICD-10-CM

## 2018-12-05 DIAGNOSIS — R198 Other specified symptoms and signs involving the digestive system and abdomen: Secondary | ICD-10-CM

## 2018-12-05 MED ORDER — FUSION PLUS PO CAPS: 1.0000 | ORAL_CAPSULE | Freq: Every day | ORAL | 2 refills | Status: DC

## 2018-12-05 NOTE — Progress Notes (Signed)
Eric Darby, MD [address]  [place]  [place], [place] [address]  Main: [phone]  Fax: [phone]    Gastroenterology Consultation  Referring Provider:     Venita Lick, NP Primary Care Physician:  Eric Lick, NP Primary Gastroenterologist:  Dr. Cephas Kline Reason for Consultation:     Rectal bleeding, incomplete emptying, abdominal bloating, iron deficiency        HPI:   Eric Kline is a 38 y.o. male referred by Dr. Venita Lick, NP  for consultation & management of iron deficiency.  He had labs checked as he was complaining of rectal bleeding.  Found to have drop in hemoglobin from 15.7 in 12/2016 to 13.4 in 10/2018, normal ferritin but low iron and iron saturation, B12 and folate normal.  He is also found to have mildly low total protein levels.  He has been having difficulty cleaning after a BM, he says "it takes for ever", BRBPR on wiping, rectal discomfort.  He noticed that his stools were dark for about a week and not sure if this is secondary to oral iron intake.  Currently, his stools are brown although he is taking iron.  He used Preparation H which did not help.  He denies constipation or diarrhea.  He thinks he is gluten sensitive, because Seven years ago, patient went on gluten free diet for 22months as he was experiencing abdominal bloating, lethargy, fogginess and he felt great, did not have any of these symptoms.  He said he has been generously consuming gluten as he found it hard to avoid when he notices that his symptoms do recur but tolerable.  He is a Office manager, his job has been stressful, arranged to work in a different place for 6 months for a change  NSAIDs: None  Antiplts/Anticoagulants/Anti thrombotics: None  GI Procedures: None family history of Hashimoto's thyroiditis  He denies family history of GI malignancy  Past Medical History:  Diagnosis Date  . Allergy   . Concussion    x3    Past Surgical  History:  Procedure Laterality Date  . MENISCUS REPAIR  2002   left     Current Outpatient Medications:  .  imiquimod (ALDARA) 5 % cream, Apply topically 3 (three) times a week. (Patient not taking: Reported on 12/05/2018), Disp: 12 each, Rfl: 1 .  Iron-FA-B Cmp-C-Biot-Probiotic (FUSION PLUS) CAPS, Take 1 capsule by mouth daily., Disp: 30 capsule, Rfl: 2 .  omeprazole (PRILOSEC) 20 MG capsule, Take 1 capsule (20 mg total) by mouth daily. (Patient not taking: Reported on 12/05/2018), Disp: 30 capsule, Rfl: 3    Family History  Problem Relation Age of Onset  . Cancer Mother        skin  . Alzheimer's disease Maternal Grandfather      Social History   Tobacco Use  . Smoking status: Former Research scientist (life sciences)  . Smokeless tobacco: Former Systems developer    Types: Chew  Substance Use Topics  . Alcohol use: No    Comment: pt states he has not drank in the last 5 months   . 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 (No Known Allergies)    Review of Systems:    All systems reviewed and negative except where noted in HPI.   Physical Exam:  BP 121/72   Pulse 97   Ht  5\' 9"  (1.753 m)   Wt 199 lb 9.6 oz (90.5 kg)   BMI 29.48 kg/m  No LMP for male patient.  General:   Alert,  Well-developed, well-nourished, pleasant and cooperative in NAD Head:  Normocephalic and atraumatic. Eyes:  Sclera clear, no icterus.   Conjunctiva pink. Ears:  Normal auditory acuity. Nose:  No deformity, discharge, or lesions. Mouth:  No deformity or lesions,oropharynx pink & moist. Neck:  Supple; no masses or thyromegaly. Lungs:  Respirations even and unlabored.  Clear throughout to auscultation.   No wheezes, crackles, or rhonchi. No acute distress. Heart:  Regular rate and rhythm; no murmurs, clicks, rubs, or gallops. Abdomen:  Normal bowel sounds. Soft, non-tender and non-distended without masses, hepatosplenomegaly or hernias  noted.  No guarding or rebound tenderness.   Rectal: Not performed Msk:  Symmetrical without gross deformities. Good, equal movement & strength bilaterally. Pulses:  Normal pulses noted. Extremities:  No clubbing or edema.  No cyanosis. Neurologic:  Alert and oriented x3;  grossly normal neurologically. Skin:  Intact without significant lesions or rashes. No jaundice. Psych:  Alert and cooperative. Normal mood and affect.  Imaging Studies: None  Assessment and Plan:   Eric Kline is a 38 y.o. pleasant Caucasian male with no significant past medical history, otherwise healthy presents with chronic history of mild abdominal bloating, fatigue responding to gluten-free diet, intermittent rectal bleeding found to have iron deficiency and mild hypoproteinemia.  Recommend EGD with gastric and duodenal biopsies and colonoscopy Check TSH, celiac serologies Recommend fusion plus, 1 pill a day   Follow up in 2 months   Eric Darby, MD

## 2018-12-08 ENCOUNTER — Other Ambulatory Visit: Payer: 59

## 2018-12-08 NOTE — Telephone Encounter (Signed)
I placed a future order in chart about one week ago, with correct sample order per lab.  Thanks.

## 2018-12-08 NOTE — Telephone Encounter (Signed)
Please place order for stool sample pt returned today.

## 2018-12-09 ENCOUNTER — Other Ambulatory Visit: Payer: 59

## 2018-12-09 ENCOUNTER — Other Ambulatory Visit: Payer: Self-pay | Admitting: Nurse Practitioner

## 2018-12-09 DIAGNOSIS — R198 Other specified symptoms and signs involving the digestive system and abdomen: Secondary | ICD-10-CM

## 2018-12-09 LAB — IGA: IgA/Immunoglobulin A, Serum: 195 mg/dL (ref 90–386)

## 2018-12-09 LAB — TSH: TSH: 2.03 u[IU]/mL (ref 0.450–4.500)

## 2018-12-09 LAB — TISSUE TRANSGLUTAMINASE, IGA: Transglutaminase IgA: <2 U/mL (ref 0–3)

## 2018-12-09 NOTE — Telephone Encounter (Signed)
After further review. Lab was released by CMA at [facility]. Called and spoke to her and she stated it was in error, patient did not leave stool sample with them. Please replace order for our lab.

## 2018-12-09 NOTE — Telephone Encounter (Signed)
Done

## 2018-12-09 NOTE — Telephone Encounter (Signed)
Please see if you can see order.

## 2018-12-10 LAB — FECAL OCCULT BLOOD, IMMUNOCHEMICAL: Fecal Occult Bld: NEGATIVE

## 2018-12-24 ENCOUNTER — Other Ambulatory Visit: Payer: Self-pay

## 2018-12-24 ENCOUNTER — Telehealth: Payer: Self-pay

## 2018-12-24 DIAGNOSIS — E611 Iron deficiency: Secondary | ICD-10-CM

## 2018-12-24 DIAGNOSIS — K625 Hemorrhage of anus and rectum: Secondary | ICD-10-CM

## 2018-12-24 NOTE — Telephone Encounter (Signed)
Patient has been notified that [facility] is not in network with [facility] for his colonoscopy currently scheduled for tomorrow.  He has been provided with phone number to contact [facility] regarding Anesthesia cost and will call back to let us know if he wants to reschedule at [facility] for another date or remain as he is.  Thanks [employer]

## 2018-12-25 ENCOUNTER — Encounter: Admission: RE | Payer: Self-pay | Source: Home / Self Care

## 2018-12-25 ENCOUNTER — Ambulatory Visit: Admission: RE | Admit: 2018-12-25 | Payer: 59 | Source: Home / Self Care | Admitting: Gastroenterology

## 2018-12-25 SURGERY — COLONOSCOPY WITH PROPOFOL
Anesthesia: General

## 2018-12-29 ENCOUNTER — Other Ambulatory Visit: Payer: Self-pay

## 2018-12-29 ENCOUNTER — Encounter: Payer: Self-pay | Admitting: *Deleted

## 2019-01-06 NOTE — Discharge Instructions (Signed)
General Anesthesia, Adult, Care After  This sheet gives you information about how to care for yourself after your procedure. Your health care provider may also give you more specific instructions. If you have problems or questions, contact your health care provider.  What can I expect after the procedure?  After the procedure, the following side effects are common:  Pain or discomfort at the IV site.  Nausea.  Vomiting.  Sore throat.  Trouble concentrating.  Feeling cold or chills.  Weak or tired.  Sleepiness and fatigue.  Soreness and body aches. These side effects can affect parts of the body that were not involved in surgery.  Follow these instructions at home:    For at least 24 hours after the procedure:  Have a responsible adult stay with you. It is important to have someone help care for you until you are awake and alert.  Rest as needed.  Do not:  Participate in activities in which you could fall or become injured.  Drive.  Use heavy machinery.  Drink alcohol.  Take sleeping pills or medicines that cause drowsiness.  Make important decisions or sign legal documents.  Take care of children on your own.  Eating and drinking  Follow any instructions from your health care provider about eating or drinking restrictions.  When you feel hungry, start by eating small amounts of foods that are soft and easy to digest (bland), such as toast. Gradually return to your regular diet.  Drink enough fluid to keep your urine pale yellow.  If you vomit, rehydrate by drinking water, juice, or clear broth.  General instructions  If you have sleep apnea, surgery and certain medicines can increase your risk for breathing problems. Follow instructions from your health care provider about wearing your sleep device:  Anytime you are sleeping, including during daytime naps.  While taking prescription pain medicines, sleeping medicines, or medicines that make you drowsy.  Return to your normal activities as told by your health care  provider. Ask your health care provider what activities are safe for you.  Take over-the-counter and prescription medicines only as told by your health care provider.  If you smoke, do not smoke without supervision.  Keep all follow-up visits as told by your health care provider. This is important.  Contact a health care provider if:  You have nausea or vomiting that does not get better with medicine.  You cannot eat or drink without vomiting.  You have pain that does not get better with medicine.  You are unable to pass urine.  You develop a skin rash.  You have a fever.  You have redness around your IV site that gets worse.  Get help right away if:  You have difficulty breathing.  You have chest pain.  You have blood in your urine or stool, or you vomit blood.  Summary  After the procedure, it is common to have a sore throat or nausea. It is also common to feel tired.  Have a responsible adult stay with you for the first 24 hours after general anesthesia. It is important to have someone help care for you until you are awake and alert.  When you feel hungry, start by eating small amounts of foods that are soft and easy to digest (bland), such as toast. Gradually return to your regular diet.  Drink enough fluid to keep your urine pale yellow.  Return to your normal activities as told by your health care provider. Ask your health care   provider what activities are safe for you.  This information is not intended to replace advice given to you by your health care provider. Make sure you discuss any questions you have with your health care provider.  Document Released: 01/14/2001 Document Revised: 05/24/2017 Document Reviewed: 05/24/2017  Elsevier Interactive Patient Education  2019 Elsevier Inc[employer]

## 2019-01-07 ENCOUNTER — Encounter
Admission: RE | Disposition: A | Discharge status: Home / Self Care | Payer: Self-pay | Source: Home / Self Care | Attending: Gastroenterology

## 2019-01-07 ENCOUNTER — Ambulatory Visit
Admission: RE | Admit: 2019-01-07 | Discharge: 2019-01-07 | Disposition: A | Discharge status: Home / Self Care | Payer: 59 | Attending: Gastroenterology | Admitting: Gastroenterology

## 2019-01-07 ENCOUNTER — Ambulatory Visit: Payer: 59 | Admitting: Anesthesiology

## 2019-01-07 ENCOUNTER — Other Ambulatory Visit: Payer: Self-pay

## 2019-01-07 DIAGNOSIS — K625 Hemorrhage of anus and rectum: Secondary | ICD-10-CM | POA: Diagnosis not present

## 2019-01-07 DIAGNOSIS — B9681 Helicobacter pylori [H. pylori] as the cause of diseases classified elsewhere: Secondary | ICD-10-CM | POA: Diagnosis not present

## 2019-01-07 DIAGNOSIS — K2970 Gastritis, unspecified, without bleeding: Secondary | ICD-10-CM | POA: Insufficient documentation

## 2019-01-07 DIAGNOSIS — Z79899 Other long term (current) drug therapy: Secondary | ICD-10-CM | POA: Insufficient documentation

## 2019-01-07 DIAGNOSIS — D509 Iron deficiency anemia, unspecified: Secondary | ICD-10-CM | POA: Diagnosis present

## 2019-01-07 DIAGNOSIS — R140 Abdominal distension (gaseous): Secondary | ICD-10-CM | POA: Diagnosis not present

## 2019-01-07 DIAGNOSIS — Z87891 Personal history of nicotine dependence: Secondary | ICD-10-CM | POA: Diagnosis not present

## 2019-01-07 HISTORY — PX: ESOPHAGOGASTRODUODENOSCOPY (EGD) WITH PROPOFOL: SHX5813

## 2019-01-07 HISTORY — DX: Presence of spectacles and contact lenses: Z97.3

## 2019-01-07 HISTORY — DX: Iron deficiency anemia, unspecified: D50.9

## 2019-01-07 HISTORY — PX: COLONOSCOPY WITH PROPOFOL: SHX5780

## 2019-01-07 SURGERY — COLONOSCOPY WITH PROPOFOL
Anesthesia: General | Site: Throat

## 2019-01-07 MED ORDER — DEXMEDETOMIDINE HCL 200 MCG/2ML IV SOLN
INTRAVENOUS | Status: DC | PRN
  Administered 2019-01-07: 8 ug via INTRAVENOUS

## 2019-01-07 MED ORDER — GLYCOPYRROLATE 0.2 MG/ML IJ SOLN
INTRAMUSCULAR | Status: DC | PRN
  Administered 2019-01-07: 0.2 mg via INTRAVENOUS

## 2019-01-07 MED ORDER — PROPOFOL 10 MG/ML IV BOLUS
INTRAVENOUS | Status: DC | PRN
  Administered 2019-01-07 (×4): 50 mg via INTRAVENOUS
  Administered 2019-01-07: 20 mg via INTRAVENOUS
  Administered 2019-01-07 (×2): 50 mg via INTRAVENOUS
  Administered 2019-01-07: 100 mg via INTRAVENOUS
  Administered 2019-01-07 (×7): 50 mg via INTRAVENOUS
  Administered 2019-01-07: 30 mg via INTRAVENOUS

## 2019-01-07 MED ORDER — LACTATED RINGERS IV SOLN
10.0000 mL/h | INTRAVENOUS | Status: DC
  Administered 2019-01-07: 11:00:00 via INTRAVENOUS

## 2019-01-07 MED ORDER — STERILE WATER FOR IRRIGATION IR SOLN
Status: DC | PRN
  Administered 2019-01-07: 11:00:00

## 2019-01-07 MED ORDER — LIDOCAINE HCL (CARDIAC) PF 100 MG/5ML IV SOSY
PREFILLED_SYRINGE | INTRAVENOUS | Status: DC | PRN
  Administered 2019-01-07: 40 mg via INTRAVENOUS

## 2019-01-07 MED ORDER — ONDANSETRON HCL 4 MG/2ML IJ SOLN: 4.0000 mg | Freq: Once | INTRAMUSCULAR | Status: DC | PRN

## 2019-01-07 SURGICAL SUPPLY — 7 items
BLOCK BITE 60FR ADLT L/F GRN (MISCELLANEOUS) ×3 IMPLANT
CANISTER SUCT 1200ML W/VALVE (MISCELLANEOUS) ×3 IMPLANT
FORCEPS BIOP RAD 4 LRG CAP 4 (CUTTING FORCEPS) ×3 IMPLANT
GOWN CVR UNV OPN BCK APRN NK (MISCELLANEOUS) ×4 IMPLANT
GOWN ISOL THUMB LOOP REG UNIV (MISCELLANEOUS) ×2
KIT ENDO PROCEDURE OLY (KITS) ×3 IMPLANT
WATER STERILE IRR 250ML POUR (IV SOLUTION) ×3 IMPLANT

## 2019-01-07 NOTE — Anesthesia Procedure Notes (Signed)
Procedure Name: MAC Date/Time: 01/07/2019 10:41 AM Performed by: Janna Arch, CRNA Pre-anesthesia Checklist: Patient identified, Emergency Drugs available, Suction available and Patient being monitored Patient Re-evaluated:Patient Re-evaluated prior to induction Oxygen Delivery Method: Simple face mask

## 2019-01-07 NOTE — Transfer of Care (Signed)
Immediate Anesthesia Transfer of Care Note  Patient: Eric Kline  Procedure(s) Performed: COLONOSCOPY WITH PROPOFOL (N/A Rectum) ESOPHAGOGASTRODUODENOSCOPY (EGD) WITH BIOPSIES (N/A Throat)  Patient Location: PACU  Anesthesia Type: General  Level of Consciousness: awake, alert  and patient cooperative  Airway and Oxygen Therapy: Patient Spontanous Breathing and Patient connected to supplemental oxygen  Post-op Assessment: Post-op Vital signs reviewed, Patient's Cardiovascular Status Stable, Respiratory Function Stable, Patent Airway and No signs of Nausea or vomiting  Post-op Vital Signs: Reviewed and stable  Complications: No apparent anesthesia complications

## 2019-01-07 NOTE — Anesthesia Preprocedure Evaluation (Signed)
Anesthesia Evaluation  Patient identified by MRN, date of birth, ID band Patient awake    Reviewed: Allergy & Precautions, H&P , NPO status , Patient's Chart, lab work & pertinent test results  Airway Mallampati: II  TM Distance: >3 FB Neck ROM: full    Dental no notable dental hx.    Pulmonary former smoker,    Pulmonary exam normal breath sounds clear to auscultation       Cardiovascular Normal cardiovascular exam Rhythm:regular Rate:Normal     Neuro/Psych    GI/Hepatic   Endo/Other    Renal/GU      Musculoskeletal   Abdominal   Peds  Hematology   Anesthesia Other Findings   Reproductive/Obstetrics                             Anesthesia Physical Anesthesia Plan  ASA: II  Anesthesia Plan: General   Post-op Pain Management:    Induction: Intravenous  PONV Risk Score and Plan: 2 and Propofol infusion and Treatment may vary due to age or medical condition  Airway Management Planned: Natural Airway  Additional Equipment:   Intra-op Plan:   Post-operative Plan:   Informed Consent: I have reviewed the patients History and Physical, chart, labs and discussed the procedure including the risks, benefits and alternatives for the proposed anesthesia with the patient or authorized representative who has indicated his/her understanding and acceptance.       Plan Discussed with: CRNA  Anesthesia Plan Comments:         Anesthesia Quick Evaluation

## 2019-01-07 NOTE — Anesthesia Postprocedure Evaluation (Signed)
Anesthesia Post Note  Patient: Eric Kline  Procedure(s) Performed: COLONOSCOPY WITH PROPOFOL (N/A Rectum) ESOPHAGOGASTRODUODENOSCOPY (EGD) WITH BIOPSIES (N/A Throat)  Patient location during evaluation: PACU Anesthesia Type: General Level of consciousness: awake and alert and oriented Pain management: satisfactory to patient Vital Signs Assessment: post-procedure vital signs reviewed and stable Respiratory status: spontaneous breathing, nonlabored ventilation and respiratory function stable Cardiovascular status: blood pressure returned to baseline and stable Postop Assessment: Adequate PO intake and No signs of nausea or vomiting Anesthetic complications: no    Raliegh Ip

## 2019-01-07 NOTE — Op Note (Signed)
[facility] Patient Name: Eric Kline Procedure Date: 01/07/2019 10:35 AM MRN: [id number] Account #: [id number] Date of Birth: [birth year] Admit Type: Outpatient Age: 38 Room: [facility] OR ROOM 01 Gender: Male Note Status: Finalized Procedure:            Colonoscopy Indications:          This is the patient's first colonoscopy, Rectal bleeding Providers:            Lin Landsman MD, MD Medicines:            Monitored Anesthesia Care Complications:        No immediate complications. Estimated blood loss: None. Procedure:            Pre-Anesthesia Assessment:                       - Prior to the procedure, a History and Physical was                        performed, and patient medications and allergies were                        reviewed. The patient is competent. The risks and                        benefits of the procedure and the sedation options and                        risks were discussed with the patient. All questions                        were answered and informed consent was obtained.                        Patient identification and proposed procedure were                        verified by the physician, the nurse, the                        anesthesiologist, the anesthetist and the technician in                        the pre-procedure area in the procedure room in the                        endoscopy suite. Mental Status Examination: alert and                        oriented. Airway Examination: normal oropharyngeal                        airway and neck mobility. Respiratory Examination:                        clear to auscultation. CV Examination: normal.                        Prophylactic Antibiotics: The patient does not require  prophylactic antibiotics. Prior Anticoagulants: The                        patient has taken no previous anticoagulant or                        antiplatelet agents. ASA Grade  Assessment: II - A                        patient with mild systemic disease. After reviewing the                        risks and benefits, the patient was deemed in                        satisfactory condition to undergo the procedure. The                        anesthesia plan was to use monitored anesthesia care                        (MAC). Immediately prior to administration of                        medications, the patient was re-assessed for adequacy                        to receive sedatives. The heart rate, respiratory rate,                        oxygen saturations, blood pressure, adequacy of                        pulmonary ventilation, and response to care were                        monitored throughout the procedure. The physical status                        of the patient was re-assessed after the procedure.                       After obtaining informed consent, the colonoscope was                        passed under direct vision. Throughout the procedure,                        the patient's blood pressure, pulse, and oxygen                        saturations were monitored continuously. The was                        introduced through the anus and advanced to the the                        terminal ileum, with identification of the appendiceal  orifice and IC valve. The colonoscopy was performed                        without difficulty. The patient tolerated the procedure                        well. The quality of the bowel preparation was                        evaluated using the BBPS [facility] Bowel Preparation                        Scale) with scores of: Right Colon = 3, Transverse                        Colon = 3 and Left Colon = 3 (entire mucosa seen well                        with no residual staining, small fragments of stool or                        opaque liquid). The total BBPS score equals 9. Findings:      The perianal and  digital rectal examinations were normal. Pertinent       negatives include normal sphincter tone and no palpable rectal lesions.      The terminal ileum appeared normal.      The entire examined colon appeared normal.      The retroflexed view of the distal rectum and anal verge was normal and       showed no anal or rectal abnormalities. Impression:           - The examined portion of the ileum was normal.                       - The entire examined colon is normal.                       - The distal rectum and anal verge are normal on                        retroflexion view.                       - No specimens collected. Recommendation:       - Discharge patient to home (with parent).                       - Resume previous diet today.                       - Continue present medications.                       - Repeat colonoscopy in 10 years for surveillance. Procedure Code(s):    --- Professional ---                       [phone], Colonoscopy, flexible; diagnostic, including                        collection  of specimen(s) by brushing or washing, when                        performed (separate procedure) Diagnosis Code(s):    --- Professional ---                       K62.5, Hemorrhage of anus and rectum CPT copyright 2018 American Medical Association. All rights reserved. The codes documented in this report are preliminary and upon coder review may  be revised to meet current compliance requirements. Dr. Ulyess Mort Lin Landsman MD, MD 01/07/2019 11:10:02 AM This report has been signed electronically. Number of Addenda: 0 Note Initiated On: 01/07/2019 10:35 AM Scope Withdrawal Time: 0 hours 10 minutes 2 seconds  Total Procedure Duration: 0 hours 12 minutes 11 seconds       [facility]

## 2019-01-07 NOTE — Op Note (Signed)
[facility] Gastroenterology Patient Name: Eric Kline Procedure Date: 01/07/2019 10:37 AM MRN: [id number] Account #: [id number] Date of Birth: 09-May-1981 Admit Type: Outpatient Age: 38 Room: [facility] OR ROOM 01 Gender: Male Note Status: Finalized Procedure:            Upper GI endoscopy Indications:          Unexplained iron deficiency anemia Providers:            Lin Landsman MD, MD Referring MD:         Guadalupe Maple, MD (Referring MD) Medicines:            Monitored Anesthesia Care Complications:        No immediate complications. Estimated blood loss: None. Procedure:            Pre-Anesthesia Assessment:                       - Prior to the procedure, a History and Physical was                        performed, and patient medications and allergies were                        reviewed. The patient is competent. The risks and                        benefits of the procedure and the sedation options and                        risks were discussed with the patient. All questions                        were answered and informed consent was obtained.                        Patient identification and proposed procedure were                        verified by the physician, the nurse, the                        anesthesiologist, the anesthetist and the technician in                        the pre-procedure area in the procedure room in the                        endoscopy suite. Mental Status Examination: alert and                        oriented. Airway Examination: normal oropharyngeal                        airway and neck mobility. Respiratory Examination:                        clear to auscultation. CV Examination: normal.                        Prophylactic Antibiotics: The patient does not require  prophylactic antibiotics. Prior Anticoagulants: The                        patient has taken no previous anticoagulant or                antiplatelet agents. ASA Grade Assessment: II - A                        patient with mild systemic disease. After reviewing the                        risks and benefits, the patient was deemed in                        satisfactory condition to undergo the procedure. The                        anesthesia plan was to use monitored anesthesia care                        (MAC). Immediately prior to administration of                        medications, the patient was re-assessed for adequacy                        to receive sedatives. The heart rate, respiratory rate,                        oxygen saturations, blood pressure, adequacy of                        pulmonary ventilation, and response to care were                        monitored throughout the procedure. The physical status                        of the patient was re-assessed after the procedure.                       After obtaining informed consent, the endoscope was                        passed under direct vision. Throughout the procedure,                        the patient's blood pressure, pulse, and oxygen                        saturations were monitored continuously. The was                        introduced through the mouth, and advanced to the                        second part of duodenum. The upper GI endoscopy was                        accomplished without difficulty. The patient  tolerated                        the procedure well. Findings:      The duodenal bulb and second portion of the duodenum were normal.       Biopsies for histology were taken with a cold forceps for evaluation of       celiac disease.      The entire examined stomach was normal. Biopsies were taken with a cold       forceps for Helicobacter pylori testing.      Esophagogastric landmarks were identified: the gastroesophageal junction       was found at 36 cm from the incisors.      The gastroesophageal junction and  examined esophagus were normal. Impression:           - Normal duodenal bulb and second portion of the                        duodenum. Biopsied.                       - Normal stomach. Biopsied.                       - Esophagogastric landmarks identified.                       - Normal gastroesophageal junction and esophagus. Recommendation:       - Await pathology results.                       - Proceed with colonoscopy as scheduled                       See colonoscopy report Procedure Code(s):    --- Professional ---                       [id number], Esophagogastroduodenoscopy, flexible, transoral;                        with biopsy, single or multiple Diagnosis Code(s):    --- Professional ---                       D50.9, Iron deficiency anemia, unspecified CPT copyright 2018 American Medical Association. All rights reserved. The codes documented in this report are preliminary and upon coder review may  be revised to meet current compliance requirements. Dr. Ulyess Mort Lin Landsman MD, MD 01/07/2019 10:54:10 AM This report has been signed electronically. Number of Addenda: 0 Note Initiated On: 01/07/2019 10:37 AM Total Procedure Duration: 0 hours 6 minutes 4 seconds       [facility]

## 2019-01-07 NOTE — H&P (Signed)
Cephas Darby, MD [address]  [place]  [place], [place] [address]  Main: [phone]  Fax: [phone] Pager: [phone]  Primary Care Physician:  Venita Lick, NP Primary Gastroenterologist:  Dr. Cephas Darby  Pre-Procedure History & Physical: HPI:  Eric Kline is a 38 y.o. male is here for an endoscopy and colonoscopy.   Past Medical History:  Diagnosis Date  . Allergy   . Concussion    x3  . Iron deficiency anemia   . Wears contact lenses     Past Surgical History:  Procedure Laterality Date  . ANKLE ARTHROSCOPY Left 04/2017  . MENISCUS REPAIR  2002   left     Prior to Admission medications   Medication Sig Start Date End Date Taking? Authorizing Provider  CANNABIDIOL PO Take by mouth as needed.   Yes [provider]  Iron-FA-B Cmp-C-Biot-Probiotic (FUSION PLUS) CAPS Take 1 capsule by mouth daily. 12/05/18  Yes , Tally Due, MD  Multiple Vitamin (MULTIVITAMIN) tablet Take 1 tablet by mouth daily.   Yes [provider]  imiquimod (ALDARA) 5 % cream Apply topically 3 (three) times a week. Patient not taking: Reported on 12/05/2018 07/25/18   Volney American, PA-C  omeprazole (PRILOSEC) 20 MG capsule Take 1 capsule (20 mg total) by mouth daily. Patient not taking: Reported on 12/05/2018 11/10/18   Marnee Guarneri T, NP    Allergies as of 12/24/2018  . (No Known Allergies)    Family History  Problem Relation Age of Onset  . Cancer Mother        skin  . Alzheimer's disease Maternal Grandfather     Social History   Socioeconomic History  . Marital status: Single    Spouse name: Not on file  . Number of children: Not on file  . Years of education: Not on file  . Highest education level: Not on file  Occupational History  . Not on file  Social Needs  . Financial resource strain: Not on file  . Food insecurity:    Worry: Not on file    Inability: Not on file  . Transportation needs:    Medical: Not on  file    Non-medical: Not on file  Tobacco Use  . Smoking status: Former Research scientist (life sciences)  . Smokeless tobacco: Former Systems developer    Types: Chew    Quit date: 2010  . Tobacco comment: smoked socially in college  Substance and Sexual Activity  . Alcohol use: No    Comment: pt states he has not drank in the last 5 months   . 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 Sexual activity: Not Currently  Lifestyle  . Physical activity:    Days per week: Not on file    Minutes per session: Not on file  . Stress: Not on file  Relationships  . Social connections:    Talks on phone: Not on file    Gets together: Not on file    Attends religious service: Not on file    Active member of club or organization: Not on file    Attends meetings of clubs or organizations: Not on file    Relationship status: Not on file  . Intimate partner violence:    Fear of current or ex partner: Not on file    Emotionally abused: Not on file    Physically abused: Not on file  Forced sexual activity: Not on file  Other Topics Concern  . Not on file  Social History Narrative  . Not on file    Review of Systems: See HPI, otherwise negative ROS  Physical Exam: BP (!) 126/94   Pulse 64   Temp 98.1 F (36.7 C)   Ht 5\' 9"  (1.753 m)   Wt 90.3 kg   SpO2 99%   BMI 29.39 kg/m  General:   Alert,  pleasant and cooperative in NAD Head:  Normocephalic and atraumatic. Neck:  Supple; no masses or thyromegaly. Lungs:  Clear throughout to auscultation.    Heart:  Regular rate and rhythm. Abdomen:  Soft, nontender and nondistended. Normal bowel sounds, without guarding, and without rebound.   Neurologic:  Alert and  oriented x4;  grossly normal neurologically.  Impression/Plan: Eric Kline is here for an endoscopy and colonoscopy to be performed for iron deficiency, abdominal bloating, rectal bleeding  Risks, benefits, limitations, and alternatives regarding   endoscopy and colonoscopy have been reviewed with the patient.  Questions have been answered.  All parties agreeable.   [name], MD  01/07/2019, 10:03 AM

## 2019-01-12 ENCOUNTER — Encounter: Payer: Self-pay | Admitting: Gastroenterology

## 2019-01-13 ENCOUNTER — Other Ambulatory Visit: Payer: Self-pay

## 2019-01-13 DIAGNOSIS — A048 Other specified bacterial intestinal infections: Secondary | ICD-10-CM

## 2019-01-13 MED ORDER — OMEPRAZOLE 40 MG PO CPDR: 40.0000 mg | DELAYED_RELEASE_CAPSULE | Freq: Two times a day (BID) | ORAL | 0 refills | Status: DC

## 2019-01-13 MED ORDER — CLARITHROMYCIN 500 MG PO TABS: 500.0000 mg | ORAL_TABLET | Freq: Two times a day (BID) | ORAL | 0 refills | Status: DC

## 2019-01-13 MED ORDER — AMOXICILLIN 500 MG PO TABS: 1000.0000 mg | ORAL_TABLET | Freq: Two times a day (BID) | ORAL | 0 refills | Status: DC

## 2019-01-13 NOTE — Progress Notes (Signed)
Medications have been sent to pharmacy, pt has been notified and verbalized understanding

## 2019-01-27 ENCOUNTER — Ambulatory Visit (INDEPENDENT_AMBULATORY_CARE_PROVIDER_SITE_OTHER): Payer: 59 | Admitting: Gastroenterology

## 2019-01-27 DIAGNOSIS — K2970 Gastritis, unspecified, without bleeding: Secondary | ICD-10-CM | POA: Diagnosis not present

## 2019-01-27 DIAGNOSIS — R1013 Epigastric pain: Secondary | ICD-10-CM

## 2019-01-27 DIAGNOSIS — B9681 Helicobacter pylori [H. pylori] as the cause of diseases classified elsewhere: Secondary | ICD-10-CM | POA: Diagnosis not present

## 2019-01-27 NOTE — Progress Notes (Signed)
Eric Sear, MD [address]  [place]  Eric [place] [address]  Main: [phone]  Fax: [phone]    Gastroenterology follow up Virtual/Tele Visit  Referring Provider:     Venita Lick, NP Primary Care Physician:  Venita Lick, NP Primary Gastroenterologist:  Dr. Cephas Darby Reason for Consultation:  Dyspepsia, rectal bleeding        HPI:   Eric Kline is a 38 y.o. male referred by Dr. Venita Lick, NP  for consultation & management of dyspepsia and rectal bleeding  Virtual Visit via Telephone Note  I connected with Eric Kline on 01/27/19 at  9:30 AM EDT by telephone and verified that I am speaking with the correct person using two identifiers.   I discussed the limitations, risks, security and privacy concerns of performing an evaluation and management service by telephone and the availability of in person appointments. I also discussed with the patient that there may be a patient responsible charge related to this service. The patient expressed understanding and agreed to proceed.  Location of the Patient: Home  Location of the provider: Home office   History of Present Illness: I initially saw Eric Kline in 11/2018 for dyspepsia and rectal bleeding.  Apparently, performed his EGD and colonoscopy.  EGD was negative for celiac disease, but revealed H. pylori infection.  Colonoscopy was unremarkable.  Started him on triple therapy which he has been taking, will finish the course in 3 days.  He feels about 75% better with regards to his symptoms.  Bowel movements are regular, more consistent and denies any rectal bleeding    NSAIDs: None  Antiplts/Anticoagulants/Anti thrombotics: None  GI Procedures: EGD and colonoscopy 01/07/2019 - Normal duodenal bulb and second portion of the duodenum. Biopsied. - Normal stomach. Biopsied. - Esophagogastric landmarks identified. - Normal gastroesophageal junction and esophagus.  - The examined  portion of the ileum was normal. - The entire examined colon is normal. - The distal rectum and anal verge are normal on retroflexion view. - No specimens collected.   Past Medical History:  Diagnosis Date  . Allergy   . Concussion    x3  . Iron deficiency anemia   . Wears contact lenses     Past Surgical History:  Procedure Laterality Date  . ANKLE ARTHROSCOPY Left 04/2017  . COLONOSCOPY WITH PROPOFOL N/A 01/07/2019   Procedure: COLONOSCOPY WITH PROPOFOL;  Surgeon: Lin Landsman, MD;  Location: [facility];  Service: Endoscopy;  Laterality: N/A;  . ESOPHAGOGASTRODUODENOSCOPY (EGD) WITH PROPOFOL N/A 01/07/2019   Procedure: ESOPHAGOGASTRODUODENOSCOPY (EGD) WITH BIOPSIES;  Surgeon: Lin Landsman, MD;  Location: [facility];  Service: Endoscopy;  Laterality: N/A;  . MENISCUS REPAIR  2002   left     Current Outpatient Medications:  .  amoxicillin (AMOXIL) 500 MG tablet, Take 2 tablets (1,000 mg total) by mouth 2 (two) times daily for 14 days., Disp: 56 tablet, Rfl: 0 .  CANNABIDIOL PO, Take by mouth as needed., Disp: , Rfl:  .  clarithromycin (BIAXIN) 500 MG tablet, Take 1 tablet (500 mg total) by mouth 2 (two) times daily for 14 days., Disp: 28 tablet, Rfl: 0 .  Iron-FA-B Cmp-C-Biot-Probiotic (FUSION PLUS) CAPS, Take 1 capsule by mouth daily., Disp: 30 capsule, Rfl: 2 .  Multiple Vitamin (MULTIVITAMIN) tablet, Take 1 tablet by mouth daily., Disp: , Rfl:  .  omeprazole (PRILOSEC) 40 MG capsule, Take 1 capsule (40 mg total) by mouth 2 (two) times daily  for 14 days., Disp: 28 capsule, Rfl: 0   Family History  Problem Relation Age of Onset  . Cancer Mother        skin  . Alzheimer's disease Maternal Grandfather      Social History   Tobacco Use  . Smoking status: Former Research scientist (life sciences)  . Smokeless tobacco: Former Systems developer    Types: Chew    Quit date: 2010  . Tobacco comment: smoked socially in college  Substance Use Topics  . Alcohol use: No    Comment: pt  states he has not drank in the last 5 months   . 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 (No Known Allergies)     Imaging Studies: No abdominal imaging  Assessment and Plan:   Eric Kline is a 38 y.o. male with chronic dyspepsia.  EGD revealed H. pylori gastritis response triple therapy, symptoms are significantly improved.  Rectal bleeding has resolved, colonoscopy was unremarkable  Recommend H. pylori breath test 4 weeks after completion of triple therapy to confirm eradication   Follow Up Instructions:   I discussed the assessment and treatment plan with the patient. The patient was provided an opportunity to ask questions and all were answered. The patient agreed with the plan and demonstrated an understanding of the instructions.   The patient was advised to call back or seek an in-person evaluation if the symptoms worsen or if the condition fails to improve as anticipated.  I provided 15 minutes of non-face-to-face time during this encounter.   Follow up as needed   Cephas Darby, MD

## 2019-02-01 IMAGING — CR DG CHEST 2V
1 series · 2 of 2 positions shown · non-contrast
Comparison: None.

CLINICAL DATA: 36-year-old male with cough.

EXAM:
CHEST  2 VIEW

[Series 1: dg chest 2 view · 0.14mm/px · 2 of 2 slices shown]
[im 1/2]
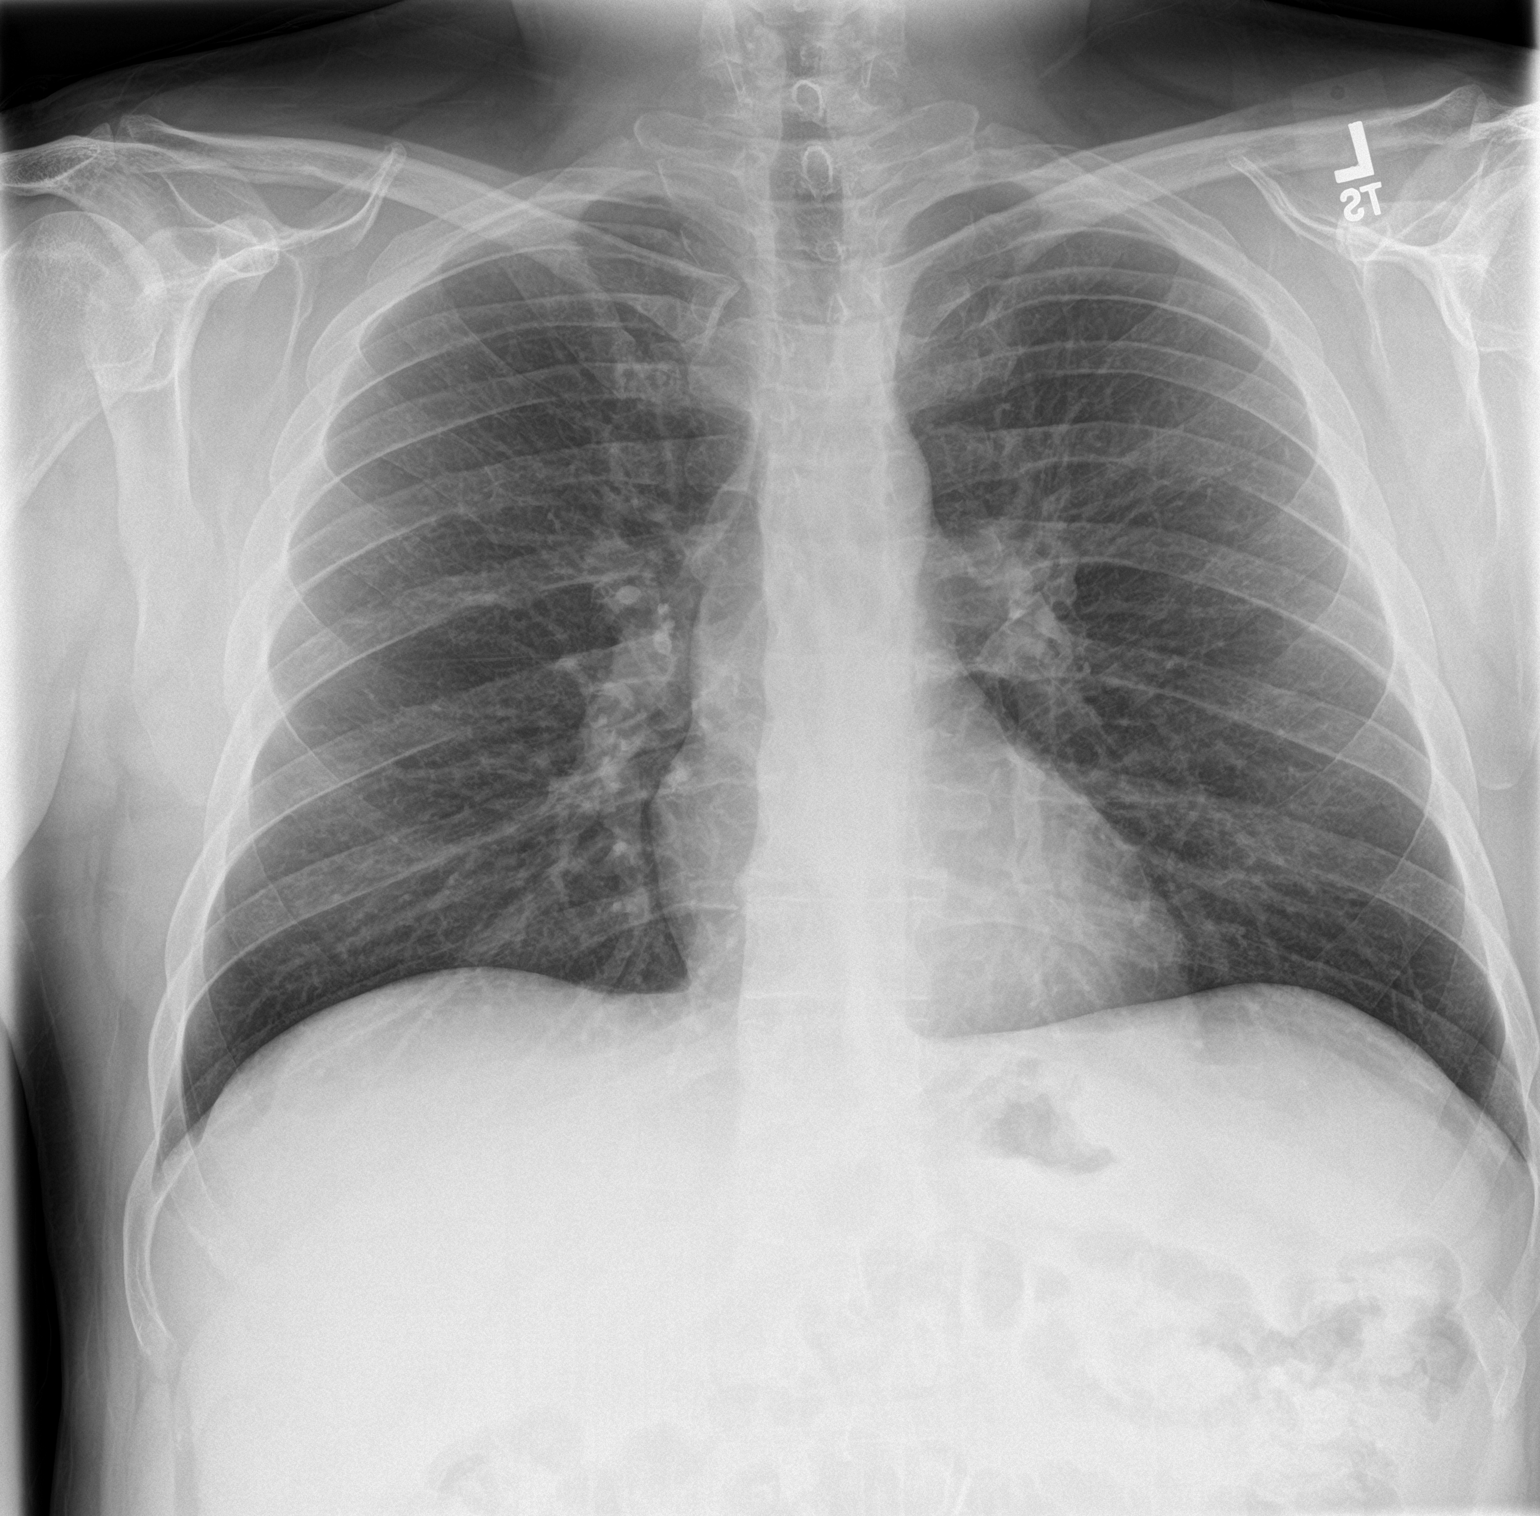
[im 2/2]
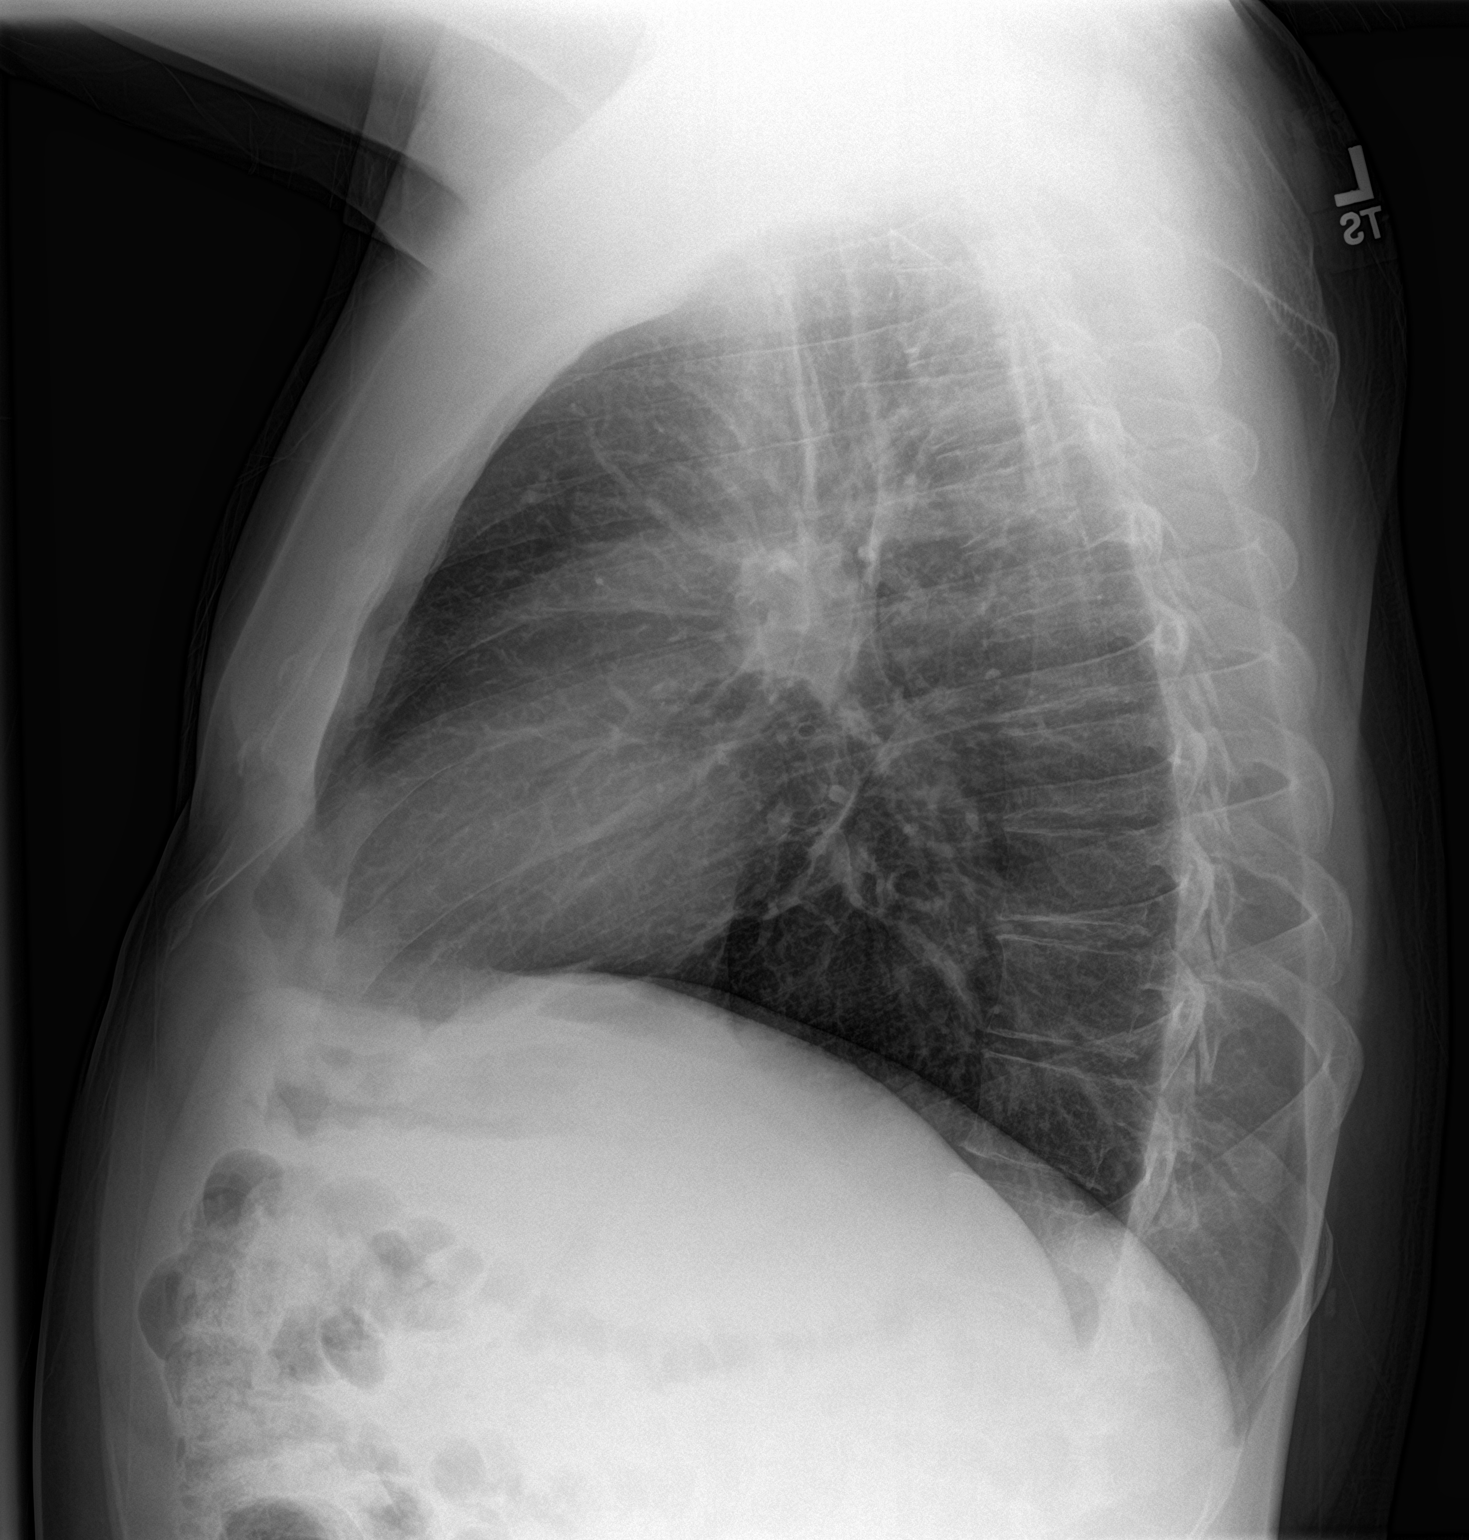

[2 of 2 positions shown; findings below may reference images not displayed]

FINDINGS: The heart size and mediastinal contours are within normal limits.
Both lungs are clear. The visualized skeletal structures are
unremarkable.
IMPRESSION: No active cardiopulmonary disease.

## 2019-02-03 ENCOUNTER — Ambulatory Visit: Payer: 59 | Admitting: Gastroenterology

## 2019-05-14 ENCOUNTER — Encounter: Payer: Self-pay | Admitting: Nurse Practitioner

## 2019-05-14 ENCOUNTER — Ambulatory Visit (INDEPENDENT_AMBULATORY_CARE_PROVIDER_SITE_OTHER): Payer: 59 | Admitting: Nurse Practitioner

## 2019-05-14 ENCOUNTER — Other Ambulatory Visit: Payer: Self-pay

## 2019-05-14 DIAGNOSIS — D509 Iron deficiency anemia, unspecified: Secondary | ICD-10-CM | POA: Diagnosis not present

## 2019-05-14 DIAGNOSIS — R140 Abdominal distension (gaseous): Secondary | ICD-10-CM | POA: Diagnosis not present

## 2019-05-14 DIAGNOSIS — B977 Papillomavirus as the cause of diseases classified elsewhere: Secondary | ICD-10-CM

## 2019-05-14 DIAGNOSIS — A048 Other specified bacterial intestinal infections: Secondary | ICD-10-CM | POA: Insufficient documentation

## 2019-05-14 DIAGNOSIS — Z8619 Personal history of other infectious and parasitic diseases: Secondary | ICD-10-CM | POA: Insufficient documentation

## 2019-05-14 MED ORDER — IMIQUIMOD 5 % EX CREA: TOPICAL_CREAM | CUTANEOUS | 2 refills | Status: DC

## 2019-05-14 MED ORDER — FUSION PLUS PO CAPS: 1.0000 | ORAL_CAPSULE | Freq: Every day | ORAL | 2 refills | Status: DC

## 2019-05-14 NOTE — Progress Notes (Signed)
There were no vitals taken for this visit.   Subjective:    Patient ID: Eric Kline, male    DOB: September 02, 1981, 38 y.o.   MRN: [id number]  HPI: Eric Kline is a 38 y.o. male  Chief Complaint  Patient presents with  . Follow-up    pt states he thinks he has another H pylori infection    . This visit was completed via telephone due to the restrictions of the COVID-19 pandemic. All issues as above were discussed and addressed but no physical exam was performed. If it was felt that the patient should be evaluated in the office, they were directed there. The patient verbally consented to this visit. Patient was unable to complete an audio/visual visit due to Technical difficulties,Lack of internet. Due to the catastrophic nature of the COVID-19 pandemic, this visit was done through audio contact only. . Location of the patient: home . Location of the provider: home . Those involved with this call:  . Provider: Marnee Guarneri, DNP . CMA: Gerda Diss, CMA . Front Desk/Registration: Jill Side  . Time spent on call: 15 minutes with patient face to face via video conference. More than 50% of this time was spent in counseling and coordination of care. 10 minutes total spent in review of patient's record and preparation of their chart.  . I verified patient identity using two factors (patient name and date of birth). Patient consents verbally to being seen via telemedicine visit today.    ABDOMINAL BLOATING Treated for H. Pylori on 01/27/2019 after testing returned positive at GI, treated with Clarithromycin/Amoxicillin/Prilosec.  After this treatment symptoms improved.  Then one month ago symptoms began returning with bloating and lethargy, similar to previous symptoms.  He is also requesting refills on his iron and his Imiquimod.  Wishes to have labs redrawn for iron levels and thyroid. Duration:months Onset: gradual Quality: bloating with no pain Location: generalized Frequency:  intermittent Alleviating factors: nothing he can think of, has tried decreasing gluten Aggravating factors: eating meals Status: fluctuating Treatments attempted: PPI Fever: no Nausea: no Vomiting: no Weight loss: no Decreased appetite: no Diarrhea: no Constipation: no Blood in stool: no Heartburn: no Jaundice: no Rash: no Dysuria/urinary frequency: no Hematuria: no History of sexually transmitted disease: no Recurrent NSAID use: no  Relevant past medical, surgical, family and social history reviewed and updated as indicated. Interim medical history since our last visit reviewed. Allergies and medications reviewed and updated.  Review of Systems  Constitutional: Negative for activity change, diaphoresis, fatigue and fever.  Respiratory: Negative for cough, chest tightness, shortness of breath and wheezing.   Cardiovascular: Negative for chest pain, palpitations and leg swelling.  Gastrointestinal: Positive for abdominal distention. Negative for abdominal pain, constipation, diarrhea, nausea and vomiting.  Musculoskeletal: Negative.   Skin: Negative.   Neurological: Negative for dizziness, syncope, weakness, light-headedness, numbness and headaches.  Psychiatric/Behavioral: Negative.     Per HPI unless specifically indicated above     Objective:    There were no vitals taken for this visit.  Wt Readings from Last 3 Encounters:  01/07/19 199 lb (90.3 kg)  12/05/18 199 lb 9.6 oz (90.5 kg)  11/07/18 201 lb 2 oz (91.2 kg)    Physical Exam   Telephone visit only due to patient having no connection for virtual.  He denies pain on self palpation of abdomen.  Results for orders placed or performed in visit on 12/05/18  Fecal occult blood, imunochemical   ST  Result Value Ref Range  Fecal Occult Bld Negative Negative  IgA  Result Value Ref Range   IgA/Immunoglobulin A, Serum 195 90 - 386 mg/dL  Tissue transglutaminase, IgA  Result Value Ref Range   Transglutaminase  IgA <2 0 - 3 U/mL  TSH  Result Value Ref Range   TSH 2.030 0.450 - 4.500 uIU/mL      Assessment & Plan:   Problem List Items Addressed This Visit      Other   HPV (human papilloma virus) infection    Aldara refilled      Relevant Medications   imiquimod (ALDARA) 5 % cream (Start on 05/15/2019)   Iron deficiency anemia    Recheck CBC and anemia panel.  Continue supplement, refills sent.      Relevant Medications   Iron-FA-B Cmp-C-Biot-Probiotic (FUSION PLUS) CAPS   Other Relevant Orders   Anemia panel   CBC with Differential/Platelet   Abdominal bloating - Primary    Will obtain outpatient H. Pylori labs and thyroid recheck + CMP.  If presence of H. Pylori will treat accordingly.  Follow-up in 2 weeks.      Relevant Orders   Thyroid Panel With TSH   Helicobacter pylori abs-IgG+IgA, bld   Comprehensive metabolic panel      I discussed the assessment and treatment plan with the patient. The patient was provided an opportunity to ask questions and all were answered. The patient agreed with the plan and demonstrated an understanding of the instructions.   The patient was advised to call back or seek an in-person evaluation if the symptoms worsen or if the condition fails to improve as anticipated.   I provided 15 minutes of time during this encounter.  Follow up plan: Return in about 2 weeks (around 05/28/2019) for Abdominal bloating.

## 2019-05-14 NOTE — Assessment & Plan Note (Signed)
Will obtain outpatient H. Pylori labs and thyroid recheck + CMP.  If presence of H. Pylori will treat accordingly.  Follow-up in 2 weeks.

## 2019-05-14 NOTE — Assessment & Plan Note (Signed)
Recheck CBC and anemia panel.  Continue supplement, refills sent.

## 2019-05-14 NOTE — Assessment & Plan Note (Signed)
Aldara refilled

## 2019-05-14 NOTE — Patient Instructions (Signed)
Helicobacter Pylori Antibodies Test Why am I having this test? This test is used to check for a type of bacteria called Helicobacter pylori (H. pylori). H. pylori can be found in the cells that line the stomach. Having high levels of H. pylori in your stomach puts you at risk for:  Stomach ulcers and small bowel ulcers.  Long-term (chronic) inflammation of the lining of the stomach.  Ulcers in the part of the body that moves food from the mouth to the stomach (esophagus).  Stomach cancer if the infection is not treated. Most people with H. pylori in their stomach have no symptoms. Your health care provider may ask you to have this test if you have symptoms of a stomach ulcer or small bowel ulcer, such as stomach pain before or after eating, heartburn, or nausea after eating. What is being tested? This test checks your blood for antibodies to the H. pylori bacteria. Antibodies are proteins made by your immune system to fight germs and infection. The test checks for antibodies that the immune system produces in response to infection with H. pylori. What kind of sample is taken?  A blood sample is required for this test. It is usually collected by inserting a needle into a blood vessel or by sticking a finger with a small needle. Tell a health care provider about:  All medicines you are taking, including vitamins, herbs, eye drops, creams, and over-the-counter medicines. How are the results reported? Your test results will be reported as values that are categorized as positive, negative, or equivocal. Equivocal means that your results are neither positive nor negative. Your health care provider will compare your results to normal ranges that were established after testing a large group of people (reference ranges). Reference ranges may vary among labs and hospitals. For this test, common reference ranges for the two types of antibodies that may be tested are:  IgG antibodies: ? Less than 0.75  units/mL. This is negative. ? Greater than or equal to 1 unit/mL. This is positive. ? 0.75-0.99 units/mL. This is equivocal.  IgM antibodies: ? Less than or equal to 30 units/mL. This is negative. ? Greater than or equal to 40 units/mL. This is positive. ? 30.01-39.99 units/mL. This is equivocal. What do the results mean? Test results that are higher than normal, or positive, may indicate various health conditions, such as:  Short-term or long-term irritation of the stomach lining (gastritis).  Small bowel ulcer.  Stomach ulcer.  Stomach cancer. Talk with your health care provider about what your results mean. Questions to ask your health care provider Ask your health care provider, or the department that is doing the test:  When will my results be ready?  How will I get my results?  What are my treatment options?  What other tests do I need?  What are my next steps? Summary  This test is used to check for a type of bacteria called Helicobacter pylori (H. pylori). Having high levels of H. pylori in your stomach puts you at risk for ulcers in the gastrointestinal tract or stomach cancer.  This test checks your blood for antibodies to the H. pylori bacteria.  Most people with H. pylori in their stomach have no symptoms. Your health care provider may ask you to have this test if you have symptoms of a stomach ulcer or small bowel ulcer, such as stomach pain before or after eating, heartburn, or nausea after eating.  Talk with your health care provider about what  your results mean. This information is not intended to replace advice given to you by your health care provider. Make sure you discuss any questions you have with your health care provider. Document Released: 11/01/2004 Document Revised: 09/20/2017 Document Reviewed: 05/21/2017 Elsevier Patient Education  2020 [employer].

## 2019-05-20 ENCOUNTER — Other Ambulatory Visit: Payer: Self-pay

## 2019-05-20 ENCOUNTER — Other Ambulatory Visit: Payer: 59

## 2019-05-20 DIAGNOSIS — A048 Other specified bacterial intestinal infections: Secondary | ICD-10-CM

## 2019-05-20 DIAGNOSIS — R140 Abdominal distension (gaseous): Secondary | ICD-10-CM

## 2019-05-20 DIAGNOSIS — D509 Iron deficiency anemia, unspecified: Secondary | ICD-10-CM

## 2019-05-22 ENCOUNTER — Other Ambulatory Visit: Payer: Self-pay | Admitting: Nurse Practitioner

## 2019-05-22 LAB — THYROID PANEL WITH TSH
Free Thyroxine Index: 2.1 (ref 1.2–4.9)
T3 Uptake Ratio: 26 % (ref 24–39)
T4, Total: 8 ug/dL (ref 4.5–12.0)
TSH: 2.46 u[IU]/mL (ref 0.450–4.500)

## 2019-05-22 LAB — COMPREHENSIVE METABOLIC PANEL
ALT: 21 IU/L (ref 0–44)
AST: 17 IU/L (ref 0–40)
Albumin/Globulin Ratio: 2 (ref 1.2–2.2)
Albumin: 4.7 g/dL (ref 4.0–5.0)
Alkaline Phosphatase: 63 IU/L (ref 39–117)
BUN/Creatinine Ratio: 10 (ref 9–20)
BUN: 11 mg/dL (ref 6–24)
Bilirubin Total: 0.8 mg/dL (ref 0.0–1.2)
CO2: 23 mmol/L (ref 20–29)
Calcium: 9.6 mg/dL (ref 8.7–10.2)
Chloride: 103 mmol/L (ref 96–106)
Creatinine, Ser: 1.07 mg/dL (ref 0.76–1.27)
GFR calc Af Amer: 100 mL/min/{1.73_m2} (ref >59)
GFR calc non Af Amer: 86 mL/min/{1.73_m2} (ref >59)
Globulin, Total: 2.3 g/dL (ref 1.5–4.5)
Glucose: 94 mg/dL (ref 65–99)
Potassium: 4.6 mmol/L (ref 3.5–5.2)
Sodium: 141 mmol/L (ref 134–144)
Total Protein: 7 g/dL (ref 6.0–8.5)

## 2019-05-22 LAB — ANEMIA PANEL
Ferritin: 112 ng/mL (ref 30–400)
Folate, Hemolysate: 451 ng/mL
Folate, RBC: 1020 ng/mL (ref >498)
Hematocrit: 44.2 % (ref 37.5–51.0)
Iron Saturation: 42 % (ref 15–55)
Iron: 115 ug/dL (ref 38–169)
Retic Ct Pct: 1.4 % (ref 0.6–2.6)
Total Iron Binding Capacity: 272 ug/dL (ref 250–450)
UIBC: 157 ug/dL (ref 111–343)
Vitamin B-12: 423 pg/mL (ref 232–1245)

## 2019-05-22 LAB — CBC WITH DIFFERENTIAL/PLATELET
Basophils Absolute: 0 10*3/uL (ref 0.0–0.2)
Basos: 0 %
EOS (ABSOLUTE): 0.2 10*3/uL (ref 0.0–0.4)
Eos: 3 %
Hemoglobin: 15.5 g/dL (ref 13.0–17.7)
Immature Grans (Abs): 0 10*3/uL (ref 0.0–0.1)
Immature Granulocytes: 0 %
Lymphocytes Absolute: 1.6 10*3/uL (ref 0.7–3.1)
Lymphs: 27 %
MCH: 31.2 pg (ref 26.6–33.0)
MCHC: 35.1 g/dL (ref 31.5–35.7)
MCV: 89 fL (ref 79–97)
Monocytes Absolute: 0.4 10*3/uL (ref 0.1–0.9)
Monocytes: 7 %
Neutrophils Absolute: 3.8 10*3/uL (ref 1.4–7.0)
Neutrophils: 63 %
Platelets: 247 10*3/uL (ref 150–450)
RBC: 4.97 x10E6/uL (ref 4.14–5.80)
RDW: 12.7 % (ref 11.6–15.4)
WBC: 6 10*3/uL (ref 3.4–10.8)

## 2019-05-22 LAB — HELICOBACTER PYLORI ABS-IGG+IGA, BLD
H. pylori, IgA Abs: 19 units — ABNORMAL HIGH (ref 0.0–8.9)
H. pylori, IgG AbS: 7.7 Index Value — ABNORMAL HIGH (ref 0.00–0.79)

## 2019-05-22 MED ORDER — AMOXICILLIN 500 MG PO TABS: 1000.0000 mg | ORAL_TABLET | Freq: Two times a day (BID) | ORAL | 0 refills | Status: AC

## 2019-05-22 MED ORDER — CLARITHROMYCIN 500 MG PO TABS: 500.0000 mg | ORAL_TABLET | Freq: Two times a day (BID) | ORAL | 0 refills | Status: AC

## 2019-05-22 MED ORDER — OMEPRAZOLE 40 MG PO CPDR: 40.0000 mg | DELAYED_RELEASE_CAPSULE | Freq: Two times a day (BID) | ORAL | 0 refills | Status: DC

## 2019-05-22 NOTE — Progress Notes (Signed)
Patient Eric Kline testing returned positive.  Was previously treated successfully with Clarithromycin, Amoxicillin, and Omeprazole for 14 days.  Will send script for these.  Have alerted patient.

## 2019-06-04 ENCOUNTER — Ambulatory Visit (INDEPENDENT_AMBULATORY_CARE_PROVIDER_SITE_OTHER): Payer: 59 | Admitting: Nurse Practitioner

## 2019-06-04 ENCOUNTER — Other Ambulatory Visit: Payer: Self-pay

## 2019-06-04 ENCOUNTER — Encounter: Payer: Self-pay | Admitting: Nurse Practitioner

## 2019-06-04 DIAGNOSIS — A048 Other specified bacterial intestinal infections: Secondary | ICD-10-CM

## 2019-06-04 NOTE — Progress Notes (Signed)
Pulse 68 Comment: pt reported   Subjective:    Patient ID: Eric Kline, male    DOB: 12/02/1980, 38 y.o.   MRN: [id number]  HPI: Eric Kline is a 38 y.o. male  Chief Complaint  Patient presents with  . Abdominal Pain    2 week f/up     . This visit was completed via WebEx due to the restrictions of the COVID-19 pandemic. All issues as above were discussed and addressed. Physical exam was done as above through visual confirmation on WebEx. If it was felt that the patient should be evaluated in the office, they were directed there. The patient verbally consented to this visit. . Location of the patient: home . Location of the provider: home . Those involved with this call:  . Provider: Marnee Guarneri, DNP . CMA: Yvonna Alanis, CMA . Front Desk/Registration: Jill Side  . Time spent on call: 15 minutes with patient face to face via video conference. More than 50% of this time was spent in counseling and coordination of care. 10 minutes total spent in review of patient's record and preparation of their chart.  . I verified patient identity using two factors (patient name and date of birth). Patient consents verbally to being seen via telemedicine visit today.    H. PYLORI INFECTION: Treated 05/22/2019 for H. Pylori infection, blood work returned noting positive infection results.  This is second treatment round in past couple months (treated with Clindamycin, Amoxicillin, and Prilosec).  He reports improvement in symptoms, including bloating, abdominal pain, N&V, heart burn, or fever.  States no further concerns or issues.  Currently he is single, lives on his own.  Has no other people in household. Lives in city, filtered water, no well water.  No recent travel overseas.  At this time he reports feeling 100% better.    Relevant past medical, surgical, family and social history reviewed and updated as indicated. Interim medical history since our last visit reviewed. Allergies  and medications reviewed and updated.  Review of Systems  Constitutional: Negative for activity change, diaphoresis, fatigue and fever.  Respiratory: Negative for cough, chest tightness, shortness of breath and wheezing.   Cardiovascular: Negative for chest pain, palpitations and leg swelling.  Gastrointestinal: Negative for abdominal distention, abdominal pain, constipation, diarrhea, nausea and vomiting.  Neurological: Negative for dizziness, syncope, weakness, light-headedness, numbness and headaches.    Per HPI unless specifically indicated above     Objective:    Pulse 68 Comment: pt reported  Wt Readings from Last 3 Encounters:  01/07/19 199 lb (90.3 kg)  12/05/18 199 lb 9.6 oz (90.5 kg)  11/07/18 201 lb 2 oz (91.2 kg)    Physical Exam Vitals signs and nursing note reviewed.  Constitutional:      General: He is awake. He is not in acute distress.    Appearance: He is well-developed. He is not ill-appearing.  HENT:     Head: Normocephalic.     Right Ear: Hearing normal. No drainage.     Left Ear: Hearing normal. No drainage.  Eyes:     General: Lids are normal.        Right eye: No discharge.        Left eye: No discharge.     Conjunctiva/sclera: Conjunctivae normal.  Neck:     Musculoskeletal: Normal range of motion.  Cardiovascular:     Comments: Unable to auscultate due to virtual exam only  Pulmonary:     Effort: Pulmonary effort is normal.  No accessory muscle usage or respiratory distress.     Comments: Unable to auscultate due to virtual exam only  Abdominal:     Tenderness: There is no abdominal tenderness.     Comments: Unable to auscultate due to virtual exam only.  Patient performed self palpation and denies pain or tenderness.  Reports abdomen as soft and non-distended.  Neurological:     Mental Status: He is alert and oriented to person, place, and time.  Psychiatric:        Mood and Affect: Mood normal.        Behavior: Behavior normal. Behavior is  cooperative.        Thought Content: Thought content normal.        Judgment: Judgment normal.     Results for orders placed or performed in visit on 05/20/19  Anemia panel  Result Value Ref Range   Total Iron Binding Capacity 272 250 - 450 ug/dL   UIBC 157 111 - 343 ug/dL   Iron 115 38 - 169 ug/dL   Iron Saturation 42 15 - 55 %   Vitamin B-12 423 232 - 1,245 pg/mL   Folate, Hemolysate 451.0 Not Estab. ng/mL   Hematocrit 44.2 37.5 - 51.0 %   Folate, RBC 1,020 >498 ng/mL   Ferritin 112 30 - 400 ng/mL   Retic Ct Pct 1.4 0.6 - 2.6 %  Comprehensive metabolic panel  Result Value Ref Range   Glucose 94 65 - 99 mg/dL   BUN 11 6 - 24 mg/dL   Creatinine, Ser 1.07 0.76 - 1.27 mg/dL   GFR calc non Af Amer 86 >59 mL/min/1.73   GFR calc Af Amer 100 >59 mL/min/1.73   BUN/Creatinine Ratio 10 9 - 20   Sodium 141 134 - 144 mmol/L   Potassium 4.6 3.5 - 5.2 mmol/L   Chloride 103 96 - 106 mmol/L   CO2 23 20 - 29 mmol/L   Calcium 9.6 8.7 - 10.2 mg/dL   Total Protein 7.0 6.0 - 8.5 g/dL   Albumin 4.7 4.0 - 5.0 g/dL   Globulin, Total 2.3 1.5 - 4.5 g/dL   Albumin/Globulin Ratio 2.0 1.2 - 2.2   Bilirubin Total 0.8 0.0 - 1.2 mg/dL   Alkaline Phosphatase 63 39 - 117 IU/L   AST 17 0 - 40 IU/L   ALT 21 0 - 44 IU/L  Helicobacter pylori abs-IgG+IgA, bld  Result Value Ref Range   H. pylori, IgA Abs 19.0 (H) 0.0 - 8.9 units   H. pylori, IgG AbS 7.70 (H) 0.00 - 0.79 Index Value  Thyroid Panel With TSH  Result Value Ref Range   TSH 2.460 0.450 - 4.500 uIU/mL   T4, Total 8.0 4.5 - 12.0 ug/dL   T3 Uptake Ratio 26 24 - 39 %   Free Thyroxine Index 2.1 1.2 - 4.9  CBC with Differential/Platelet  Result Value Ref Range   WBC 6.0 3.4 - 10.8 x10E3/uL   RBC 4.97 4.14 - 5.80 x10E6/uL   Hemoglobin 15.5 13.0 - 17.7 g/dL   MCV 89 79 - 97 fL   MCH 31.2 26.6 - 33.0 pg   MCHC 35.1 31.5 - 35.7 g/dL   RDW 12.7 11.6 - 15.4 %   Platelets 247 150 - 450 x10E3/uL   Neutrophils 63 Not Estab. %   Lymphs 27 Not Estab.  %   Monocytes 7 Not Estab. %   Eos 3 Not Estab. %   Basos 0 Not Estab. %   Neutrophils  Absolute 3.8 1.4 - 7.0 x10E3/uL   Lymphocytes Absolute 1.6 0.7 - 3.1 x10E3/uL   Monocytes Absolute 0.4 0.1 - 0.9 x10E3/uL   EOS (ABSOLUTE) 0.2 0.0 - 0.4 x10E3/uL   Basophils Absolute 0.0 0.0 - 0.2 x10E3/uL   Immature Granulocytes 0 Not Estab. %   Immature Grans (Abs) 0.0 0.0 - 0.1 x10E3/uL      Assessment & Plan:   Problem List Items Addressed This Visit      Other   H. pylori infection    Acute and improved.  Treated with Clindamycin, Amoxicillin, and Prilosec (this is second round in past two months).  Will continue to monitor, if recurrent infection will change abx regimen.         I discussed the assessment and treatment plan with the patient. The patient was provided an opportunity to ask questions and all were answered. The patient agreed with the plan and demonstrated an understanding of the instructions.   The patient was advised to call back or seek an in-person evaluation if the symptoms worsen or if the condition fails to improve as anticipated.   I provided 15 minutes of time during this encounter.  Follow up plan: Return if symptoms worsen or fail to improve.

## 2019-06-04 NOTE — Patient Instructions (Signed)
Helicobacter Pylori Infection  Helicobacter pylori infection is a bacterial infection in the stomach. Long-term (chronic) infection can cause stomach irritation (gastritis), ulcers in the stomach (gastric ulcers), and ulcers in the upper part of the intestine (duodenal ulcers). Having this infection may also increase your risk of stomach cancer and a type of white blood cell cancer (lymphoma) that affects the stomach.  What are the causes?  This infection is caused by the Helicobacter pylori (H. pylori) bacteria. Many healthy people have this bacteria in their stomach lining. The bacteria may also spread from person to person through contact with stool (feces) or saliva. It is not known why some people develop ulcers, gastritis, or cancer from the bacteria.  What increases the risk?  You are more likely to develop this condition if you:   Have family members with the infection.   Live with many other people, such as in a dormitory.   Are of African, Hispanic, or Asian descent.  What are the signs or symptoms?  Most people with this infection do not have any symptoms. If you do have symptoms, they may include:   Heartburn.   Stomach pain.   Nausea.   Vomiting. The vomit may be bloody because of ulcers.   Loss of appetite.   Bad breath.  How is this diagnosed?  This condition may be diagnosed based on:   Your symptoms and medical history.   A physical exam.   Blood tests.   Stool tests.   A breath test.   A procedure that involves placing a tube with a camera on the end of it down your throat to examine your stomach and upper intestine (upper endoscopy).   Removing and testing a tissue sample from the stomach lining (biopsy). A biopsy may be taken during an upper endoscopy.  How is this treated?    This condition is treated by taking a combination of medicines (triple therapy) for several weeks. Triple therapy includes one medicine to reduce the amount of acid in your stomach and two types of  antibiotic medicines. This treatment may reduce your risk of cancer.  You may need to be tested for H. pylori again after treatment. In some cases, the treatment may need to be repeated if your treatment did not get rid of all the bacteria.  Follow these instructions at home:     Take over-the-counter and prescription medicines only as told by your health care provider.   Take your antibiotics as told by your health care provider. Do not stop taking the antibiotics even if you start to feel better.   Return to your normal activities as told by your health care provider. Ask your health care provider what activities are safe for you.   Take steps to prevent future infections:  ? Wash your hands often with soap and water. If soap and water are not available, use hand sanitizer.  ? Do not eat food or drink water that may have had contact with stool or saliva.   Keep all follow-up visits as told by your health care provider. This is important. You may need tests to make sure your treatment worked.  Contact a health care provider if your symptoms:   Do not get better with treatment.   Return after treatment.  Summary   Helicobacter pylori infection is a stomach infection caused by the Helicobacter pylori (H. pylori) bacteria.   This infection can cause stomach irritation (gastritis), ulcers in the stomach (gastric ulcers), and ulcers   you start to feel better. This information is not intended to replace advice given to you by your health care provider. Make sure you discuss any questions you have with your health care provider. Document Released: 01/30/2016 Document Revised: 01/29/2019 Document Reviewed: 10/01/2017 Elsevier Patient Education  2020  [employer].

## 2019-06-04 NOTE — Assessment & Plan Note (Signed)
Acute and improved.  Treated with Clindamycin, Amoxicillin, and Prilosec (this is second round in past two months).  Will continue to monitor, if recurrent infection will change abx regimen.

## 2019-07-28 ENCOUNTER — Encounter: Payer: Self-pay | Admitting: Nurse Practitioner

## 2019-07-28 ENCOUNTER — Ambulatory Visit (INDEPENDENT_AMBULATORY_CARE_PROVIDER_SITE_OTHER): Payer: 59 | Admitting: Nurse Practitioner

## 2019-07-28 ENCOUNTER — Other Ambulatory Visit: Payer: Self-pay

## 2019-07-28 VITALS — BP 113/73 | HR 73 | Temp 98.5°F | Ht 69.0 in | Wt 206.0 lb

## 2019-07-28 DIAGNOSIS — Z0000 Encounter for general adult medical examination without abnormal findings: Secondary | ICD-10-CM

## 2019-07-28 DIAGNOSIS — D509 Iron deficiency anemia, unspecified: Secondary | ICD-10-CM | POA: Diagnosis not present

## 2019-07-28 DIAGNOSIS — B977 Papillomavirus as the cause of diseases classified elsewhere: Secondary | ICD-10-CM

## 2019-07-28 NOTE — Progress Notes (Signed)
BP 113/73   Pulse 73   Temp 98.5 F (36.9 C) (Oral)   Ht 5\' 9"  (1.753 m)   Wt 206 lb (93.4 kg)   SpO2 98%   BMI 30.42 kg/m    Subjective:    Patient ID: Eric Kline, male    DOB: [birth year], 38 y.o.   MRN: [id number]  HPI: Eric Kline is a 38 y.o. male presenting on 07/28/2019 for comprehensive medical examination. Current medical complaints include:none  He currently lives with: self Interim Problems from his last visit: no   HPV: Reports Aldara is not working as well now, last refilled in July and reports it will work for short period, but then he gets blisters and no improvement.  Would like referral back to dermatology, has seen them in the past for similar and would like head to toe skin exam.  Functional Status Survey: Is the patient deaf or have difficulty hearing?: No Does the patient have difficulty seeing, even when wearing glasses/contacts?: No Does the patient have difficulty concentrating, remembering, or making decisions?: No Does the patient have difficulty walking or climbing stairs?: No Does the patient have difficulty dressing or bathing?: No Does the patient have difficulty doing errands alone such as visiting a doctor's office or shopping?: No  FALL RISK: Fall Risk  07/28/2019 07/25/2018 01/15/2017  Falls in the past year? 0 No Yes  Number falls in past yr: 0 - 1  Injury with Fall? 0 - Yes  Follow up Falls evaluation completed - -    Depression Screen Depression screen [facility] 2/9 07/28/2019 07/25/2018 07/25/2018 01/15/2017  Decreased Interest 0 0 0 1  Down, Depressed, Hopeless 0 0 0 1  PHQ - 2 Score 0 0 0 2  Altered sleeping - 1 0 -  Tired, decreased energy - 0 0 -  Change in appetite - 0 0 -  Feeling bad or failure about yourself  - 0 0 -  Trouble concentrating - 0 0 -  Moving slowly or fidgety/restless - 0 0 -  Suicidal thoughts - 0 0 -  PHQ-9 Score - 1 0 -    Advanced Directives <no information>  Past Medical History:  Past Medical History:   Diagnosis Date  . Allergy   . Concussion    x3  . Iron deficiency anemia   . Wears contact lenses     Surgical History:  Past Surgical History:  Procedure Laterality Date  . ANKLE ARTHROSCOPY Left 04/2017  . COLONOSCOPY WITH PROPOFOL N/A 01/07/2019   Procedure: COLONOSCOPY WITH PROPOFOL;  Surgeon: Lin Landsman, MD;  Location: [place];  Service: Endoscopy;  Laterality: N/A;  . ESOPHAGOGASTRODUODENOSCOPY (EGD) WITH PROPOFOL N/A 01/07/2019   Procedure: ESOPHAGOGASTRODUODENOSCOPY (EGD) WITH BIOPSIES;  Surgeon: Lin Landsman, MD;  Location: [place];  Service: Endoscopy;  Laterality: N/A;  . MENISCUS REPAIR  2002   left     Medications:  Current Outpatient Medications on File Prior to Visit  Medication Sig  . CANNABIDIOL PO Take by mouth as needed.  Marland Kitchen GOODSENSE PSYLLIUM FIBER PO Take 1 each by mouth daily.  . imiquimod (ALDARA) 5 % cream Apply topically 3 (three) times a week.  . Multiple Vitamin (MULTIVITAMIN) tablet Take 1 tablet by mouth daily.  . Oil of Oregano 1500 MG CAPS Take 1 capsule by mouth 3 (three) times daily.  Marland Kitchen UNABLE TO FIND Take 1 tablet by mouth 3 (three) times daily. Mastic Gum  . Iron-FA-B Cmp-C-Biot-Probiotic (FUSION PLUS) CAPS Take  1 capsule by mouth daily. (Patient not taking: Reported on 07/28/2019)   No current facility-administered medications on file prior to visit.     Allergies:  No Known Allergies  Social History:  Social History   Socioeconomic History  . Marital status: Single    Spouse name: Not on file  . Number of children: Not on file  . Years of education: Not on file  . Highest education level: Not on file  Occupational History  . Not on file  Social Needs  . Financial resource strain: Not on file  . Food insecurity    Worry: Not on file    Inability: Not on file  . Transportation needs    Medical: Not on file    Non-medical: Not on file  Tobacco Use  . Smoking status: Former Research scientist (life sciences)  .  Smokeless tobacco: Former Systems developer    Types: Chew    Quit date: 2010  . Tobacco comment: smoked socially in college  Substance and Sexual Activity  . Alcohol use: No    Comment: pt states he has not drank in the last 5 months   . 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 Sexual activity: Not Currently  Lifestyle  . Physical activity    Days per week: Not on file    Minutes per session: Not on file  . Stress: Not on file  Relationships  . Social Herbalist on phone: Not on file    Gets together: Not on file    Attends religious service: Not on file    Active member of club or organization: Not on file    Attends meetings of clubs or organizations: Not on file    Relationship status: Not on file  . Intimate partner violence    Fear of current or ex partner: Not on file    Emotionally abused: Not on file    Physically abused: Not on file    Forced sexual activity: Not on file  Other Topics Concern  . Not on file  Social History Narrative  . Not on file   Social History   Tobacco Use  Smoking Status Former Smoker  Smokeless Tobacco Former Systems developer  . Types: Chew  . Quit date: 2010  Tobacco Comment   smoked socially in college   Social History   Substance and Sexual Activity  Alcohol Use No   Comment: pt states he has not drank in the last 5 months     Family History:  Family History  Problem Relation Age of Onset  . Cancer Mother        skin  . Alzheimer's disease Maternal Grandfather     Past medical history, surgical history, medications, allergies, family history and social history reviewed with patient today and changes made to appropriate areas of the chart.   Review of Systems - negative All other ROS negative except what is listed above and in the HPI.      Objective:    BP 113/73   Pulse 73   Temp 98.5 F (36.9 C) (Oral)   Ht 5\' 9"  (1.753 m)   Wt 206 lb (93.4 kg)   SpO2 98%    BMI 30.42 kg/m   Wt Readings from Last 3 Encounters:  07/28/19 206 lb (93.4 kg)  01/07/19 199 lb (90.3 kg)  12/05/18 199 lb 9.6 oz (90.5 kg)  Physical Exam Vitals signs and nursing note reviewed.  Constitutional:      General: He is awake. He is not in acute distress.    Appearance: He is well-developed. He is not ill-appearing.  HENT:     Head: Normocephalic and atraumatic.     Right Ear: Hearing, tympanic membrane, ear canal and external ear normal. No drainage.     Left Ear: Hearing, tympanic membrane, ear canal and external ear normal. No drainage.     Nose: Nose normal.     Mouth/Throat:     Mouth: Mucous membranes are moist.     Pharynx: Uvula midline.  Eyes:     General: Lids are normal.        Right eye: No discharge.        Left eye: No discharge.     Extraocular Movements: Extraocular movements intact.     Conjunctiva/sclera: Conjunctivae normal.     Pupils: Pupils are equal, round, and reactive to light.     Visual Fields: Right eye visual fields normal and left eye visual fields normal.  Neck:     Musculoskeletal: Normal range of motion and neck supple.     Thyroid: No thyromegaly.     Vascular: No carotid bruit.  Cardiovascular:     Rate and Rhythm: Normal rate and regular rhythm.     Heart sounds: Normal heart sounds, S1 normal and S2 normal. No murmur. No gallop.   Pulmonary:     Effort: Pulmonary effort is normal. No accessory muscle usage or respiratory distress.     Breath sounds: Normal breath sounds.  Abdominal:     General: Bowel sounds are normal.     Palpations: Abdomen is soft. There is no hepatomegaly or splenomegaly.     Tenderness: There is no abdominal tenderness.  Genitourinary:    Comments: Deferred per patient request today. Musculoskeletal: Normal range of motion.     Right lower leg: No edema.     Left lower leg: No edema.  Lymphadenopathy:     Head:     Right side of head: No submental, submandibular, tonsillar, preauricular or  posterior auricular adenopathy.     Left side of head: No submental, submandibular, tonsillar, preauricular or posterior auricular adenopathy.     Cervical: No cervical adenopathy.  Skin:    General: Skin is warm and dry.     Capillary Refill: Capillary refill takes less than 2 seconds.     Findings: No rash.  Neurological:     Mental Status: He is alert and oriented to person, place, and time.     Cranial Nerves: Cranial nerves are intact.     Gait: Gait is intact.     Deep Tendon Reflexes: Reflexes are normal and symmetric.  Psychiatric:        Mood and Affect: Mood normal.        Behavior: Behavior normal. Behavior is cooperative.        Thought Content: Thought content normal.        Judgment: Judgment normal.    Results for orders placed or performed in visit on 05/20/19  Anemia panel  Result Value Ref Range   Total Iron Binding Capacity 272 250 - 450 ug/dL   UIBC 157 111 - 343 ug/dL   Iron 115 38 - 169 ug/dL   Iron Saturation 42 15 - 55 %   Vitamin B-12 423 232 - 1,245 pg/mL   Folate, Hemolysate 451.0 Not Estab. ng/mL   Hematocrit 44.2  37.5 - 51.0 %   Folate, RBC 1,020 >498 ng/mL   Ferritin 112 30 - 400 ng/mL   Retic Ct Pct 1.4 0.6 - 2.6 %  Comprehensive metabolic panel  Result Value Ref Range   Glucose 94 65 - 99 mg/dL   BUN 11 6 - 24 mg/dL   Creatinine, Ser 1.07 0.76 - 1.27 mg/dL   GFR calc non Af Amer 86 >59 mL/min/1.73   GFR calc Af Amer 100 >59 mL/min/1.73   BUN/Creatinine Ratio 10 9 - 20   Sodium 141 134 - 144 mmol/L   Potassium 4.6 3.5 - 5.2 mmol/L   Chloride 103 96 - 106 mmol/L   CO2 23 20 - 29 mmol/L   Calcium 9.6 8.7 - 10.2 mg/dL   Total Protein 7.0 6.0 - 8.5 g/dL   Albumin 4.7 4.0 - 5.0 g/dL   Globulin, Total 2.3 1.5 - 4.5 g/dL   Albumin/Globulin Ratio 2.0 1.2 - 2.2   Bilirubin Total 0.8 0.0 - 1.2 mg/dL   Alkaline Phosphatase 63 39 - 117 IU/L   AST 17 0 - 40 IU/L   ALT 21 0 - 44 IU/L  Helicobacter pylori abs-IgG+IgA, bld  Result Value Ref Range    H. pylori, IgA Abs 19.0 (H) 0.0 - 8.9 units   H. pylori, IgG AbS 7.70 (H) 0.00 - 0.79 Index Value  Thyroid Panel With TSH  Result Value Ref Range   TSH 2.460 0.450 - 4.500 uIU/mL   T4, Total 8.0 4.5 - 12.0 ug/dL   T3 Uptake Ratio 26 24 - 39 %   Free Thyroxine Index 2.1 1.2 - 4.9  CBC with Differential/Platelet  Result Value Ref Range   WBC 6.0 3.4 - 10.8 x10E3/uL   RBC 4.97 4.14 - 5.80 x10E6/uL   Hemoglobin 15.5 13.0 - 17.7 g/dL   MCV 89 79 - 97 fL   MCH 31.2 26.6 - 33.0 pg   MCHC 35.1 31.5 - 35.7 g/dL   RDW 12.7 11.6 - 15.4 %   Platelets 247 150 - 450 x10E3/uL   Neutrophils 63 Not Estab. %   Lymphs 27 Not Estab. %   Monocytes 7 Not Estab. %   Eos 3 Not Estab. %   Basos 0 Not Estab. %   Neutrophils Absolute 3.8 1.4 - 7.0 x10E3/uL   Lymphocytes Absolute 1.6 0.7 - 3.1 x10E3/uL   Monocytes Absolute 0.4 0.1 - 0.9 x10E3/uL   EOS (ABSOLUTE) 0.2 0.0 - 0.4 x10E3/uL   Basophils Absolute 0.0 0.0 - 0.2 x10E3/uL   Immature Granulocytes 0 Not Estab. %   Immature Grans (Abs) 0.0 0.0 - 0.1 x10E3/uL      Assessment & Plan:   Problem List Items Addressed This Visit      Other   HPV (human papilloma virus) infection    Referral to dermatology per patient request for further recommendations for treatment as Aldara not effective at this time.      Iron deficiency anemia    Reports overall improvement since GI intervention . Recheck anemia panel and CBC today.      Relevant Orders   CBC with Differential/Platelet   Anemia panel    Other Visit Diagnoses    Annual physical exam    -  Primary   Relevant Orders   Comprehensive metabolic panel   Lipid Panel w/o Chol/HDL Ratio   TSH   PSA       Discussed aspirin prophylaxis for myocardial infarction prevention and decision was it was not  indicated  LABORATORY TESTING:  Health maintenance labs ordered today as discussed above.   The natural history of prostate cancer and ongoing controversy regarding screening and potential  treatment outcomes of prostate cancer has been discussed with the patient. The meaning of a false positive PSA and a false negative PSA has been discussed. He indicates understanding of the limitations of this screening test and wishes to proceed with screening PSA testing.   IMMUNIZATIONS:   - Tdap: Tetanus vaccination status reviewed: last tetanus booster within 10 years. - Influenza: Up to date - Pneumovax: Not applicable - Prevnar: Not applicable - Zostavax vaccine: Not applicable  SCREENING: - Colonoscopy: Up to date  Discussed with patient purpose of the colonoscopy is to detect colon cancer at curable precancerous or early stages   - AAA Screening: Not applicable  -Hearing Test: Not applicable  -Spirometry: Not applicable   PATIENT COUNSELING:    Sexuality: Discussed sexually transmitted diseases, partner selection, use of condoms, avoidance of unintended pregnancy  and contraceptive alternatives.   Advised to avoid cigarette smoking.  I discussed with the patient that most people either abstain from alcohol or drink within safe limits (<=14/week and <=4 drinks/occasion for males, <=7/weeks and <= 3 drinks/occasion for females) and that the risk for alcohol disorders and other health effects rises proportionally with the number of drinks per week and how often a drinker exceeds daily limits.  Discussed cessation/primary prevention of drug use and availability of treatment for abuse.   Diet: Encouraged to adjust caloric intake to maintain  or achieve ideal body weight, to reduce intake of dietary saturated fat and total fat, to limit sodium intake by avoiding high sodium foods and not adding table salt, and to maintain adequate dietary potassium and calcium preferably from fresh fruits, vegetables, and low-fat dairy products.    stressed the importance of regular exercise  Injury prevention: Discussed safety belts, safety helmets, smoke detector, smoking near bedding or  upholstery.   Dental health: Discussed importance of regular tooth brushing, flossing, and dental visits.   Follow up plan: NEXT PREVENTATIVE PHYSICAL DUE IN 1 YEAR. Return in about 6 months (around 01/26/2020) for Follow-up.

## 2019-07-28 NOTE — Assessment & Plan Note (Signed)
Reports overall improvement since GI intervention . Recheck anemia panel and CBC today.

## 2019-07-28 NOTE — Assessment & Plan Note (Signed)
Referral to dermatology per patient request for further recommendations for treatment as Aldara not effective at this time.

## 2019-07-28 NOTE — Patient Instructions (Addendum)
Anemia  Anemia is a condition in which you do not have enough red blood cells or hemoglobin. Hemoglobin is a substance in red blood cells that carries oxygen. When you do not have enough red blood cells or hemoglobin (are anemic), your body cannot get enough oxygen and your organs may not work properly. As a result, you may feel very tired or have other problems. What are the causes? Common causes of anemia include:  Excessive bleeding. Anemia can be caused by excessive bleeding inside or outside the body, including bleeding from the intestine or from periods in women.  Poor nutrition.  Long-lasting (chronic) kidney, thyroid, and liver disease.  Bone marrow disorders.  Cancer and treatments for cancer.  HIV (human immunodeficiency virus) and AIDS (acquired immunodeficiency syndrome).  Treatments for HIV and AIDS.  Spleen problems.  Blood disorders.  Infections, medicines, and autoimmune disorders that destroy red blood cells. What are the signs or symptoms? Symptoms of this condition include:  Minor weakness.  Dizziness.  Headache.  Feeling heartbeats that are irregular or faster than normal (palpitations).  Shortness of breath, especially with exercise.  Paleness.  Cold sensitivity.  Indigestion.  Nausea.  Difficulty sleeping.  Difficulty concentrating. Symptoms may occur suddenly or develop slowly. If your anemia is mild, you may not have symptoms. How is this diagnosed? This condition is diagnosed based on:  Blood tests.  Your medical history.  A physical exam.  Bone marrow biopsy. Your health care provider may also check your stool (feces) for blood and may do additional testing to look for the cause of your bleeding. You may also have other tests, including:  Imaging tests, such as a CT scan or MRI.  Endoscopy.  Colonoscopy. How is this treated? Treatment for this condition depends on the cause. If you continue to lose a lot of blood, you may  need to be treated at a hospital. Treatment may include:  Taking supplements of iron, vitamin S31, or folic acid.  Taking a hormone medicine (erythropoietin) that can help to stimulate red blood cell growth.  Having a blood transfusion. This may be needed if you lose a lot of blood.  Making changes to your diet.  Having surgery to remove your spleen. Follow these instructions at home:  Take over-the-counter and prescription medicines only as told by your health care provider.  Take supplements only as told by your health care provider.  Follow any diet instructions that you were given.  Keep all follow-up visits as told by your health care provider. This is important. Contact a health care provider if:  You develop new bleeding anywhere in the body. Get help right away if:  You are very weak.  You are short of breath.  You have pain in your abdomen or chest.  You are dizzy or feel faint.  You have trouble concentrating.  You have bloody or black, tarry stools.  You vomit repeatedly or you vomit up blood. Summary  Anemia is a condition in which you do not have enough red blood cells or enough of a substance in your red blood cells that carries oxygen (hemoglobin).  Symptoms may occur suddenly or develop slowly.  If your anemia is mild, you may not have symptoms.  This condition is diagnosed with blood tests as well as a medical history and physical exam. Other tests may be needed.  Treatment for this condition depends on the cause of the anemia. This information is not intended to replace advice given to you by  your health care provider. Make sure you discuss any questions you have with your health care provider. Document Released: 11/15/2004 Document Revised: 09/20/2017 Document Reviewed: 11/09/2016 Elsevier Patient Education  2020 Elsevier Inc.  

## 2019-07-28 NOTE — Addendum Note (Signed)
Addended by: Marnee Guarneri T on: 07/28/2019 02:13 PM   Modules accepted: Orders

## 2019-07-30 LAB — COMPREHENSIVE METABOLIC PANEL
ALT: 14 IU/L (ref 0–44)
AST: 15 IU/L (ref 0–40)
Albumin/Globulin Ratio: 2.3 — ABNORMAL HIGH (ref 1.2–2.2)
Albumin: 4.9 g/dL (ref 4.0–5.0)
Alkaline Phosphatase: 68 IU/L (ref 39–117)
BUN/Creatinine Ratio: 12 (ref 9–20)
BUN: 12 mg/dL (ref 6–24)
Bilirubin Total: 0.4 mg/dL (ref 0.0–1.2)
CO2: 21 mmol/L (ref 20–29)
Calcium: 9.5 mg/dL (ref 8.7–10.2)
Chloride: 104 mmol/L (ref 96–106)
Creatinine, Ser: 0.99 mg/dL (ref 0.76–1.27)
GFR calc Af Amer: 110 mL/min/{1.73_m2} (ref >59)
GFR calc non Af Amer: 95 mL/min/{1.73_m2} (ref >59)
Globulin, Total: 2.1 g/dL (ref 1.5–4.5)
Glucose: 87 mg/dL (ref 65–99)
Potassium: 4.3 mmol/L (ref 3.5–5.2)
Sodium: 142 mmol/L (ref 134–144)
Total Protein: 7 g/dL (ref 6.0–8.5)

## 2019-07-30 LAB — LIPID PANEL W/O CHOL/HDL RATIO
Cholesterol, Total: 137 mg/dL (ref 100–199)
HDL: 32 mg/dL — ABNORMAL LOW (ref >39)
LDL Chol Calc (NIH): 82 mg/dL (ref 0–99)
Triglycerides: 127 mg/dL (ref 0–149)
VLDL Cholesterol Cal: 23 mg/dL (ref 5–40)

## 2019-07-30 LAB — ANEMIA PANEL
Ferritin: 124 ng/mL (ref 30–400)
Folate, Hemolysate: 477 ng/mL
Folate, RBC: 1097 ng/mL (ref >498)
Hematocrit: 43.5 % (ref 37.5–51.0)
Iron Saturation: 25 % (ref 15–55)
Iron: 68 ug/dL (ref 38–169)
Retic Ct Pct: 1 % (ref 0.6–2.6)
Total Iron Binding Capacity: 275 ug/dL (ref 250–450)
UIBC: 207 ug/dL (ref 111–343)
Vitamin B-12: 466 pg/mL (ref 232–1245)

## 2019-07-30 LAB — CBC WITH DIFFERENTIAL/PLATELET
Basophils Absolute: 0 10*3/uL (ref 0.0–0.2)
Basos: 1 %
EOS (ABSOLUTE): 0.1 10*3/uL (ref 0.0–0.4)
Eos: 1 %
Hemoglobin: 14.7 g/dL (ref 13.0–17.7)
Immature Grans (Abs): 0 10*3/uL (ref 0.0–0.1)
Immature Granulocytes: 0 %
Lymphocytes Absolute: 2.1 10*3/uL (ref 0.7–3.1)
Lymphs: 36 %
MCH: 30.5 pg (ref 26.6–33.0)
MCHC: 33.8 g/dL (ref 31.5–35.7)
MCV: 90 fL (ref 79–97)
Monocytes Absolute: 0.3 10*3/uL (ref 0.1–0.9)
Monocytes: 4 %
Neutrophils Absolute: 3.5 10*3/uL (ref 1.4–7.0)
Neutrophils: 58 %
Platelets: 260 10*3/uL (ref 150–450)
RBC: 4.82 x10E6/uL (ref 4.14–5.80)
RDW: 12.6 % (ref 11.6–15.4)
WBC: 5.9 10*3/uL (ref 3.4–10.8)

## 2019-07-30 LAB — TSH: TSH: 2.79 u[IU]/mL (ref 0.450–4.500)

## 2019-07-30 LAB — PSA: Prostate Specific Ag, Serum: 1 ng/mL (ref 0.0–4.0)

## 2019-09-07 DIAGNOSIS — D239 Other benign neoplasm of skin, unspecified: Secondary | ICD-10-CM

## 2019-09-07 DIAGNOSIS — Z86018 Personal history of other benign neoplasm: Secondary | ICD-10-CM

## 2019-09-07 HISTORY — DX: Personal history of other benign neoplasm: Z86.018

## 2019-09-07 HISTORY — DX: Other benign neoplasm of skin, unspecified: D23.9

## 2019-10-29 ENCOUNTER — Ambulatory Visit: Payer: 59 | Attending: Internal Medicine

## 2019-10-29 DIAGNOSIS — Z20822 Contact with and (suspected) exposure to covid-19: Secondary | ICD-10-CM

## 2019-10-31 LAB — NOVEL CORONAVIRUS, NAA: SARS-CoV-2, NAA: NOT DETECTED

## 2019-11-25 ENCOUNTER — Ambulatory Visit (INDEPENDENT_AMBULATORY_CARE_PROVIDER_SITE_OTHER)
Admission: RE | Admit: 2019-11-25 | Discharge: 2019-11-25 | Disposition: A | Discharge status: Home / Self Care | Payer: 59 | Source: Ambulatory Visit

## 2019-11-25 DIAGNOSIS — R21 Rash and other nonspecific skin eruption: Secondary | ICD-10-CM | POA: Diagnosis not present

## 2019-11-25 DIAGNOSIS — L299 Pruritus, unspecified: Secondary | ICD-10-CM

## 2019-11-25 DIAGNOSIS — Z2089 Contact with and (suspected) exposure to other communicable diseases: Secondary | ICD-10-CM

## 2019-11-25 DIAGNOSIS — Z207 Contact with and (suspected) exposure to pediculosis, acariasis and other infestations: Secondary | ICD-10-CM

## 2019-11-25 MED ORDER — IVERMECTIN 3 MG PO TABS: ORAL_TABLET | ORAL | 0 refills | Status: DC

## 2019-11-25 MED ORDER — HYDROXYZINE HCL 25 MG PO TABS: 12.5000 mg | ORAL_TABLET | Freq: Three times a day (TID) | ORAL | 0 refills | Status: DC | PRN

## 2019-11-25 NOTE — ED Provider Notes (Signed)
Virtual Visit via Video Note:  [name]  initiated request for Telemedicine visit with [facility] team. I connected with [name]  on 11/25/2019 at 11:33 AM  for a synchronized telemedicine visit using a video enabled HIPPA compliant telemedicine application. I verified that I am speaking with [name]  using two identifiers. Jaynee Eagles, PA-C  was physically located in a [facility] Urgent care site and Waid Dolson was located at a different location.   The limitations of evaluation and management by telemedicine as well as the availability of in-person appointments were discussed. Patient was informed that he  may incur a bill ( including co-pay) for this virtual visit encounter. [name]  expressed understanding and gave verbal consent to proceed with virtual visit.    History of Present Illness:Eric Kline  is a 39 y.o. male presents with several day hx of persistent itchy rash over his torso, back, arms and legs. Sx started after they adopted a stray cat. Has never had allergies to cats or dogs. Of note, his son also had similar rash, was seeing by pediatrician, was dx with scabies.  They used permethrin shampoo but he could not tolerate this. They then go a script for ivermectin and has had significant improvement. They did a home check for bed bugs but did not find any.    Review of Systems  Constitutional: Negative for fever and malaise/fatigue.  HENT: Negative for congestion, ear pain, sinus pain and sore throat.   Eyes: Negative for discharge and redness.  Respiratory: Negative for cough, hemoptysis, shortness of breath and wheezing.   Cardiovascular: Negative for chest pain.  Gastrointestinal: Negative for abdominal pain, diarrhea, nausea and vomiting.  Genitourinary: Negative for dysuria, flank pain and hematuria.  Musculoskeletal: Negative for myalgias.  Skin: Positive for itching and rash.  Neurological: Negative for dizziness, weakness and  headaches.  Psychiatric/Behavioral: Negative for depression and substance abuse.    No current facility-administered medications for this encounter.   Current Outpatient Medications  Medication Sig Dispense Refill  . CANNABIDIOL PO Take by mouth as needed.    Marland Kitchen GOODSENSE PSYLLIUM FIBER PO Take 1 each by mouth daily.    . imiquimod (ALDARA) 5 % cream Apply topically 3 (three) times a week. 12 each 2  . Iron-FA-B Cmp-C-Biot-Probiotic (FUSION PLUS) CAPS Take 1 capsule by mouth daily. (Patient not taking: Reported on 07/28/2019) 30 capsule 2  . Multiple Vitamin (MULTIVITAMIN) tablet Take 1 tablet by mouth daily.    . Oil of Oregano 1500 MG CAPS Take 1 capsule by mouth 3 (three) times daily.    Marland Kitchen UNABLE TO FIND Take 1 tablet by mouth 3 (three) times daily. Mastic Gum       No Known Allergies   Past Medical History:  Diagnosis Date  . Allergy   . Concussion    x3  . Iron deficiency anemia   . Wears contact lenses     Past Surgical History:  Procedure Laterality Date  . ANKLE ARTHROSCOPY Left 04/2017  . COLONOSCOPY WITH PROPOFOL N/A 01/07/2019   Procedure: COLONOSCOPY WITH PROPOFOL;  Surgeon: Lin Landsman, MD;  Location: [place];  Service: Endoscopy;  Laterality: N/A;  . ESOPHAGOGASTRODUODENOSCOPY (EGD) WITH PROPOFOL N/A 01/07/2019   Procedure: ESOPHAGOGASTRODUODENOSCOPY (EGD) WITH BIOPSIES;  Surgeon: Lin Landsman, MD;  Location: [place];  Service: Endoscopy;  Laterality: N/A;  . MENISCUS REPAIR  2002   left       Observations/Objective: Physical Exam Constitutional:  General: He is not in acute distress.    Appearance: Normal appearance. He is not ill-appearing, toxic-appearing or diaphoretic.  HENT:     Head: Atraumatic.  Eyes:     General:        Right eye: No discharge.        Left eye: No discharge.     Extraocular Movements: Extraocular movements intact.  Pulmonary:     Effort: Pulmonary effort is normal.  Skin:    Comments:  Difficult to visualize on video. Has some nodular lesions on chest, only able to visualize this.   Neurological:     Mental Status: He is alert and oriented to person, place, and time.  Psychiatric:        Mood and Affect: Mood normal.        Behavior: Behavior normal.        Thought Content: Thought content normal.        Judgment: Judgment normal.      Assessment and Plan:  1. Exposure to scabies   2. Rash and nonspecific skin eruption   3. Itching    Will cover for scabies. Hydroxyzine for itching. Counseled patient on potential for adverse effects with medications prescribed/recommended today, ER and return-to-clinic precautions discussed, patient verbalized understanding.    Follow Up Instructions:    I discussed the assessment and treatment plan with the patient. The patient was provided an opportunity to ask questions and all were answered. The patient agreed with the plan and demonstrated an understanding of the instructions.   The patient was advised to call back or seek an in-person evaluation if the symptoms worsen or if the condition fails to improve as anticipated.  I provided 10 minutes of non-face-to-face time during this encounter.    Jaynee Eagles, PA-C  11/25/2019 11:33 AM       Jaynee Eagles, PA-C 11/25/19 1301

## 2019-12-29 ENCOUNTER — Telehealth (INDEPENDENT_AMBULATORY_CARE_PROVIDER_SITE_OTHER): Payer: 59 | Admitting: Nurse Practitioner

## 2019-12-29 ENCOUNTER — Ambulatory Visit: Payer: 59 | Admitting: Nurse Practitioner

## 2019-12-29 ENCOUNTER — Encounter: Payer: Self-pay | Admitting: Nurse Practitioner

## 2019-12-29 DIAGNOSIS — A048 Other specified bacterial intestinal infections: Secondary | ICD-10-CM | POA: Diagnosis not present

## 2019-12-29 DIAGNOSIS — Z9889 Other specified postprocedural states: Secondary | ICD-10-CM

## 2019-12-29 MED ORDER — CLARITHROMYCIN 500 MG PO TABS: 500.0000 mg | ORAL_TABLET | Freq: Two times a day (BID) | ORAL | 0 refills | Status: AC

## 2019-12-29 MED ORDER — OMEPRAZOLE 40 MG PO CPDR: 40.0000 mg | DELAYED_RELEASE_CAPSULE | Freq: Two times a day (BID) | ORAL | 0 refills | Status: DC

## 2019-12-29 MED ORDER — AMOXICILLIN 500 MG PO TABS: 1000.0000 mg | ORAL_TABLET | Freq: Two times a day (BID) | ORAL | 0 refills | Status: AC

## 2019-12-29 MED ORDER — FUSION PLUS PO CAPS: 1.0000 | ORAL_CAPSULE | Freq: Every day | ORAL | 2 refills | Status: DC

## 2019-12-29 NOTE — Assessment & Plan Note (Signed)
Acute with similar symptoms to previous H. Pylori infections, unable to get in for labs until next week.  At this time will treat with Clindamycin, Amoxicillin, and Prilosec (this is has worked in past).  Script sent.  Will continue to monitor, if recurrent infection will change abx regimen. Last infection 8 months ago.  Return in 2 weeks or sooner if any worsening symptoms.  Try to obtain outpatient labs next week if possible.

## 2019-12-29 NOTE — Progress Notes (Signed)
Pulse 64   Temp 98.4 F (36.9 C)    Subjective:    Patient ID: Eric Kline, male    DOB: July 01, 1981, 39 y.o.   MRN: [id number]  HPI: Eric Kline is a 39 y.o. male  Chief Complaint  Patient presents with  . h pylori    Patient states that he thinks that it has come back he is having bloating, fatigue, joint pain and was having some insomnia  . Ankle Pain    . This visit was completed via MyChart due to the restrictions of the COVID-19 pandemic. All issues as above were discussed and addressed. Physical exam was done as above through visual confirmation on MyChart. If it was felt that the patient should be evaluated in the office, they were directed there. The patient verbally consented to this visit. . Location of the patient: home . Location of the provider: work . Those involved with this call:  . Provider: Marnee Guarneri, DNP . CMA: Yvonna Alanis, CMA . Front Desk/Registration: Don Perking  . Time spent on call: 15 minutes with patient face to face via video conference. More than 50% of this time was spent in counseling and coordination of care. 10 minutes total spent in review of patient's record and preparation of their chart.  . I verified patient identity using two factors (patient name and date of birth). Patient consents verbally to being seen via telemedicine visit today.    ANKLE PAIN LEFT SIDE: Has ankle impingement syndrome, history of surgery with Dr. Sharol Given in 2018.  Feels like arch on left foot is collapsing.  Reports that after surgery his goal was to run again, but this has never happened. Mobility has never returned. Would like to return to see ortho. Duration: months Involved ankle: left Mechanism of injury: unknown Location:  Onset: gradual  Severity: 7/10  Quality:  dull, aching and throbbing Frequency: not bad during day, not pain free/tolerable (1-2/10), is worse at night Radiation: no Aggravating factors: resting Alleviating factors:  ice, APAP and brace  Status: worse Treatments attempted: ice, heat and APAP  Relief with NSAIDs?:  mild Weakness with weight bearing or walking: no Morning stiffness: yes -- stiffness all day Swelling: no Redness: no Bruising: no Paresthesias / decreased sensation: no  Fevers:no  ABDOMINAL BLOATING On January 5th was at work and felt fine, but then later day started sweating.  Had Covid testing and this returned negative. Continued to have flu-like symptoms for 3 days.  Got tested again and this was negative.  Was put on abx, by urgent care.  Started having bloating, lethargic, and achy feeling about 3 weeks.  States this is similar to his past H. Pylori symptoms.  Is unable to get in to obtain labs until next week due to work.  Last treated on 05/22/2019.  Treated Clarithromycin, Amoxicillin, Prilosec + iron, which was beneficial and cleared episode.  Denies abdominal pain, diarrhea, or constipation. Currently he is single, lives on his own.  Has no other people in household. [place] in city, filtered water, no well water.  No recent travel overseas.   Status: fluctuating Treatments attempted: none Fever: no Nausea: no Vomiting: no Weight loss: no Decreased appetite: no Diarrhea: no Constipation: no Blood in stool: no Heartburn: yes Jaundice: no Rash: no Dysuria/urinary frequency: no Hematuria: no History of sexually transmitted disease: no Recurrent NSAID use: no   Relevant past medical, surgical, family and social history reviewed and updated as indicated. Interim medical history since our last  visit reviewed. Allergies and medications reviewed and updated.  Review of Systems  Constitutional: Positive for fatigue. Negative for activity change, diaphoresis and fever.  Respiratory: Negative for cough, chest tightness, shortness of breath and wheezing.   Cardiovascular: Negative for chest pain, palpitations and leg swelling.  Gastrointestinal: Positive for abdominal distention.  Negative for abdominal pain, blood in stool, constipation, diarrhea, nausea, rectal pain and vomiting.  Endocrine: Negative for polyphagia.  Genitourinary: Negative.   Musculoskeletal: Positive for arthralgias and myalgias.  Skin: Negative.   Neurological: Negative.   Psychiatric/Behavioral: Negative.     Per HPI unless specifically indicated above     Objective:    Pulse 64   Temp 98.4 F (36.9 C)   Wt Readings from Last 3 Encounters:  07/28/19 206 lb (93.4 kg)  01/07/19 199 lb (90.3 kg)  12/05/18 199 lb 9.6 oz (90.5 kg)    Physical Exam Vitals and nursing note reviewed.  Constitutional:      General: He is awake. He is not in acute distress.    Appearance: He is well-developed. He is not ill-appearing.  HENT:     Head: Normocephalic.     Right Ear: Hearing normal. No drainage.     Left Ear: Hearing normal. No drainage.  Eyes:     General: Lids are normal.        Right eye: No discharge.        Left eye: No discharge.     Conjunctiva/sclera: Conjunctivae normal.  Pulmonary:     Effort: Pulmonary effort is normal. No accessory muscle usage or respiratory distress.  Abdominal:     Comments: Unable to auscultate due to virtual exam only.  Denies tenderness on palpation.   Musculoskeletal:     Cervical back: Normal range of motion.  Neurological:     Mental Status: He is alert and oriented to person, place, and time.  Psychiatric:        Mood and Affect: Mood normal.        Behavior: Behavior normal. Behavior is cooperative.        Thought Content: Thought content normal.        Judgment: Judgment normal.     Results for orders placed or performed in visit on 10/29/19  Novel Coronavirus, NAA ([facility])   Specimen: Nasopharyngeal(NP) swabs in vial transport medium   NASOPHARYNGE  TESTING  Result Value Ref Range   SARS-CoV-2, NAA Not Detected Not Detected      Assessment & Plan:   Problem List Items Addressed This Visit      Other   S/P surgical  manipulation of ankle joint    Having ongoing pain with worsening to ankle.  Would like to return to ortho.  Referral placed.        Relevant Orders   Ambulatory referral to Orthopedics   H. pylori infection    Acute with similar symptoms to previous H. Pylori infections, unable to get in for labs until next week.  At this time will treat with Clindamycin, Amoxicillin, and Prilosec (this is has worked in past).  Script sent.  Will continue to monitor, if recurrent infection will change abx regimen. Last infection 8 months ago.  Return in 2 weeks or sooner if any worsening symptoms.  Try to obtain outpatient labs next week if possible.      Relevant Medications   clarithromycin (BIAXIN) 500 MG tablet   Other Relevant Orders   H Pylori, IGM, IGG, IGA AB   Comprehensive metabolic  panel   CBC with Differential/Platelet   Iron, TIBC and Ferritin Panel      I discussed the assessment and treatment plan with the patient. The patient was provided an opportunity to ask questions and all were answered. The patient agreed with the plan and demonstrated an understanding of the instructions.   The patient was advised to call back or seek an in-person evaluation if the symptoms worsen or if the condition fails to improve as anticipated.   I provided 15+ minutes of time during this encounter.  Follow up plan: Return in about 2 weeks (around 01/12/2020) for Abdominal follow-up.

## 2019-12-29 NOTE — Assessment & Plan Note (Signed)
Having ongoing pain with worsening to ankle.  Would like to return to ortho.  Referral placed.

## 2019-12-29 NOTE — Patient Instructions (Signed)
Ankle Pain The ankle joint holds your body weight and allows you to move around. Ankle pain can occur on either side or the back of one ankle or both ankles. Ankle pain may be sharp and burning or dull and aching. There may be tenderness, stiffness, redness, or warmth around the ankle. Many things can cause ankle pain, including an injury to the area and overuse of the ankle. Follow these instructions at home: Activity  Rest your ankle as told by your health care provider. Avoid any activities that cause ankle pain.  Do not use the injured limb to support your body weight until your health care provider says that you can. Use crutches as told by your health care provider.  Do exercises as told by your health care provider.  Ask your health care provider when it is safe to drive if you have a brace on your ankle. If you have a brace:  Wear the brace as told by your health care provider. Remove it only as told by your health care provider.  Loosen the brace if your toes tingle, become numb, or turn cold and blue.  Keep the brace clean.  If the brace is not waterproof: ? Do not let it get wet. ? Cover it with a watertight covering when you take a bath or shower. If you were given an elastic bandage:   Remove it when you take a bath or a shower.  Try not to move your ankle very much, but wiggle your toes from time to time. This helps to prevent swelling.  Adjust the bandage to make it more comfortable if it feels too tight.  Loosen the bandage if you have numbness or tingling in your foot or if your foot turns cold and blue. Managing pain, stiffness, and swelling   If directed, put ice on the painful area. ? If you have a removable brace or elastic bandage, remove it as told by your health care provider. ? Put ice in a plastic bag. ? Place a towel between your skin and the bag. ? Leave the ice on for 20 minutes, 2-3 times a day.  Move your toes often to avoid stiffness and to  lessen swelling.  Raise (elevate) your ankle above the level of your heart while you are sitting or lying down. General instructions  Record information about your pain. Writing down the following may be helpful for you and your health care provider: ? How often you have ankle pain. ? Where the pain is located. ? What the pain feels like.  If treatment involves wearing a prescribed shoe or insole, make sure you wear it correctly and for as long as told by your health care provider.  Take over-the-counter and prescription medicines only as told by your health care provider.  Keep all follow-up visits as told by your health care provider. This is important. Contact a health care provider if:  Your pain gets worse.  Your pain is not relieved with medicines.  You have a fever or chills.  You are having more trouble with walking.  You have new symptoms. Get help right away if:  Your foot, leg, toes, or ankle: ? Tingles or becomes numb. ? Becomes swollen. ? Turns pale or blue. Summary  Ankle pain can occur on either side or the back of one ankle or both ankles.  Ankle pain may be sharp and burning or dull and aching.  Rest your ankle as told by your health care provider.   If told, apply ice to the area.  Take over-the-counter and prescription medicines only as told by your health care provider. This information is not intended to replace advice given to you by your health care provider. Make sure you discuss any questions you have with your health care provider. Document Revised: 01/27/2019 Document Reviewed: 04/16/2018 Elsevier Patient Education  2020 Elsevier Inc.  

## 2019-12-31 ENCOUNTER — Other Ambulatory Visit: Payer: Self-pay

## 2019-12-31 ENCOUNTER — Ambulatory Visit: Payer: Self-pay

## 2019-12-31 ENCOUNTER — Ambulatory Visit: Payer: 59 | Admitting: Orthopedic Surgery

## 2019-12-31 ENCOUNTER — Encounter: Payer: Self-pay | Admitting: Orthopedic Surgery

## 2019-12-31 VITALS — Ht 69.0 in | Wt 206.0 lb

## 2019-12-31 DIAGNOSIS — M25572 Pain in left ankle and joints of left foot: Secondary | ICD-10-CM | POA: Diagnosis not present

## 2019-12-31 NOTE — Progress Notes (Signed)
Office Visit Note   Patient: Eric Kline           Date of Birth: 06-30-1981           MRN: [id number] Visit Date: 12/31/2019              Requested by: Venita Lick, NP [address] [place],  [place] [address] PCP: Venita Lick, NP  Chief Complaint  Patient presents with  . Left Ankle - Pain    04/2017 left ankle scope anterior talofibular ligament reconstruction.       HPI: Patient is a 39 year old gentleman who is 3 years status post anterior talofibular ligament reconstruction on the left with ankle arthroscopy for debridement of impingement.  Patient states that recently has been having decreased range of motion of the ankle he states he cannot run due to pain over the lateral joint line he states he feels like something is wedged inside of his ankle.  Assessment & Plan: Visit Diagnoses:  1. Pain in left ankle and joints of left foot     Plan: We will set him up for physical therapy for peroneal strengthening and fascial strengthening.  Reevaluate in 2 months.  Discussed with the patient he has increased varus alignment of the calcaneus on the left compared to the right and if this will not resolve with strengthening and fascial strengthening we would need to consider a valgus osteotomy of the calcaneus and possible ligamentous reconstruction.  Patient states that he will go to [facility] in [place] he was given a prescription.  Follow-Up Instructions: Return in about 2 months (around 03/01/2020).   Ortho Exam  Patient is alert, oriented, no adenopathy, well-dressed, normal affect, normal respiratory effort. Patient does have tenderness to palpation of the anterior talofibular ligament.  He states the pain radiates from the ankle up to his knee.  Patient states he did have meniscal surgery in 2002.  Examination patient has good pulses he has increased varus alignment of the heel on the left compared to the right with the heel visible when standing from  the front.  He does have increased laxity of the anterior talofibular ligament on the left compared to the right anterior drawer is stable without laxity.  Imaging: XR Ankle Complete Left  Result Date: 12/31/2019 Three-view radiographs of the left ankle shows a congruent mortise no joint space narrowing no osteochondral defects.  No images are attached to the encounter.  Labs:    No results found for: HGBA1C, ESRSEDRATE, CRP, LABURIC, REPTSTATUS, GRAMSTAIN, CULT, LABORGA   Lab Results  Component Value Date   ALBUMIN 4.9 07/28/2019   ALBUMIN 4.7 05/20/2019   ALBUMIN 4.0 11/07/2018    No results found for: MG No results found for: VD25OH  No results found for: PREALBUMIN CBC EXTENDED Latest Ref Rng & Units 07/28/2019 05/20/2019 11/07/2018  WBC 3.4 - 10.8 x10E3/uL 5.9 6.0 6.4  RBC 4.14 - 5.80 x10E6/uL 4.82 4.97 4.29  HGB 13.0 - 17.7 g/dL 14.7 15.5 13.4  HCT 37.5 - 51.0 % 43.5 44.2 39.5  PLT 150 - 450 x10E3/uL 260 247 213  NEUTROABS 1.4 - 7.0 x10E3/uL 3.5 3.8 4.7  LYMPHSABS 0.7 - 3.1 x10E3/uL 2.1 1.6 1.0     Body mass index is 30.42 kg/m.  Orders:  Orders Placed This Encounter  Procedures  . XR Ankle Complete Left   No orders of the defined types were placed in this encounter.    Procedures: No procedures performed  Clinical Data: No additional findings.  ROS:  All other systems negative, except as noted in the HPI. Review of Systems  Objective: Vital Signs: Ht 5\' 9"  (1.753 m)   Wt 206 lb (93.4 kg)   BMI 30.42 kg/m   Specialty Comments:  No specialty comments available.  PMFS History: Patient Active Problem List   Diagnosis Date Noted  . H. pylori infection 05/14/2019  . Iron deficiency anemia   . Rectal bleeding   . History of alcoholism ([place]) 07/25/2018  . S/P surgical manipulation of ankle joint 05/08/2017  . HPV (human papilloma virus) infection 01/15/2017  . Eczema 01/15/2017   Past Medical History:  Diagnosis Date  . Allergy   .  Concussion    x3  . Iron deficiency anemia   . Wears contact lenses     Family History  Problem Relation Age of Onset  . Cancer Mother        skin  . Alzheimer's disease Maternal Grandfather     Past Surgical History:  Procedure Laterality Date  . ANKLE ARTHROSCOPY Left 04/2017  . COLONOSCOPY WITH PROPOFOL N/A 01/07/2019   Procedure: COLONOSCOPY WITH PROPOFOL;  Surgeon: Lin Landsman, MD;  Location: [facility];  Service: Endoscopy;  Laterality: N/A;  . ESOPHAGOGASTRODUODENOSCOPY (EGD) WITH PROPOFOL N/A 01/07/2019   Procedure: ESOPHAGOGASTRODUODENOSCOPY (EGD) WITH BIOPSIES;  Surgeon: Lin Landsman, MD;  Location: [facility];  Service: Endoscopy;  Laterality: N/A;  . MENISCUS REPAIR  2002   left    Social History   Occupational History  . Not on file  Tobacco Use  . Smoking status: Former Research scientist (life sciences)  . Smokeless tobacco: Former Systems developer    Types: Chew    Quit date: 2010  . Tobacco comment: smoked socially in college  Substance and Sexual Activity  . Alcohol use: No    Comment: pt states he has not drank in the last 5 months   . 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 Sexual activity: Not Currently

## 2020-01-11 ENCOUNTER — Other Ambulatory Visit: Payer: 59

## 2020-01-11 ENCOUNTER — Other Ambulatory Visit: Payer: Self-pay

## 2020-01-11 DIAGNOSIS — A048 Other specified bacterial intestinal infections: Secondary | ICD-10-CM

## 2020-01-13 LAB — CBC WITH DIFFERENTIAL/PLATELET
Basophils Absolute: 0 10*3/uL (ref 0.0–0.2)
Basos: 0 %
EOS (ABSOLUTE): 0.1 10*3/uL (ref 0.0–0.4)
Eos: 2 %
Hematocrit: 47.5 % (ref 37.5–51.0)
Hemoglobin: 16.2 g/dL (ref 13.0–17.7)
Immature Grans (Abs): 0 10*3/uL (ref 0.0–0.1)
Immature Granulocytes: 0 %
Lymphocytes Absolute: 2 10*3/uL (ref 0.7–3.1)
Lymphs: 31 %
MCH: 31.6 pg (ref 26.6–33.0)
MCHC: 34.1 g/dL (ref 31.5–35.7)
MCV: 93 fL (ref 79–97)
Monocytes Absolute: 0.3 10*3/uL (ref 0.1–0.9)
Monocytes: 5 %
Neutrophils Absolute: 3.9 10*3/uL (ref 1.4–7.0)
Neutrophils: 62 %
Platelets: 262 10*3/uL (ref 150–450)
RBC: 5.13 x10E6/uL (ref 4.14–5.80)
RDW: 12.5 % (ref 11.6–15.4)
WBC: 6.3 10*3/uL (ref 3.4–10.8)

## 2020-01-13 LAB — COMPREHENSIVE METABOLIC PANEL
ALT: 18 IU/L (ref 0–44)
AST: 16 IU/L (ref 0–40)
Albumin/Globulin Ratio: 2.2 (ref 1.2–2.2)
Albumin: 5.1 g/dL — ABNORMAL HIGH (ref 4.0–5.0)
Alkaline Phosphatase: 77 IU/L (ref 39–117)
BUN/Creatinine Ratio: 20 (ref 9–20)
BUN: 21 mg/dL (ref 6–24)
Bilirubin Total: 0.6 mg/dL (ref 0.0–1.2)
CO2: 22 mmol/L (ref 20–29)
Calcium: 9.8 mg/dL (ref 8.7–10.2)
Chloride: 101 mmol/L (ref 96–106)
Creatinine, Ser: 1.04 mg/dL (ref 0.76–1.27)
GFR calc Af Amer: 103 mL/min/{1.73_m2} (ref >59)
GFR calc non Af Amer: 89 mL/min/{1.73_m2} (ref >59)
Globulin, Total: 2.3 g/dL (ref 1.5–4.5)
Glucose: 81 mg/dL (ref 65–99)
Potassium: 4.7 mmol/L (ref 3.5–5.2)
Sodium: 140 mmol/L (ref 134–144)
Total Protein: 7.4 g/dL (ref 6.0–8.5)

## 2020-01-13 LAB — IRON,TIBC AND FERRITIN PANEL
Ferritin: 142 ng/mL (ref 30–400)
Iron Saturation: 33 % (ref 15–55)
Iron: 99 ug/dL (ref 38–169)
Total Iron Binding Capacity: 303 ug/dL (ref 250–450)
UIBC: 204 ug/dL (ref 111–343)

## 2020-01-13 LAB — H PYLORI, IGM, IGG, IGA AB
H pylori, IgM Abs: <9 units (ref 0.0–8.9)
H. pylori, IgA Abs: 16 units — ABNORMAL HIGH (ref 0.0–8.9)
H. pylori, IgG AbS: 4.56 Index Value — ABNORMAL HIGH (ref 0.00–0.79)

## 2020-01-25 ENCOUNTER — Ambulatory Visit: Payer: 59 | Admitting: Nurse Practitioner

## 2020-01-26 ENCOUNTER — Ambulatory Visit: Payer: 59 | Admitting: Nurse Practitioner

## 2020-02-08 ENCOUNTER — Ambulatory Visit (INDEPENDENT_AMBULATORY_CARE_PROVIDER_SITE_OTHER): Payer: 59 | Admitting: Nurse Practitioner

## 2020-02-08 ENCOUNTER — Encounter: Payer: Self-pay | Admitting: Nurse Practitioner

## 2020-02-08 ENCOUNTER — Other Ambulatory Visit: Payer: Self-pay

## 2020-02-08 DIAGNOSIS — A048 Other specified bacterial intestinal infections: Secondary | ICD-10-CM

## 2020-02-08 NOTE — Patient Instructions (Signed)
Helicobacter Pylori Antibodies Test Why am I having this test? This test is used to check for a type of bacteria called Helicobacter pylori (H. pylori). H. pylori can be found in the cells that line the stomach. Having high levels of H. pylori in your stomach puts you at risk for:  Stomach ulcers and small bowel ulcers.  Long-term (chronic) inflammation of the lining of the stomach.  Ulcers in the part of the body that moves food from the mouth to the stomach (esophagus).  Stomach cancer if the infection is not treated. Most people with H. pylori in their stomach have no symptoms. Your health care provider may ask you to have this test if you have symptoms of a stomach ulcer or small bowel ulcer, such as stomach pain before or after eating, heartburn, or nausea after eating. What is being tested? This test checks your blood for antibodies to the H. pylori bacteria. Antibodies are proteins made by your immune system to fight germs and infection. The test checks for antibodies that the immune system produces in response to infection with H. pylori. What kind of sample is taken?  A blood sample is required for this test. It is usually collected by inserting a needle into a blood vessel or by sticking a finger with a small needle. Tell a health care provider about:  All medicines you are taking, including vitamins, herbs, eye drops, creams, and over-the-counter medicines. How are the results reported? Your test results will be reported as values that are categorized as positive, negative, or equivocal. Equivocal means that your results are neither positive nor negative. Your health care provider will compare your results to normal ranges that were established after testing a large group of people (reference ranges). Reference ranges may vary among labs and hospitals. For this test, common reference ranges for the two types of antibodies that may be tested are:  IgG antibodies: ? Less than 0.75  units/mL. This is negative. ? Greater than or equal to 1 unit/mL. This is positive. ? 0.75-0.99 units/mL. This is equivocal.  IgM antibodies: ? Less than or equal to 30 units/mL. This is negative. ? Greater than or equal to 40 units/mL. This is positive. ? 30.01-39.99 units/mL. This is equivocal. What do the results mean? Test results that are higher than normal, or positive, may indicate various health conditions, such as:  Short-term or long-term irritation of the stomach lining (gastritis).  Small bowel ulcer.  Stomach ulcer.  Stomach cancer. Talk with your health care provider about what your results mean. Questions to ask your health care provider Ask your health care provider, or the department that is doing the test:  When will my results be ready?  How will I get my results?  What are my treatment options?  What other tests do I need?  What are my next steps? Summary  This test is used to check for a type of bacteria called Helicobacter pylori (H. pylori). Having high levels of H. pylori in your stomach puts you at risk for ulcers in the gastrointestinal tract or stomach cancer.  This test checks your blood for antibodies to the H. pylori bacteria.  Most people with H. pylori in their stomach have no symptoms. Your health care provider may ask you to have this test if you have symptoms of a stomach ulcer or small bowel ulcer, such as stomach pain before or after eating, heartburn, or nausea after eating.  Talk with your health care provider about what  your results mean. This information is not intended to replace advice given to you by your health care provider. Make sure you discuss any questions you have with your health care provider. Document Revised: 09/20/2017 Document Reviewed: 05/21/2017 Elsevier Patient Education  [employer].

## 2020-02-08 NOTE — Assessment & Plan Note (Signed)
Improved, stable at this time.  Will continue focus on diet changes and elimination of foods that cause flares.  Return for worsening or return of symptoms.

## 2020-02-08 NOTE — Progress Notes (Signed)
BP 111/71 (BP Location: Right Arm, Patient Position: Sitting, Cuff Size: Normal)   Pulse 69   Temp 97.7 F (36.5 C) (Oral)   Wt 202 lb 8 oz (91.9 kg)   SpO2 97%   BMI 29.90 kg/m    Subjective:    Patient ID: Eric Kline, male    DOB: Jul 04, 1981, 39 y.o.   MRN: [id number]  HPI: Eric Kline is a 39 y.o. male  Chief Complaint  Patient presents with  . Follow-up    H Pylori   ABDOMINAL BLOATING H Pylori infection treated recently with improvement at this time.  Last treated on 12/29/19.  Treated Clarithromycin, Amoxicillin, Prilosec + iron, which was beneficial and cleared episode.  Denies abdominal pain, diarrhea, or constipation. Currently he is single, lives on his own. Has no other people in household. [place] in city, filtered water, no well water. No recent travel overseas. He reports with diet changes to salad and eliminating gluten and carbs, this has been beneficial.    Has had multiple infections this past year and been seen by GI. Status: fluctuating Treatments attempted: none Fever: no Nausea: no Vomiting: no Weight loss: no Decreased appetite: no Diarrhea: no Constipation: no Blood in stool: no Heartburn: no Jaundice: no Rash: no Dysuria/urinary frequency: no Hematuria: no History of sexually transmitted disease: no Recurrent NSAID use: no  Relevant past medical, surgical, family and social history reviewed and updated as indicated. Interim medical history since our last visit reviewed. Allergies and medications reviewed and updated.  Review of Systems  Constitutional: Negative for activity change, diaphoresis, fatigue and fever.  Respiratory: Negative for cough, chest tightness, shortness of breath and wheezing.   Cardiovascular: Negative for chest pain, palpitations and leg swelling.  Gastrointestinal: Negative.   Neurological: Negative.   Psychiatric/Behavioral: Negative.     Per HPI unless specifically indicated above     Objective:    BP  111/71 (BP Location: Right Arm, Patient Position: Sitting, Cuff Size: Normal)   Pulse 69   Temp 97.7 F (36.5 C) (Oral)   Wt 202 lb 8 oz (91.9 kg)   SpO2 97%   BMI 29.90 kg/m   Wt Readings from Last 3 Encounters:  02/08/20 202 lb 8 oz (91.9 kg)  12/31/19 206 lb (93.4 kg)  07/28/19 206 lb (93.4 kg)    Physical Exam Vitals and nursing note reviewed.  Constitutional:      General: He is awake. He is not in acute distress.    Appearance: He is well-developed and well-groomed. He is not ill-appearing.  HENT:     Head: Normocephalic and atraumatic.     Right Ear: Hearing normal. No drainage.     Left Ear: Hearing normal. No drainage.  Eyes:     General: Lids are normal.        Right eye: No discharge.        Left eye: No discharge.     Conjunctiva/sclera: Conjunctivae normal.     Pupils: Pupils are equal, round, and reactive to light.  Neck:     Vascular: No carotid bruit.  Cardiovascular:     Rate and Rhythm: Normal rate and regular rhythm.     Heart sounds: Normal heart sounds, S1 normal and S2 normal. No murmur. No gallop.   Pulmonary:     Effort: Pulmonary effort is normal. No accessory muscle usage or respiratory distress.     Breath sounds: Normal breath sounds.  Abdominal:     General: Bowel sounds are normal.  Palpations: Abdomen is soft.  Musculoskeletal:        General: Normal range of motion.     Cervical back: Normal range of motion and neck supple.     Right lower leg: No edema.     Left lower leg: No edema.  Skin:    General: Skin is warm and dry.     Capillary Refill: Capillary refill takes less than 2 seconds.  Neurological:     Mental Status: He is alert and oriented to person, place, and time.  Psychiatric:        Attention and Perception: Attention normal.        Mood and Affect: Mood normal.        Speech: Speech normal.        Behavior: Behavior normal. Behavior is cooperative.        Thought Content: Thought content normal.    Results for  orders placed or performed in visit on 01/11/20  Iron, TIBC and Ferritin Panel  Result Value Ref Range   Total Iron Binding Capacity 303 250 - 450 ug/dL   UIBC 204 111 - 343 ug/dL   Iron 99 38 - 169 ug/dL   Iron Saturation 33 15 - 55 %   Ferritin 142 30 - 400 ng/mL  CBC with Differential/Platelet  Result Value Ref Range   WBC 6.3 3.4 - 10.8 x10E3/uL   RBC 5.13 4.14 - 5.80 x10E6/uL   Hemoglobin 16.2 13.0 - 17.7 g/dL   Hematocrit 47.5 37.5 - 51.0 %   MCV 93 79 - 97 fL   MCH 31.6 26.6 - 33.0 pg   MCHC 34.1 31.5 - 35.7 g/dL   RDW 12.5 11.6 - 15.4 %   Platelets 262 150 - 450 x10E3/uL   Neutrophils 62 Not Estab. %   Lymphs 31 Not Estab. %   Monocytes 5 Not Estab. %   Eos 2 Not Estab. %   Basos 0 Not Estab. %   Neutrophils Absolute 3.9 1.4 - 7.0 x10E3/uL   Lymphocytes Absolute 2.0 0.7 - 3.1 x10E3/uL   Monocytes Absolute 0.3 0.1 - 0.9 x10E3/uL   EOS (ABSOLUTE) 0.1 0.0 - 0.4 x10E3/uL   Basophils Absolute 0.0 0.0 - 0.2 x10E3/uL   Immature Granulocytes 0 Not Estab. %   Immature Grans (Abs) 0.0 0.0 - 0.1 x10E3/uL  Comprehensive metabolic panel  Result Value Ref Range   Glucose 81 65 - 99 mg/dL   BUN 21 6 - 24 mg/dL   Creatinine, Ser 1.04 0.76 - 1.27 mg/dL   GFR calc non Af Amer 89 >59 mL/min/1.73   GFR calc Af Amer 103 >59 mL/min/1.73   BUN/Creatinine Ratio 20 9 - 20   Sodium 140 134 - 144 mmol/L   Potassium 4.7 3.5 - 5.2 mmol/L   Chloride 101 96 - 106 mmol/L   CO2 22 20 - 29 mmol/L   Calcium 9.8 8.7 - 10.2 mg/dL   Total Protein 7.4 6.0 - 8.5 g/dL   Albumin 5.1 (H) 4.0 - 5.0 g/dL   Globulin, Total 2.3 1.5 - 4.5 g/dL   Albumin/Globulin Ratio 2.2 1.2 - 2.2   Bilirubin Total 0.6 0.0 - 1.2 mg/dL   Alkaline Phosphatase 77 39 - 117 IU/L   AST 16 0 - 40 IU/L   ALT 18 0 - 44 IU/L  H Pylori, IGM, IGG, IGA AB  Result Value Ref Range   H. pylori, IgG AbS 4.56 (H) 0.00 - 0.79 Index Value   H. pylori, IgA  Abs 16.0 (H) 0.0 - 8.9 units   H pylori, IgM Abs <9.0 0.0 - 8.9 units        Assessment & Plan:   Problem List Items Addressed This Visit      Other   H. pylori infection    Improved, stable at this time.  Will continue focus on diet changes and elimination of foods that cause flares.  Return for worsening or return of symptoms.          Follow up plan: Return in about 6 months (around 08/09/2020) for Annual physical.

## 2020-03-01 ENCOUNTER — Ambulatory Visit: Payer: 59 | Admitting: Orthopedic Surgery

## 2020-03-11 ENCOUNTER — Ambulatory Visit: Payer: 59 | Attending: Internal Medicine

## 2020-03-11 DIAGNOSIS — Z23 Encounter for immunization: Secondary | ICD-10-CM

## 2020-03-11 NOTE — Progress Notes (Signed)
   Covid-19 Vaccination Clinic  Name:  Eric Kline    MRN: [id number] DOB: 21-Jul-1981  03/11/2020  [name] was observed post Covid-19 immunization for 15 minutes without incident. He was provided with Vaccine Information Sheet and instruction to access the V-Safe system.   [name] was instructed to call 911 with any severe reactions post vaccine: Marland Kitchen Difficulty breathing  . Swelling of face and throat  . A fast heartbeat  . A bad rash all over body  . Dizziness and weakness   Immunizations Administered    Name Date Dose VIS Date Route   Pfizer COVID-19 Vaccine 03/11/2020  9:55 AM 0.3 mL 12/16/2018 Intramuscular   Manufacturer: [place]   Lot: [id number]   [place]: [id number]

## 2020-03-11 NOTE — Progress Notes (Signed)
° °  Covid-19 Vaccination Clinic  Name:  Eric Kline    MRN: [id number] DOB: Nov 03, 1980  03/11/2020  Mr. Butt was observed post Covid-19 immunization for 15 minutes without incident. He was provided with Vaccine Information Sheet and instruction to access the V-Safe system.   [name] was instructed to call 911 with any severe reactions post vaccine:  Difficulty breathing   Swelling of face and throat   A fast heartbeat   A bad rash all over body   Dizziness and weakness   Immunizations Administered    Name Date Dose VIS Date Route   Pfizer COVID-19 Vaccine 03/11/2020  9:55 AM 0.3 mL 12/16/2018 Intramuscular   Manufacturer: [place]   Lot: [id number]   [place]: [id number]

## 2020-04-05 ENCOUNTER — Ambulatory Visit: Payer: 59 | Attending: Internal Medicine

## 2020-04-05 DIAGNOSIS — Z23 Encounter for immunization: Secondary | ICD-10-CM

## 2020-04-05 NOTE — Progress Notes (Signed)
   Covid-19 Vaccination Clinic  Name:  Eric Kline    MRN: [id number] DOB: July 04, 1981  04/05/2020  Mr. Eric Kline was observed post Covid-19 immunization for 15 minutes without incident. He was provided with Vaccine Information Sheet and instruction to access the V-Safe system.   Mr. Eric Kline was instructed to call 911 with any severe reactions post vaccine: Marland Kitchen Difficulty breathing  . Swelling of face and throat  . A fast heartbeat  . A bad rash all over body  . Dizziness and weakness   Immunizations Administered    Name Date Dose VIS Date Route   Pfizer COVID-19 Vaccine 04/05/2020  8:18 AM 0.3 mL 12/16/2018 Intramuscular   Manufacturer: [place]   Lot: [id number]   [place]: [id number]

## 2020-04-13 ENCOUNTER — Encounter: Payer: Self-pay | Admitting: Nurse Practitioner

## 2020-04-13 ENCOUNTER — Ambulatory Visit: Payer: Self-pay | Admitting: *Deleted

## 2020-04-13 ENCOUNTER — Telehealth (INDEPENDENT_AMBULATORY_CARE_PROVIDER_SITE_OTHER): Payer: 59 | Admitting: Nurse Practitioner

## 2020-04-13 VITALS — HR 64

## 2020-04-13 DIAGNOSIS — R519 Headache, unspecified: Secondary | ICD-10-CM

## 2020-04-13 DIAGNOSIS — R42 Dizziness and giddiness: Secondary | ICD-10-CM | POA: Diagnosis not present

## 2020-04-13 DIAGNOSIS — T50Z95A Adverse effect of other vaccines and biological substances, initial encounter: Secondary | ICD-10-CM | POA: Insufficient documentation

## 2020-04-13 MED ORDER — PREDNISONE 20 MG PO TABS: 40.0000 mg | ORAL_TABLET | Freq: Every day | ORAL | 0 refills | Status: AC

## 2020-04-13 NOTE — Telephone Encounter (Signed)
  Pt called in c/o still having symptoms 8 days after receiving his 2nd Pfizer vaccine. He feels his heart racing intermittently.   He gets sweaty and lightheaded.   He has been feeling very tired since the shot.  He also feels like he has a hard time getting a deep breath.   Been having intermittent headaches since the shot which is not usual for him.  See triage notes.  I made him a MyChart video appt with Marnee Guarneri, NP for today at 1:20.   He has done MyChart video visits and said he was familiar with the process.   COVID-19 questionnaire completed indicated non office visit.  I sent my notes to the office for Jolene.  Reason for Disposition . [1] Redness or red streak around the injection site AND [2] started > 48 hours after getting vaccine AND [3] no fever  (Exception: red area < 1 inch or 2.5 cm wide)    Continued symptoms for 8 days after getting the 2nd COVID vaccine.  Answer Assessment - Initial Assessment Questions 1. MAIN CONCERN OR SYMPTOM:  "What is your main concern right now?" "What question do you have?" "What's the main symptom you're worried about?" (e.g., fever, pain, redness, swelling)     I got the [place] vaccine last Tuesday morning.   Wednesday morning I woke up 4:00 AM I woke up sweaty and hot.   I slept until noon.   I had body aches.   I called out of work Wed. And Thur.  Friday we were all off. [place]  [place].   I'm so tired even with all I'm doing.   This morning I feel like my heart  Is racing.  I feel light headed occasionally.   2. VACCINE: "What vaccination did you receive?" "Is this your first or second shot?" (e.g., none; AstraZeneca, J&J, [place], other)     [employer] COVID vaccine the 2nd shot. I always get reactions after the flu shots too. 3. SYMPTOM ONSET: "When did th begin?" (e.g., not relevant; hours, days)      This day 8. 4. SYMPTOM SEVERITY: "How bad is it?"      Heart racing is intermittent.   I get  sweaty and dizzy then it goes away.  I'm very tired.  I worked out yesterday even though I was tired.   I walked 3 miles with a weighted vest on yesterday.  It takes a lot of effort to do these things. 5. FEVER: "Is there a fever?" If so, ask: "What is it, how was it measured, and when did it start?"      I had a low grade fever the morning after I got the shot. 6. PAST REACTIONS: "Have you reacted to immunizations before?" If so, ask: "What happened?"     See above 7. OTHER SYMPTOMS: "Do you have any other symptoms?"     See above  Protocols used: CORONAVIRUS (COVID-19) VACCINE QUESTIONS AND REACTIONS-A-AH

## 2020-04-13 NOTE — Progress Notes (Signed)
Pulse 64    Subjective:    Patient ID: Eric Kline, male    DOB: 1981/02/04, 39 y.o.   MRN: [id number]  HPI: Eric Kline is a 39 y.o. male  Chief Complaint  Patient presents with  . Covid Vaccine     pt states he has been having sweats, increased heart rate, tiredness, and lightheadedness since 04/05/20 when he got 2nd vaccine. See triage note also.     . This visit was completed via MyChart due to the restrictions of the COVID-19 pandemic. All issues as above were discussed and addressed. Physical exam was done as above through visual confirmation on MyChart. If it was felt that the patient should be evaluated in the office, they were directed there. The patient verbally consented to this visit. . Location of the patient: home . Location of the provider: work . Those involved with this call:  . Provider: Marnee Guarneri, DNP . CMA: Yvonna Alanis, CMA . Front Desk/Registration: Don Perking  . Time spent on call: 20 minutes with patient face to face via video conference. More than 50% of this time was spent in counseling and coordination of care. 15 minutes total spent in review of patient's record and preparation of their chart.  . I verified patient identity using two factors (patient name and date of birth). Patient consents verbally to being seen via telemedicine visit today.    COVID 19 VACCINE REACTION: Reports receiving 2nd Covid vaccine on 04/05/2020.  Started having symptoms with this vaccine on Wednesday morning, day after vaccine, had chills and diaphoresis.  Wednesday and Thursday he felt achy and fatigued, took off work.  Since the vaccine has been having intermittent episodes of feeling like heart is beating fast, fatigue, and sweats.  Feels like he is having to push extra hard to get through occasionally due to fatigue.  Returned to work on Monday.  Did telework Tuesday and Wednesday.    Has been drinking lots of water.  Took Tylenol last Wednesday, but none  since then.  Does take a multivitamin daily, iron supplement, and Vitamin C.  Tends to react strongly to flu vaccine and does not get this, but his workplace encouraged Covid vaccine.  No known allergy to Miralax or PEG.  Denies SOB, CP, wheezing, loss of taste or smell, cough, congestion.    Relevant past medical, surgical, family and social history reviewed and updated as indicated. Interim medical history since our last visit reviewed. Allergies and medications reviewed and updated.  Review of Systems  Constitutional: Positive for fatigue. Negative for activity change, diaphoresis and fever.  Respiratory: Negative for cough, chest tightness, shortness of breath and wheezing.   Cardiovascular: Negative for chest pain, palpitations and leg swelling.  Gastrointestinal: Negative.   Musculoskeletal: Positive for myalgias.  Neurological: Negative.   Psychiatric/Behavioral: Negative.     Per HPI unless specifically indicated above     Objective:    Pulse 64   Wt Readings from Last 3 Encounters:  02/08/20 202 lb 8 oz (91.9 kg)  12/31/19 206 lb (93.4 kg)  07/28/19 206 lb (93.4 kg)    Physical Exam Vitals and nursing note reviewed.  Constitutional:      General: He is awake. He is not in acute distress.    Appearance: He is well-developed. He is not ill-appearing.  HENT:     Head: Normocephalic.     Right Ear: Hearing normal. No drainage.     Left Ear: Hearing normal. No drainage.  Eyes:  General: Lids are normal.        Right eye: No discharge.        Left eye: No discharge.     Conjunctiva/sclera: Conjunctivae normal.  Cardiovascular:     Comments: Unable to auscultate due to virtual exam only  Pulmonary:     Effort: Pulmonary effort is normal. No accessory muscle usage or respiratory distress.     Comments: Talkative without SOB Musculoskeletal:     Cervical back: Normal range of motion.  Neurological:     Mental Status: He is alert and oriented to person, place, and  time.  Psychiatric:        Mood and Affect: Mood normal.        Behavior: Behavior normal. Behavior is cooperative.        Thought Content: Thought content normal.        Judgment: Judgment normal.     Results for orders placed or performed in visit on 01/11/20  Iron, TIBC and Ferritin Panel  Result Value Ref Range   Total Iron Binding Capacity 303 250 - 450 ug/dL   UIBC 204 111 - 343 ug/dL   Iron 99 38 - 169 ug/dL   Iron Saturation 33 15 - 55 %   Ferritin 142 30.0 - 400.0 ng/mL  CBC with Differential/Platelet  Result Value Ref Range   WBC 6.3 3.4 - 10.8 x10E3/uL   RBC 5.13 4.14 - 5.80 x10E6/uL   Hemoglobin 16.2 13.0 - 17.7 g/dL   Hematocrit 47.5 37.5 - 51.0 %   MCV 93 79 - 97 fL   MCH 31.6 26.6 - 33.0 pg   MCHC 34.1 31 - 35 g/dL   RDW 12.5 11.6 - 15.4 %   Platelets 262 150 - 450 x10E3/uL   Neutrophils 62 Not Estab. %   Lymphs 31 Not Estab. %   Monocytes 5 Not Estab. %   Eos 2 Not Estab. %   Basos 0 Not Estab. %   Neutrophils Absolute 3.9 1 - 7 x10E3/uL   Lymphocytes Absolute 2.0 0 - 3 x10E3/uL   Monocytes Absolute 0.3 0 - 0 x10E3/uL   EOS (ABSOLUTE) 0.1 0.0 - 0.4 x10E3/uL   Basophils Absolute 0.0 0 - 0 x10E3/uL   Immature Granulocytes 0 Not Estab. %   Immature Grans (Abs) 0.0 0.0 - 0.1 x10E3/uL  Comprehensive metabolic panel  Result Value Ref Range   Glucose 81 65 - 99 mg/dL   BUN 21 6 - 24 mg/dL   Creatinine, Ser 1.04 0.76 - 1.27 mg/dL   GFR calc non Af Amer 89 >59 mL/min/1.73   GFR calc Af Amer 103 >59 mL/min/1.73   BUN/Creatinine Ratio 20 9 - 20   Sodium 140 134 - 144 mmol/L   Potassium 4.7 3.5 - 5.2 mmol/L   Chloride 101 96 - 106 mmol/L   CO2 22 20 - 29 mmol/L   Calcium 9.8 8.7 - 10.2 mg/dL   Total Protein 7.4 6.0 - 8.5 g/dL   Albumin 5.1 (H) 4.0 - 5.0 g/dL   Globulin, Total 2.3 1.5 - 4.5 g/dL   Albumin/Globulin Ratio 2.2 1.2 - 2.2   Bilirubin Total 0.6 0.0 - 1.2 mg/dL   Alkaline Phosphatase 77 39 - 117 IU/L   AST 16 0 - 40 IU/L   ALT 18 0 - 44 IU/L    H Pylori, IGM, IGG, IGA AB  Result Value Ref Range   H. pylori, IgG AbS 4.56 (H) 0.00 - 0.79 Index Value  H. pylori, IgA Abs 16.0 (H) 0.0 - 8.9 units   H pylori, IgM Abs <9.0 0.0 - 8.9 units      Assessment & Plan:   Problem List Items Addressed This Visit      Other   Vaccine reaction - Primary    Received second Covid 19 vaccine on 04/05/2020 -- reports ongoing symptoms post vaccine.  Have recommended he attain Covid 19 testing to ensure symptoms are not viral vs effect of vaccine.  Recommend taking Tylenol or Ibuprofen daily for myalgias.  Ensure drinking plenty of fluid daily.  He denies any red flag symptoms.  Will send in script for Prednisone and recommend he start taking Claritin daily to help with symptoms, appears to be sensitive to vaccines on discussion.  Take Vitamin D3 and Zinc daily at this time until symptoms improve.  If worsening symptoms, such as CP or SOB, then go immediately to ER or UC.  Notify provider if no improvement in symptoms over next 2 days.         I discussed the assessment and treatment plan with the patient. The patient was provided an opportunity to ask questions and all were answered. The patient agreed with the plan and demonstrated an understanding of the instructions.   The patient was advised to call back or seek an in-person evaluation if the symptoms worsen or if the condition fails to improve as anticipated.   I provided 21+ minutes of time during this encounter.  Follow up plan: Return if symptoms worsen or fail to improve.

## 2020-04-13 NOTE — Patient Instructions (Signed)
COVID-19 COVID-19 is a respiratory infection that is caused by a virus called severe acute respiratory syndrome coronavirus 2 (SARS-CoV-2). The disease is also known as coronavirus disease or novel coronavirus. In some people, the virus may not cause any symptoms. In others, it may cause a serious infection. The infection can get worse quickly and can lead to complications, such as:  Pneumonia, or infection of the lungs.  Acute respiratory distress syndrome or ARDS. This is a condition in which fluid build-up in the lungs prevents the lungs from filling with air and passing oxygen into the blood.  Acute respiratory failure. This is a condition in which there is not enough oxygen passing from the lungs to the body or when carbon dioxide is not passing from the lungs out of the body.  Sepsis or septic shock. This is a serious bodily reaction to an infection.  Blood clotting problems.  Secondary infections due to bacteria or fungus.  Organ failure. This is when your body's organs stop working. The virus that causes COVID-19 is contagious. This means that it can spread from person to person through droplets from coughs and sneezes (respiratory secretions). What are the causes? This illness is caused by a virus. You may catch the virus by:  Breathing in droplets from an infected person. Droplets can be spread by a person breathing, speaking, singing, coughing, or sneezing.  Touching something, like a table or a doorknob, that was exposed to the virus (contaminated) and then touching your mouth, nose, or eyes. What increases the risk? Risk for infection You are more likely to be infected with this virus if you:  Are within 6 feet (2 meters) of a person with COVID-19.  Provide care for or live with a person who is infected with COVID-19.  Spend time in crowded indoor spaces or live in shared housing. Risk for serious illness You are more likely to become seriously ill from the virus if you:   Are 50 years of age or older. The higher your age, the more you are at risk for serious illness.  Live in a nursing home or long-term care facility.  Have cancer.  Have a long-term (chronic) disease such as: ? Chronic lung disease, including chronic obstructive pulmonary disease or asthma. ? A long-term disease that lowers your body's ability to fight infection (immunocompromised). ? Heart disease, including heart failure, a condition in which the arteries that lead to the heart become narrow or blocked (coronary artery disease), a disease which makes the heart muscle thick, weak, or stiff (cardiomyopathy). ? Diabetes. ? Chronic kidney disease. ? Sickle cell disease, a condition in which red blood cells have an abnormal "sickle" shape. ? Liver disease.  Are obese. What are the signs or symptoms? Symptoms of this condition can range from mild to severe. Symptoms may appear any time from 2 to 14 days after being exposed to the virus. They include:  A fever or chills.  A cough.  Difficulty breathing.  Headaches, body aches, or muscle aches.  Runny or stuffy (congested) nose.  A sore throat.  New loss of taste or smell. Some people may also have stomach problems, such as nausea, vomiting, or diarrhea. Other people may not have any symptoms of COVID-19. How is this diagnosed? This condition may be diagnosed based on:  Your signs and symptoms, especially if: ? You live in an area with a COVID-19 outbreak. ? You recently traveled to or from an area where the virus is common. ? You   provide care for or live with a person who was diagnosed with COVID-19. ? You were exposed to a person who was diagnosed with COVID-19.  A physical exam.  Lab tests, which may include: ? Taking a sample of fluid from the back of your nose and throat (nasopharyngeal fluid), your nose, or your throat using a swab. ? A sample of mucus from your lungs (sputum). ? Blood tests.  Imaging tests, which  may include, X-rays, CT scan, or ultrasound. How is this treated? At present, there is no medicine to treat COVID-19. Medicines that treat other diseases are being used on a trial basis to see if they are effective against COVID-19. Your health care provider will talk with you about ways to treat your symptoms. For most people, the infection is mild and can be managed at home with rest, fluids, and over-the-counter medicines. Treatment for a serious infection usually takes places in a hospital intensive care unit (ICU). It may include one or more of the following treatments. These treatments are given until your symptoms improve.  Receiving fluids and medicines through an IV.  Supplemental oxygen. Extra oxygen is given through a tube in the nose, a face mask, or a hood.  Positioning you to lie on your stomach (prone position). This makes it easier for oxygen to get into the lungs.  Continuous positive airway pressure (CPAP) or bi-level positive airway pressure (BPAP) machine. This treatment uses mild air pressure to keep the airways open. A tube that is connected to a motor delivers oxygen to the body.  Ventilator. This treatment moves air into and out of the lungs by using a tube that is placed in your windpipe.  Tracheostomy. This is a procedure to create a hole in the neck so that a breathing tube can be inserted.  Extracorporeal membrane oxygenation (ECMO). This procedure gives the lungs a chance to recover by taking over the functions of the heart and lungs. It supplies oxygen to the body and removes carbon dioxide. Follow these instructions at home: Lifestyle  If you are sick, stay home except to get medical care. Your health care provider will tell you how long to stay home. Call your health care provider before you go for medical care.  Rest at home as told by your health care provider.  Do not use any products that contain nicotine or tobacco, such as cigarettes, e-cigarettes, and  chewing tobacco. If you need help quitting, ask your health care provider.  Return to your normal activities as told by your health care provider. Ask your health care provider what activities are safe for you. General instructions  Take over-the-counter and prescription medicines only as told by your health care provider.  Drink enough fluid to keep your urine pale yellow.  Keep all follow-up visits as told by your health care provider. This is important. How is this prevented?  There is no vaccine to help prevent COVID-19 infection. However, there are steps you can take to protect yourself and others from this virus. To protect yourself:   Do not travel to areas where COVID-19 is a risk. The areas where COVID-19 is reported change often. To identify high-risk areas and travel restrictions, check the CDC travel website: wwwnc.cdc.gov/travel/notices  If you live in, or must travel to, an area where COVID-19 is a risk, take precautions to avoid infection. ? Stay away from people who are sick. ? Wash your hands often with soap and water for 20 seconds. If soap and water   are not available, use an alcohol-based hand sanitizer. ? Avoid touching your mouth, face, eyes, or nose. ? Avoid going out in public, follow guidance from your state and local health authorities. ? If you must go out in public, wear a cloth face covering or face mask. Make sure your mask covers your nose and mouth. ? Avoid crowded indoor spaces. Stay at least 6 feet (2 meters) away from others. ? Disinfect objects and surfaces that are frequently touched every day. This may include:  Counters and tables.  Doorknobs and light switches.  Sinks and faucets.  Electronics, such as phones, remote controls, keyboards, computers, and tablets. To protect others: If you have symptoms of COVID-19, take steps to prevent the virus from spreading to others.  If you think you have a COVID-19 infection, contact your health care  provider right away. Tell your health care team that you think you may have a COVID-19 infection.  Stay home. Leave your house only to seek medical care. Do not use public transport.  Do not travel while you are sick.  Wash your hands often with soap and water for 20 seconds. If soap and water are not available, use alcohol-based hand sanitizer.  Stay away from other members of your household. Let healthy household members care for children and pets, if possible. If you have to care for children or pets, wash your hands often and wear a mask. If possible, stay in your own room, separate from others. Use a different bathroom.  Make sure that all people in your household wash their hands well and often.  Cough or sneeze into a tissue or your sleeve or elbow. Do not cough or sneeze into your hand or into the air.  Wear a cloth face covering or face mask. Make sure your mask covers your nose and mouth. Where to find more information  Centers for Disease Control and Prevention: www.cdc.gov/coronavirus/2019-ncov/index.html  World Health Organization: www.who.int/health-topics/coronavirus Contact a health care provider if:  You live in or have traveled to an area where COVID-19 is a risk and you have symptoms of the infection.  You have had contact with someone who has COVID-19 and you have symptoms of the infection. Get help right away if:  You have trouble breathing.  You have pain or pressure in your chest.  You have confusion.  You have bluish lips and fingernails.  You have difficulty waking from sleep.  You have symptoms that get worse. These symptoms may represent a serious problem that is an emergency. Do not wait to see if the symptoms will go away. Get medical help right away. Call your local emergency services (911 in the [place] Do not drive yourself to the hospital. Let the emergency medical personnel know if you think you have COVID-19. Summary  COVID-19 is a  respiratory infection that is caused by a virus. It is also known as coronavirus disease or novel coronavirus. It can cause serious infections, such as pneumonia, acute respiratory distress syndrome, acute respiratory failure, or sepsis.  The virus that causes COVID-19 is contagious. This means that it can spread from person to person through droplets from breathing, speaking, singing, coughing, or sneezing.  You are more likely to develop a serious illness if you are 50 years of age or older, have a weak immune system, live in a nursing home, or have chronic disease.  There is no medicine to treat COVID-19. Your health care provider will talk with you about ways to treat your symptoms.    Take steps to protect yourself and others from infection. Wash your hands often and disinfect objects and surfaces that are frequently touched every day. Stay away from people who are sick and wear a mask if you are sick. This information is not intended to replace advice given to you by your health care provider. Make sure you discuss any questions you have with your health care provider. Document Revised: 08/07/2019 Document Reviewed: 11/13/2018 Elsevier Patient Education  2020 [employer]  

## 2020-04-13 NOTE — Assessment & Plan Note (Signed)
Received second Covid 19 vaccine on 04/05/2020 -- reports ongoing symptoms post vaccine.  Have recommended he attain Covid 19 testing to ensure symptoms are not viral vs effect of vaccine.  Recommend taking Tylenol or Ibuprofen daily for myalgias.  Ensure drinking plenty of fluid daily.  He denies any red flag symptoms.  Will send in script for Prednisone and recommend he start taking Claritin daily to help with symptoms, appears to be sensitive to vaccines on discussion.  Take Vitamin D3 and Zinc daily at this time until symptoms improve.  If worsening symptoms, such as CP or SOB, then go immediately to ER or UC.  Notify provider if no improvement in symptoms over next 2 days.

## 2020-06-16 ENCOUNTER — Ambulatory Visit: Payer: 59 | Admitting: Dermatology

## 2020-06-16 ENCOUNTER — Other Ambulatory Visit: Payer: Self-pay

## 2020-06-16 DIAGNOSIS — Z8619 Personal history of other infectious and parasitic diseases: Secondary | ICD-10-CM

## 2020-06-16 DIAGNOSIS — Z86018 Personal history of other benign neoplasm: Secondary | ICD-10-CM

## 2020-06-16 DIAGNOSIS — Z1283 Encounter for screening for malignant neoplasm of skin: Secondary | ICD-10-CM | POA: Diagnosis not present

## 2020-06-16 DIAGNOSIS — D485 Neoplasm of uncertain behavior of skin: Secondary | ICD-10-CM | POA: Diagnosis not present

## 2020-06-16 DIAGNOSIS — L819 Disorder of pigmentation, unspecified: Secondary | ICD-10-CM | POA: Diagnosis not present

## 2020-06-16 DIAGNOSIS — D229 Melanocytic nevi, unspecified: Secondary | ICD-10-CM

## 2020-06-16 DIAGNOSIS — L821 Other seborrheic keratosis: Secondary | ICD-10-CM

## 2020-06-16 DIAGNOSIS — D1800 Hemangioma unspecified site: Secondary | ICD-10-CM

## 2020-06-16 DIAGNOSIS — L578 Other skin changes due to chronic exposure to nonionizing radiation: Secondary | ICD-10-CM

## 2020-06-16 MED ORDER — TACROLIMUS 0.1 % EX OINT: TOPICAL_OINTMENT | CUTANEOUS | 2 refills | Status: DC

## 2020-06-16 NOTE — Progress Notes (Signed)
Follow-Up Visit   Subjective  Eric Kline is a 39 y.o. male who presents for the following: Hx Dysplastic Nevi (Left mid back paraspinal, Left low back 4.5cm lateral to spine, biopsy 09/07/2019.) and Hx Condyloma (Patient doesn't think he has any outbreaks now, but imiquimod cream that was used in the past he thinks changed the skin color. He would like area checked today. He also had LN2 treatment to these areas in 08/2019.). The patient presents for Upper Body Skin Exam (UBSE) for skin cancer screening and mole check.  The following portions of the chart were reviewed this encounter and updated as appropriate:  Tobacco  Allergies  Meds  Problems  Med Hx  Surg Hx  Fam Hx     Review of Systems:  No other skin or systemic complaints except as noted in HPI or Assessment and Plan.  Objective  Well appearing patient in no apparent distress; mood and affect are within normal limits.  A focused examination was performed including face, trunk, groin, extremities. Relevant physical exam findings are noted in the Assessment and Plan.  Objective  L Mid Back Paraspinal, L low back 4.5cm lat to spine: Scar with no evidence of recurrence.   Objective  Right Pectoral Lateral to Areola: 0.8 x 0.5cm irreg brown macule   Objective  Right Mid Lateral Bicep: 0.6 x 0.4cm irreg brown macule  Objective  Groin: Clear today.  Objective  Groin: Hypopigmentation.   Assessment & Plan   Skin cancer screening performed today.  Actinic Damage - diffuse scaly erythematous macules with underlying dyspigmentation - Recommend daily broad spectrum sunscreen SPF 30+ to sun-exposed areas, reapply every 2 hours as needed.  - Call for new or changing lesions.  Melanocytic Nevi - Tan-brown and/or pink-flesh-colored symmetric macules and papules - Benign appearing on exam today - Observation - Call clinic for new or changing moles - Recommend daily use of broad spectrum spf 30+ sunscreen to  sun-exposed areas.   Lentigines - Scattered tan macules - Discussed due to sun exposure - Benign, observe - Call for any changes  Actinic Damage - diffuse scaly erythematous macules with underlying dyspigmentation - Recommend daily broad spectrum sunscreen SPF 30+ to sun-exposed areas, reapply every 2 hours as needed.  - Call for new or changing lesions.  Hemangiomas - Red papules - Discussed benign nature - Observe - Call for any changes  History of dysplastic nevus L Mid Back Paraspinal, L low back 4.5cm lat to spine  Clear. Observe for recurrence. Call clinic for new or changing lesions.  Recommend regular skin exams, daily broad-spectrum spf 30+ sunscreen use, and photoprotection.     Neoplasm of uncertain behavior of skin (2) Right Pectoral Lateral to Areola  Epidermal / dermal shaving  Lesion diameter (cm):  0.8 Informed consent: discussed and consent obtained   Timeout: patient name, date of birth, surgical site, and procedure verified   Procedure prep:  Patient was prepped and draped in usual sterile fashion Prep type:  Isopropyl alcohol Anesthesia: the lesion was anesthetized in a standard fashion   Anesthetic:  1% lidocaine w/ epinephrine 1-100,000 buffered w/ 8.4% NaHCO3 Instrument used: flexible razor blade   Hemostasis achieved with: pressure, aluminum chloride and electrodesiccation   Outcome: patient tolerated procedure well   Post-procedure details: sterile dressing applied and wound care instructions given   Dressing type: bandage and petrolatum    Specimen 1 - Surgical pathology Differential Diagnosis: Nevus vs Dysplastic Nevus Check Margins: Yes 0.8 x 0.5cm irreg brown  macule  Right Mid Lateral Bicep  Epidermal / dermal shaving  Lesion diameter (cm):  0.6 Informed consent: discussed and consent obtained   Timeout: patient name, date of birth, surgical site, and procedure verified   Procedure prep:  Patient was prepped and draped in usual  sterile fashion Prep type:  Isopropyl alcohol Anesthesia: the lesion was anesthetized in a standard fashion   Anesthetic:  1% lidocaine w/ epinephrine 1-100,000 buffered w/ 8.4% NaHCO3 Instrument used: flexible razor blade   Hemostasis achieved with: pressure, aluminum chloride and electrodesiccation   Outcome: patient tolerated procedure well   Post-procedure details: sterile dressing applied and wound care instructions given   Dressing type: bandage and petrolatum    Specimen 2 - Surgical pathology Differential Diagnosis: Nevus vs Dysplastic Nevus Check Margins: Yes 0.6 x 0.4cm irreg brown macule  History of condyloma acuminatum Groin/genital Clear today. Observe for recurrence.   Depigmentation of skin Groin/genital Possibly from imiquimod use? vs Vitiligo No other areas involved on exam today Start Protopic 0.1% Ointment Apply to AAs groin/genital QD. Recommend patient take Vit A, C, and E.   Patient will be going to the [place] soon for a general check. He will have labs done, as well as Thyroid. Patient did have thyroid checked 05/20/2019. See labs under Chart Review.  tacrolimus (PROTOPIC) 0.1 % ointment - Groin  Skin cancer screening  Return in about 6 months (around 12/17/2020).   Lindi Adie, CMA, am acting as scribe for Sarina Ser, MD .  Documentation: I have reviewed the above documentation for accuracy and completeness, and I agree with the above.  Sarina Ser, MD

## 2020-06-16 NOTE — Patient Instructions (Addendum)
Wound Care Instructions  1. Cleanse wound gently with soap and water once a day then pat dry with clean gauze. Apply a thing coat of Petrolatum (petroleum jelly, "Vaseline") over the wound (unless you have an allergy to this). We recommend that you use a new, sterile tube of Vaseline. Do not pick or remove scabs. Do not remove the yellow or white "healing tissue" from the base of the wound.  2. Cover the wound with fresh, clean, nonstick gauze and secure with paper tape. You may use Band-Aids in place of gauze and tape if the would is small enough, but would recommend trimming much of the tape off as there is often too much. Sometimes Band-Aids can irritate the skin.  3. You should call the office for your biopsy report after 1 week if you have not already been contacted.  4. If you experience any problems, such as abnormal amounts of bleeding, swelling, significant bruising, significant pain, or evidence of infection, please call the office immediately.    Recommend starting Vitamin A, C, E daily.

## 2020-06-22 ENCOUNTER — Telehealth: Payer: Self-pay

## 2020-06-22 NOTE — Telephone Encounter (Signed)
-----   Message from Ralene Bathe, MD sent at 06/20/2020 12:08 PM EDT ----- 1. Skin , (A) right pectoral lateral to areola DYSPLASTIC COMPOUND NEVUS WITH MODERATE ATYPIA, CLOSE TO MARGIN 2. Skin , (B) right mid lateral bicep DYSPLASTIC COMPOUND NEVUS WITH MODERATE ATYPIA, DEEP MARGIN INVOLVED  1&2 - both dysplastic Moderate Recheck next visit

## 2020-06-22 NOTE — Telephone Encounter (Signed)
Patient informed of pathology results 

## 2020-06-24 ENCOUNTER — Encounter: Payer: Self-pay | Admitting: Dermatology

## 2020-08-16 ENCOUNTER — Encounter: Payer: Self-pay | Admitting: Nurse Practitioner

## 2020-08-16 ENCOUNTER — Ambulatory Visit (INDEPENDENT_AMBULATORY_CARE_PROVIDER_SITE_OTHER): Payer: 59 | Admitting: Nurse Practitioner

## 2020-08-16 ENCOUNTER — Other Ambulatory Visit: Payer: Self-pay

## 2020-08-16 VITALS — BP 127/86 | HR 63 | Temp 97.6°F | Resp 16 | Ht 70.0 in | Wt 208.4 lb

## 2020-08-16 DIAGNOSIS — Z13220 Encounter for screening for lipoid disorders: Secondary | ICD-10-CM | POA: Diagnosis not present

## 2020-08-16 DIAGNOSIS — Z1159 Encounter for screening for other viral diseases: Secondary | ICD-10-CM

## 2020-08-16 DIAGNOSIS — Z0000 Encounter for general adult medical examination without abnormal findings: Secondary | ICD-10-CM | POA: Diagnosis not present

## 2020-08-16 DIAGNOSIS — Z1329 Encounter for screening for other suspected endocrine disorder: Secondary | ICD-10-CM

## 2020-08-16 DIAGNOSIS — R5383 Other fatigue: Secondary | ICD-10-CM | POA: Diagnosis not present

## 2020-08-16 NOTE — Progress Notes (Signed)
BP 127/86 (BP Location: Left Arm, Patient Position: Sitting, Cuff Size: Normal)   Pulse 63   Temp 97.6 F (36.4 C) (Oral)   Resp 16   Ht 5\' 10"  (1.778 m)   Wt 208 lb 6.4 oz (94.5 kg)   SpO2 96%   BMI 29.90 kg/m    Subjective:    Patient ID: Eric Kline, male    DOB: June 12, 1981, 39 y.o.   MRN: [id number]  HPI: Eric Kline is a 39 y.o. male presenting on 08/16/2020 for comprehensive medical examination. Current medical complaints include:none  He currently lives with: self Interim Problems from his last visit: no   Requests testosterone levels be checked today, would like a baseline.  Occasional fatigue present.  Functional Status Survey: Is the patient deaf or have difficulty hearing?: No Does the patient have difficulty seeing, even when wearing glasses/contacts?: No Does the patient have difficulty concentrating, remembering, or making decisions?: No Does the patient have difficulty walking or climbing stairs?: No Does the patient have difficulty dressing or bathing?: No Does the patient have difficulty doing errands alone such as visiting a doctor's office or shopping?: No  FALL RISK: Fall Risk  08/16/2020 07/28/2019 07/25/2018 01/15/2017  Falls in the past year? 0 0 No Yes  Number falls in past yr: 0 0 - 1  Injury with Fall? 0 0 - Yes  Risk for fall due to : No Fall Risks - - -  Follow up Falls evaluation completed Falls evaluation completed - -    Depression Screen Depression screen [facility] 2/9 08/16/2020 07/28/2019 07/25/2018 07/25/2018 01/15/2017  Decreased Interest 0 0 0 0 1  Down, Depressed, Hopeless 0 0 0 0 1  PHQ - 2 Score 0 0 0 0 2  Altered sleeping - - 1 0 -  Tired, decreased energy - - 0 0 -  Change in appetite - - 0 0 -  Feeling bad or failure about yourself  - - 0 0 -  Trouble concentrating - - 0 0 -  Moving slowly or fidgety/restless - - 0 0 -  Suicidal thoughts - - 0 0 -  PHQ-9 Score - - 1 0 -    Advanced Directives <no information>  Past Medical  History:  Past Medical History:  Diagnosis Date  . Allergy   . Concussion    x3  . Hx of dysplastic nevus 09/07/2019   L mid back paraspinal, moderate atypia  . Hx of dysplastic nevus 09/07/2019   L low back 4.5cm lat to spine  . Iron deficiency anemia   . Wears contact lenses     Surgical History:  Past Surgical History:  Procedure Laterality Date  . ANKLE ARTHROSCOPY Left 04/2017  . COLONOSCOPY WITH PROPOFOL N/A 01/07/2019   Procedure: COLONOSCOPY WITH PROPOFOL;  Surgeon: Lin Landsman, MD;  Location: [place];  Service: Endoscopy;  Laterality: N/A;  . ESOPHAGOGASTRODUODENOSCOPY (EGD) WITH PROPOFOL N/A 01/07/2019   Procedure: ESOPHAGOGASTRODUODENOSCOPY (EGD) WITH BIOPSIES;  Surgeon: Lin Landsman, MD;  Location: [place];  Service: Endoscopy;  Laterality: N/A;  . MENISCUS REPAIR  2002   left     Medications:  Current Outpatient Medications on File Prior to Visit  Medication Sig  . Omega-3 Fatty Acids (FISH OIL) 1000 MG CAPS Take by mouth.   No current facility-administered medications on file prior to visit.    Allergies:  No Known Allergies  Social History:  Social History   Socioeconomic History  . Marital status: Single  Spouse name: Not on file  . Number of children: Not on file  . Years of education: Not on file  . Highest education level: Not on file  Occupational History  . Not on file  Tobacco Use  . Smoking status: Former Research scientist (life sciences)  . Smokeless tobacco: Former Systems developer    Types: Chew    Quit date: 2010  . Tobacco comment: smoked socially in college  Vaping Use  . Vaping Use: Never used  Substance and Sexual Activity  . Alcohol use: No    Comment: pt states he has not drank in the last 5 months   . 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 Sexual activity: Not Currently  Other Topics Concern  . Not on file  Social History Narrative  . Not on file    Social Determinants of Health   Financial Resource Strain:   . Difficulty of Paying Living Expenses: Not on file  Food Insecurity:   . Worried About Charity fundraiser in the Last Year: Not on file  . Ran Out of Food in the Last Year: Not on file  Transportation Needs:   . Lack of Transportation (Medical): Not on file  . Lack of Transportation (Non-Medical): Not on file  Physical Activity:   . Days of Exercise per Week: Not on file  . Minutes of Exercise per Session: Not on file  Stress:   . Feeling of Stress : Not on file  Social Connections:   . Frequency of Communication with Friends and Family: Not on file  . Frequency of Social Gatherings with Friends and Family: Not on file  . Attends Religious Services: Not on file  . Active Member of Clubs or Organizations: Not on file  . Attends Archivist Meetings: Not on file  . Marital Status: Not on file  Intimate Partner Violence:   . Fear of Current or Ex-Partner: Not on file  . Emotionally Abused: Not on file  . Physically Abused: Not on file  . Sexually Abused: Not on file   Social History   Tobacco Use  Smoking Status Former Smoker  Smokeless Tobacco Former Systems developer  . Types: Chew  . Quit date: 2010  Tobacco Comment   smoked socially in college   Social History   Substance and Sexual Activity  Alcohol Use No   Comment: pt states he has not drank in the last 5 months     Family History:  Family History  Problem Relation Age of Onset  . Cancer Mother        skin  . Alzheimer's disease Maternal Grandfather     Past medical history, surgical history, medications, allergies, family history and social history reviewed with patient today and changes made to appropriate areas of the chart.   Review of Systems - negative All other ROS negative except what is listed above and in the HPI.      Objective:    BP 127/86 (BP Location: Left Arm, Patient Position: Sitting, Cuff Size: Normal)   Pulse 63   Temp  97.6 F (36.4 C) (Oral)   Resp 16   Ht 5\' 10"  (1.778 m)   Wt 208 lb 6.4 oz (94.5 kg)   SpO2 96%   BMI 29.90 kg/m   Wt Readings from Last 3 Encounters:  08/16/20 208 lb 6.4 oz (94.5 kg)  02/08/20 202 lb 8 oz (91.9  kg)  12/31/19 206 lb (93.4 kg)    Physical Exam Vitals and nursing note reviewed.  Constitutional:      General: He is awake. He is not in acute distress.    Appearance: He is well-developed. He is not ill-appearing.  HENT:     Head: Normocephalic and atraumatic.     Right Ear: Hearing, tympanic membrane, ear canal and external ear normal. No drainage.     Left Ear: Hearing, tympanic membrane, ear canal and external ear normal. No drainage.     Nose: Nose normal.     Mouth/Throat:     Mouth: Mucous membranes are moist.     Pharynx: Uvula midline.  Eyes:     General: Lids are normal.        Right eye: No discharge.        Left eye: No discharge.     Extraocular Movements: Extraocular movements intact.     Conjunctiva/sclera: Conjunctivae normal.     Pupils: Pupils are equal, round, and reactive to light.     Visual Fields: Right eye visual fields normal and left eye visual fields normal.  Neck:     Thyroid: No thyromegaly.     Vascular: No carotid bruit.  Cardiovascular:     Rate and Rhythm: Normal rate and regular rhythm.     Heart sounds: Normal heart sounds, S1 normal and S2 normal. No murmur heard.  No gallop.   Pulmonary:     Effort: Pulmonary effort is normal. No accessory muscle usage or respiratory distress.     Breath sounds: Normal breath sounds.  Abdominal:     General: Bowel sounds are normal.     Palpations: Abdomen is soft. There is no hepatomegaly or splenomegaly.     Tenderness: There is no abdominal tenderness.  Genitourinary:    Comments: Deferred per patient request today. Musculoskeletal:        General: Normal range of motion.     Cervical back: Normal range of motion and neck supple.     Right lower leg: No edema.     Left lower leg:  No edema.  Lymphadenopathy:     Head:     Right side of head: No submental, submandibular, tonsillar, preauricular or posterior auricular adenopathy.     Left side of head: No submental, submandibular, tonsillar, preauricular or posterior auricular adenopathy.     Cervical: No cervical adenopathy.  Skin:    General: Skin is warm and dry.     Capillary Refill: Capillary refill takes less than 2 seconds.     Findings: No rash.  Neurological:     Mental Status: He is alert and oriented to person, place, and time.     Cranial Nerves: Cranial nerves are intact.     Gait: Gait is intact.     Deep Tendon Reflexes: Reflexes are normal and symmetric.  Psychiatric:        Mood and Affect: Mood normal.        Behavior: Behavior normal. Behavior is cooperative.        Thought Content: Thought content normal.        Judgment: Judgment normal.    Results for orders placed or performed in visit on 01/11/20  Iron, TIBC and Ferritin Panel  Result Value Ref Range   Total Iron Binding Capacity 303 250 - 450 ug/dL   UIBC 204 111 - 343 ug/dL   Iron 99 38 - 169 ug/dL   Iron Saturation 33 15 -  55 %   Ferritin 142 30.0 - 400.0 ng/mL  CBC with Differential/Platelet  Result Value Ref Range   WBC 6.3 3.4 - 10.8 x10E3/uL   RBC 5.13 4.14 - 5.80 x10E6/uL   Hemoglobin 16.2 13.0 - 17.7 g/dL   Hematocrit 47.5 37.5 - 51.0 %   MCV 93 79 - 97 fL   MCH 31.6 26.6 - 33.0 pg   MCHC 34.1 31 - 35 g/dL   RDW 12.5 11.6 - 15.4 %   Platelets 262 150 - 450 x10E3/uL   Neutrophils 62 Not Estab. %   Lymphs 31 Not Estab. %   Monocytes 5 Not Estab. %   Eos 2 Not Estab. %   Basos 0 Not Estab. %   Neutrophils Absolute 3.9 1.40 - 7.00 x10E3/uL   Lymphocytes Absolute 2.0 0 - 3 x10E3/uL   Monocytes Absolute 0.3 0 - 0 x10E3/uL   EOS (ABSOLUTE) 0.1 0.0 - 0.4 x10E3/uL   Basophils Absolute 0.0 0 - 0 x10E3/uL   Immature Granulocytes 0 Not Estab. %   Immature Grans (Abs) 0.0 0.0 - 0.1 x10E3/uL  Comprehensive metabolic panel    Result Value Ref Range   Glucose 81 65 - 99 mg/dL   BUN 21 6 - 24 mg/dL   Creatinine, Ser 1.04 0.76 - 1.27 mg/dL   GFR calc non Af Amer 89 >59 mL/min/1.73   GFR calc Af Amer 103 >59 mL/min/1.73   BUN/Creatinine Ratio 20 9 - 20   Sodium 140 134 - 144 mmol/L   Potassium 4.7 3.5 - 5.2 mmol/L   Chloride 101 96 - 106 mmol/L   CO2 22 20 - 29 mmol/L   Calcium 9.8 8.7 - 10.2 mg/dL   Total Protein 7.4 6.0 - 8.5 g/dL   Albumin 5.1 (H) 4.0 - 5.0 g/dL   Globulin, Total 2.3 1.5 - 4.5 g/dL   Albumin/Globulin Ratio 2.2 1.2 - 2.2   Bilirubin Total 0.6 0.0 - 1.2 mg/dL   Alkaline Phosphatase 77 39 - 117 IU/L   AST 16 0 - 40 IU/L   ALT 18 0 - 44 IU/L  H Pylori, IGM, IGG, IGA AB  Result Value Ref Range   H. pylori, IgG AbS 4.56 (H) 0.00 - 0.79 Index Value   H. pylori, IgA Abs 16.0 (H) 0.0 - 8.9 units   H pylori, IgM Abs <9.0 0.0 - 8.9 units      Assessment & Plan:   Problem List Items Addressed This Visit    None    Visit Diagnoses    Encounter for annual physical exam    -  Primary   Annual physical today with labs CBC, CMP, TSH, Lipid   Relevant Orders   CBC with Differential/Platelet   Comprehensive metabolic panel   Lipid Panel w/o Chol/HDL Ratio   TSH   Fatigue, unspecified type       Will check testosterone today per request and review once returned.   Relevant Orders   Testosterone, free, total([facility])   Lipid screening       Cholesterol screening on labs today   Relevant Orders   Lipid Panel w/o Chol/HDL Ratio   Thyroid disorder screen       Thyroid labs today   Relevant Orders   TSH   Need for hepatitis C screening test       Hep C screening on labs today.   Relevant Orders   Hepatitis C antibody       Discussed aspirin prophylaxis for myocardial  infarction prevention and decision was it was not indicated  LABORATORY TESTING:  Health maintenance labs ordered today as discussed above.   The natural history of prostate cancer and ongoing controversy  regarding screening and potential treatment outcomes of prostate cancer has been discussed with the patient. The meaning of a false positive PSA and a false negative PSA has been discussed. He indicates understanding of the limitations of this screening test and wishes to proceed with screening PSA testing.   IMMUNIZATIONS:   - Tdap: Tetanus vaccination status reviewed: last tetanus booster within 10 years. - Influenza: Refuses -- gets sick with this - Pneumovax: Not applicable - Prevnar: Not applicable - Zostavax vaccine: Not applicable  SCREENING: - Colonoscopy: Up to date  Discussed with patient purpose of the colonoscopy is to detect colon cancer at curable precancerous or early stages   - AAA Screening: Not applicable  -Hearing Test: Not applicable  -Spirometry: Not applicable   PATIENT COUNSELING:    Sexuality: Discussed sexually transmitted diseases, partner selection, use of condoms, avoidance of unintended pregnancy  and contraceptive alternatives.   Advised to avoid cigarette smoking.  I discussed with the patient that most people either abstain from alcohol or drink within safe limits (<=14/week and <=4 drinks/occasion for males, <=7/weeks and <= 3 drinks/occasion for females) and that the risk for alcohol disorders and other health effects rises proportionally with the number of drinks per week and how often a drinker exceeds daily limits.  Discussed cessation/primary prevention of drug use and availability of treatment for abuse.   Diet: Encouraged to adjust caloric intake to maintain  or achieve ideal body weight, to reduce intake of dietary saturated fat and total fat, to limit sodium intake by avoiding high sodium foods and not adding table salt, and to maintain adequate dietary potassium and calcium preferably from fresh fruits, vegetables, and low-fat dairy products.    Stressed the importance of regular exercise  Injury prevention: Discussed safety belts, safety  helmets, smoke detector, smoking near bedding or upholstery.   Dental health: Discussed importance of regular tooth brushing, flossing, and dental visits.   Follow up plan: NEXT PREVENTATIVE PHYSICAL DUE IN 1 YEAR. Return in about 1 year (around 08/16/2021) for Annual physical.

## 2020-08-16 NOTE — Patient Instructions (Signed)
Healthy Eating Following a healthy eating pattern may help you to achieve and maintain a healthy body weight, reduce the risk of chronic disease, and live a long and productive life. It is important to follow a healthy eating pattern at an appropriate calorie level for your body. Your nutritional needs should be met primarily through food by choosing a variety of nutrient-rich foods. What are tips for following this plan? Reading food labels  Read labels and choose the following: ? Reduced or low sodium. ? Juices with 100% fruit juice. ? Foods with low saturated fats and high polyunsaturated and monounsaturated fats. ? Foods with whole grains, such as whole wheat, cracked wheat, brown rice, and wild rice. ? Whole grains that are fortified with folic acid. This is recommended for women who are pregnant or who want to become pregnant.  Read labels and avoid the following: ? Foods with a lot of added sugars. These include foods that contain brown sugar, corn sweetener, corn syrup, dextrose, fructose, glucose, high-fructose corn syrup, honey, invert sugar, lactose, malt syrup, maltose, molasses, raw sugar, sucrose, trehalose, or turbinado sugar.  Do not eat more than the following amounts of added sugar per day:  6 teaspoons (25 g) for women.  9 teaspoons (38 g) for men. ? Foods that contain processed or refined starches and grains. ? Refined grain products, such as white flour, degermed cornmeal, white bread, and white rice. Shopping  Choose nutrient-rich snacks, such as vegetables, whole fruits, and nuts. Avoid high-calorie and high-sugar snacks, such as potato chips, fruit snacks, and candy.  Use oil-based dressings and spreads on foods instead of solid fats such as butter, stick margarine, or cream cheese.  Limit pre-made sauces, mixes, and "instant" products such as flavored rice, instant noodles, and ready-made pasta.  Try more plant-protein sources, such as tofu, tempeh, black beans,  edamame, lentils, nuts, and seeds.  Explore eating plans such as the Mediterranean diet or vegetarian diet. Cooking  Use oil to saut or stir-fry foods instead of solid fats such as butter, stick margarine, or lard.  Try baking, boiling, grilling, or broiling instead of frying.  Remove the fatty part of meats before cooking.  Steam vegetables in water or broth. Meal planning   At meals, imagine dividing your plate into fourths: ? One-half of your plate is fruits and vegetables. ? One-fourth of your plate is whole grains. ? One-fourth of your plate is protein, especially lean meats, poultry, eggs, tofu, beans, or nuts.  Include low-fat dairy as part of your daily diet. Lifestyle  Choose healthy options in all settings, including home, work, school, restaurants, or stores.  Prepare your food safely: ? Wash your hands after handling raw meats. ? Keep food preparation surfaces clean by regularly washing with hot, soapy water. ? Keep raw meats separate from ready-to-eat foods, such as fruits and vegetables. ? Cook seafood, meat, poultry, and eggs to the recommended internal temperature. ? Store foods at safe temperatures. In general:  Keep cold foods at 59F (4.4C) or below.  Keep hot foods at 159F (60C) or above.  Keep your freezer at [facility] (-17.8C) or below.  Foods are no longer safe to eat when they have been between the temperatures of 40-159F (4.4-60C) for more than 2 hours. What foods should I eat? Fruits Aim to eat 2 cup-equivalents of fresh, canned (in natural juice), or frozen fruits each day. Examples of 1 cup-equivalent of fruit include 1 small apple, 8 large strawberries, 1 cup canned fruit,  cup  dried fruit, or 1 cup 100% juice. Vegetables Aim to eat 2-3 cup-equivalents of fresh and frozen vegetables each day, including different varieties and colors. Examples of 1 cup-equivalent of vegetables include 2 medium carrots, 2 cups raw, leafy greens, 1 cup chopped  vegetable (raw or cooked), or 1 medium baked potato. Grains Aim to eat 6 ounce-equivalents of whole grains each day. Examples of 1 ounce-equivalent of grains include 1 slice of bread, 1 cup ready-to-eat cereal, 3 cups popcorn, or  cup cooked rice, pasta, or cereal. Meats and other proteins Aim to eat 5-6 ounce-equivalents of protein each day. Examples of 1 ounce-equivalent of protein include 1 egg, 1/2 cup nuts or seeds, or 1 tablespoon (16 g) peanut butter. A cut of meat or fish that is the size of a deck of cards is about 3-4 ounce-equivalents.  Of the protein you eat each week, try to have at least 8 ounces come from seafood. This includes salmon, trout, herring, and anchovies. Dairy Aim to eat 3 cup-equivalents of fat-free or low-fat dairy each day. Examples of 1 cup-equivalent of dairy include 1 cup (240 mL) milk, 8 ounces (250 g) yogurt, 1 ounces (44 g) natural cheese, or 1 cup (240 mL) fortified soy milk. Fats and oils  Aim for about 5 teaspoons (21 g) per day. Choose monounsaturated fats, such as canola and olive oils, avocados, peanut butter, and most nuts, or polyunsaturated fats, such as sunflower, corn, and soybean oils, walnuts, pine nuts, sesame seeds, sunflower seeds, and flaxseed. Beverages  Aim for six 8-oz glasses of water per day. Limit coffee to three to five 8-oz cups per day.  Limit caffeinated beverages that have added calories, such as soda and energy drinks.  Limit alcohol intake to no more than 1 drink a day for nonpregnant women and 2 drinks a day for men. One drink equals 12 oz of beer (355 mL), 5 oz of wine (148 mL), or 1 oz of hard liquor (44 mL). Seasoning and other foods  Avoid adding excess amounts of salt to your foods. Try flavoring foods with herbs and spices instead of salt.  Avoid adding sugar to foods.  Try using oil-based dressings, sauces, and spreads instead of solid fats. This information is based on general [place] nutrition guidelines. For more  information, visit [employer]. Exact amounts may vary based on your nutrition needs. Summary  A healthy eating plan may help you to maintain a healthy weight, reduce the risk of chronic diseases, and stay active throughout your life.  Plan your meals. Make sure you eat the right portions of a variety of nutrient-rich foods.  Try baking, boiling, grilling, or broiling instead of frying.  Choose healthy options in all settings, including home, work, school, restaurants, or stores. This information is not intended to replace advice given to you by your health care provider. Make sure you discuss any questions you have with your health care provider. Document Revised: 01/20/2018 Document Reviewed: 01/20/2018 Elsevier Patient Education  [place].

## 2020-08-17 ENCOUNTER — Encounter: Payer: Self-pay | Admitting: Nurse Practitioner

## 2020-08-17 DIAGNOSIS — R7309 Other abnormal glucose: Secondary | ICD-10-CM | POA: Insufficient documentation

## 2020-08-17 DIAGNOSIS — R7401 Elevation of levels of liver transaminase levels: Secondary | ICD-10-CM | POA: Insufficient documentation

## 2020-08-17 LAB — CBC WITH DIFFERENTIAL/PLATELET
Basophils Absolute: 0 10*3/uL (ref 0.0–0.2)
Basos: 1 %
EOS (ABSOLUTE): 0.2 10*3/uL (ref 0.0–0.4)
Eos: 2 %
Hematocrit: 45.4 % (ref 37.5–51.0)
Hemoglobin: 15.7 g/dL (ref 13.0–17.7)
Immature Grans (Abs): 0 10*3/uL (ref 0.0–0.1)
Immature Granulocytes: 0 %
Lymphocytes Absolute: 1.6 10*3/uL (ref 0.7–3.1)
Lymphs: 23 %
MCH: 32.8 pg (ref 26.6–33.0)
MCHC: 34.6 g/dL (ref 31.5–35.7)
MCV: 95 fL (ref 79–97)
Monocytes Absolute: 0.5 10*3/uL (ref 0.1–0.9)
Monocytes: 7 %
Neutrophils Absolute: 4.6 10*3/uL (ref 1.4–7.0)
Neutrophils: 67 %
Platelets: 269 10*3/uL (ref 150–450)
RBC: 4.78 x10E6/uL (ref 4.14–5.80)
RDW: 12.7 % (ref 11.6–15.4)
WBC: 6.9 10*3/uL (ref 3.4–10.8)

## 2020-08-17 LAB — COMPREHENSIVE METABOLIC PANEL
ALT: 59 IU/L — ABNORMAL HIGH (ref 0–44)
AST: 27 IU/L (ref 0–40)
Albumin/Globulin Ratio: 2.1 (ref 1.2–2.2)
Albumin: 4.5 g/dL (ref 4.0–5.0)
Alkaline Phosphatase: 75 IU/L (ref 44–121)
BUN/Creatinine Ratio: 15 (ref 9–20)
BUN: 16 mg/dL (ref 6–24)
Bilirubin Total: 0.2 mg/dL (ref 0.0–1.2)
CO2: 22 mmol/L (ref 20–29)
Calcium: 9.3 mg/dL (ref 8.7–10.2)
Chloride: 105 mmol/L (ref 96–106)
Creatinine, Ser: 1.09 mg/dL (ref 0.76–1.27)
GFR calc Af Amer: 97 mL/min/{1.73_m2} (ref >59)
GFR calc non Af Amer: 84 mL/min/{1.73_m2} (ref >59)
Globulin, Total: 2.1 g/dL (ref 1.5–4.5)
Glucose: 120 mg/dL — ABNORMAL HIGH (ref 65–99)
Potassium: 4.5 mmol/L (ref 3.5–5.2)
Sodium: 142 mmol/L (ref 134–144)
Total Protein: 6.6 g/dL (ref 6.0–8.5)

## 2020-08-17 LAB — TESTOSTERONE, FREE, TOTAL, SHBG
Sex Hormone Binding: 32.3 nmol/L (ref 16.5–55.9)
Testosterone, Free: 13.6 pg/mL (ref 6.8–21.5)
Testosterone: 274 ng/dL (ref 264–916)

## 2020-08-17 LAB — HEPATITIS C ANTIBODY: Hep C Virus Ab: <0.1 s/co ratio (ref 0.0–0.9)

## 2020-08-17 LAB — LIPID PANEL W/O CHOL/HDL RATIO
Cholesterol, Total: 201 mg/dL — ABNORMAL HIGH (ref 100–199)
HDL: 32 mg/dL — ABNORMAL LOW (ref >39)
LDL Chol Calc (NIH): 91 mg/dL (ref 0–99)
Triglycerides: 469 mg/dL — ABNORMAL HIGH (ref 0–149)
VLDL Cholesterol Cal: 78 mg/dL — ABNORMAL HIGH (ref 5–40)

## 2020-08-17 LAB — TSH: TSH: 1.56 u[IU]/mL (ref 0.450–4.500)

## 2020-08-17 NOTE — Progress Notes (Signed)
Contacted via MyChart  Good evening Eric Kline, your labs have returned: - CBC, kidney function, and thyroid normal.  Hep C is negative. - Testosterone levels are normal, but on low end of normal.  We can continue to monitor this. - Glucose (sugar) level is elevated this check, I would like to recheck this on your next labs and check A1C as well to ensure no prediabetes or diabetes.  Were you fasting? - One of your liver tests, ALT, is mildly elevated. This is new for you.  Have you been drinking any alcohol or taking Tylenol often?  If so cut back on these and then will recheck this on next labs. - Cholesterol levels elevated.  I recommend you heavily focus on diet, reducing saturated fats and fatty meats, and regular exercise.  Will recheck next physical.  Any questions? Keep being awesome!!  Thank you for allowing me to participate in your care. Kindest regards, Daegen Berrocal

## 2020-11-10 DIAGNOSIS — S8392XA Sprain of unspecified site of left knee, initial encounter: Secondary | ICD-10-CM | POA: Diagnosis not present

## 2020-11-15 DIAGNOSIS — F1021 Alcohol dependence, in remission: Secondary | ICD-10-CM | POA: Diagnosis not present

## 2020-11-15 DIAGNOSIS — F419 Anxiety disorder, unspecified: Secondary | ICD-10-CM | POA: Diagnosis not present

## 2020-11-15 DIAGNOSIS — F329 Major depressive disorder, single episode, unspecified: Secondary | ICD-10-CM | POA: Diagnosis not present

## 2020-11-21 DIAGNOSIS — M25672 Stiffness of left ankle, not elsewhere classified: Secondary | ICD-10-CM | POA: Diagnosis not present

## 2020-11-21 DIAGNOSIS — M25562 Pain in left knee: Secondary | ICD-10-CM | POA: Diagnosis not present

## 2020-11-21 DIAGNOSIS — M25652 Stiffness of left hip, not elsewhere classified: Secondary | ICD-10-CM | POA: Diagnosis not present

## 2020-11-28 DIAGNOSIS — M25652 Stiffness of left hip, not elsewhere classified: Secondary | ICD-10-CM | POA: Diagnosis not present

## 2020-11-28 DIAGNOSIS — M25672 Stiffness of left ankle, not elsewhere classified: Secondary | ICD-10-CM | POA: Diagnosis not present

## 2020-11-28 DIAGNOSIS — M25562 Pain in left knee: Secondary | ICD-10-CM | POA: Diagnosis not present

## 2020-12-01 DIAGNOSIS — F329 Major depressive disorder, single episode, unspecified: Secondary | ICD-10-CM | POA: Diagnosis not present

## 2020-12-01 DIAGNOSIS — F1021 Alcohol dependence, in remission: Secondary | ICD-10-CM | POA: Diagnosis not present

## 2020-12-01 DIAGNOSIS — F419 Anxiety disorder, unspecified: Secondary | ICD-10-CM | POA: Diagnosis not present

## 2020-12-08 DIAGNOSIS — M25672 Stiffness of left ankle, not elsewhere classified: Secondary | ICD-10-CM | POA: Diagnosis not present

## 2020-12-08 DIAGNOSIS — M25652 Stiffness of left hip, not elsewhere classified: Secondary | ICD-10-CM | POA: Diagnosis not present

## 2020-12-08 DIAGNOSIS — M25562 Pain in left knee: Secondary | ICD-10-CM | POA: Diagnosis not present

## 2020-12-08 DIAGNOSIS — M7522 Bicipital tendinitis, left shoulder: Secondary | ICD-10-CM | POA: Diagnosis not present

## 2020-12-12 ENCOUNTER — Ambulatory Visit: Payer: Federal, State, Local not specified - PPO | Admitting: Dermatology

## 2020-12-12 ENCOUNTER — Encounter: Payer: Self-pay | Admitting: Dermatology

## 2020-12-12 ENCOUNTER — Other Ambulatory Visit: Payer: Self-pay

## 2020-12-12 DIAGNOSIS — D2272 Melanocytic nevi of left lower limb, including hip: Secondary | ICD-10-CM | POA: Diagnosis not present

## 2020-12-12 DIAGNOSIS — L814 Other melanin hyperpigmentation: Secondary | ICD-10-CM

## 2020-12-12 DIAGNOSIS — L819 Disorder of pigmentation, unspecified: Secondary | ICD-10-CM

## 2020-12-12 DIAGNOSIS — D229 Melanocytic nevi, unspecified: Secondary | ICD-10-CM

## 2020-12-12 DIAGNOSIS — L719 Rosacea, unspecified: Secondary | ICD-10-CM | POA: Diagnosis not present

## 2020-12-12 DIAGNOSIS — D485 Neoplasm of uncertain behavior of skin: Secondary | ICD-10-CM

## 2020-12-12 DIAGNOSIS — M25652 Stiffness of left hip, not elsewhere classified: Secondary | ICD-10-CM | POA: Diagnosis not present

## 2020-12-12 DIAGNOSIS — Z86018 Personal history of other benign neoplasm: Secondary | ICD-10-CM | POA: Diagnosis not present

## 2020-12-12 DIAGNOSIS — L821 Other seborrheic keratosis: Secondary | ICD-10-CM

## 2020-12-12 DIAGNOSIS — Z8619 Personal history of other infectious and parasitic diseases: Secondary | ICD-10-CM

## 2020-12-12 DIAGNOSIS — D1800 Hemangioma unspecified site: Secondary | ICD-10-CM

## 2020-12-12 DIAGNOSIS — Z1283 Encounter for screening for malignant neoplasm of skin: Secondary | ICD-10-CM

## 2020-12-12 DIAGNOSIS — M25562 Pain in left knee: Secondary | ICD-10-CM | POA: Diagnosis not present

## 2020-12-12 DIAGNOSIS — M25672 Stiffness of left ankle, not elsewhere classified: Secondary | ICD-10-CM | POA: Diagnosis not present

## 2020-12-12 DIAGNOSIS — L578 Other skin changes due to chronic exposure to nonionizing radiation: Secondary | ICD-10-CM

## 2020-12-12 HISTORY — DX: Melanocytic nevi, unspecified: D22.9

## 2020-12-12 MED ORDER — DOXYCYCLINE 40 MG PO CPDR: DELAYED_RELEASE_CAPSULE | ORAL | 5 refills | Status: DC

## 2020-12-12 NOTE — Progress Notes (Signed)
Follow-Up Visit   Subjective  Eric Kline is a 40 y.o. male who presents for the following: Annual Exam (Hx dysplastic nevi ). The patient presents for Total-Body Skin Exam (TBSE) for skin cancer screening and mole check. Patient hasn't noticed any worsening of the hypopigmentation of his groin, but he also hasn't noticed any improvement. He is currently using Protopic 0.1% ointment QOD and vitamins.  The patient presents for Total-Body Skin Exam (TBSE) for skin cancer screening and mole check.  The following portions of the chart were reviewed this encounter and updated as appropriate:   Tobacco  Allergies  Meds  Problems  Med Hx  Surg Hx  Fam Hx     Review of Systems:  No other skin or systemic complaints except as noted in HPI or Assessment and Plan.  Objective  Well appearing patient in no apparent distress; mood and affect are within normal limits.  A full examination was performed including scalp, head, eyes, ears, nose, lips, neck, chest, axillae, abdomen, back, buttocks, bilateral upper extremities, bilateral lower extremities, hands, feet, fingers, toes, fingernails, and toenails. All findings within normal limits unless otherwise noted below.  Objective  Face and eyes: Erythema of the face and eyes  Objective  L mid ant thigh: 0.7 cm irregular brown macule   Objective  Groin: Hypopigmentation  Objective  Groin: Clear today    Assessment & Plan  Rosacea Face and eyes With ocular component - follow up with ophthalmologist, recommend oral Doxycycline  Rosacea is a chronic progressive skin condition usually affecting the face of adults, causing redness and/or acne bumps. It is treatable but not curable. It sometimes affects the eyes (ocular rosacea) as well. It may respond to topical and/or systemic medication and can flare with stress, sun exposure, alcohol, exercise and some foods.  Daily application of broad spectrum spf 30+ sunscreen to face is recommended to  reduce flares.  Start Doxycycline (Oracea) 40mg  po QD or  Doxycycline 20mg  po BID if 40mg  not covered by insurance.  Doxycycline should be taken with food to prevent nausea. Do not lay down for 30 minutes after taking. Be cautious with sun exposure and use good sun protection while on this medication. Pregnant women should not take this medication.   doxycycline (ORACEA) 40 MG capsule - Face and eyes  Neoplasm of uncertain behavior of skin L mid ant thigh Epidermal / dermal shaving  Lesion diameter (cm):  0.7 Informed consent: discussed and consent obtained   Timeout: patient name, date of birth, surgical site, and procedure verified   Procedure prep:  Patient was prepped and draped in usual sterile fashion Prep type:  Isopropyl alcohol Anesthesia: the lesion was anesthetized in a standard fashion   Anesthetic:  1% lidocaine w/ epinephrine 1-100,000 buffered w/ 8.4% NaHCO3 Instrument used: flexible razor blade   Hemostasis achieved with: pressure, aluminum chloride and electrodesiccation   Outcome: patient tolerated procedure well   Post-procedure details: sterile dressing applied and wound care instructions given   Dressing type: bandage and petrolatum    Specimen 1 - Surgical pathology Differential Diagnosis: D48.5 r/o dysplastic nevus  Check Margins: No 0.7 cm irregular brown macule   Depigmentation of skin Groin; genital Possibly from imiquimod use post-inflammatory changes?  vs Vitiligo No other areas involved on exam today Chronic; persistent Discussed with patient that he may or may not see re-pigmentation at areas if entirely due to Imiquimod, but if vitiligo areas may worsen and new areas may occur.  Vitiligo does have  some potential to improve on its own without treatment.  Do not recommend biopsy since it may not give a definitive diagnosis and treatment recommendations would not necessarily change for this location. May continue Protopic and A, C, E antioxidant  vitamins.  History of condyloma acuminatum Groin Clear today.  Discussed potential for recurrence.  Observe. RTC PRN recurrence   Skin cancer screening  Lentigines - Scattered tan macules - Discussed due to sun exposure - Benign, observe - Call for any changes  Seborrheic Keratoses - Stuck-on, waxy, tan-brown papules and plaques  - Discussed benign etiology and prognosis. - Observe - Call for any changes  Melanocytic Nevi - Tan-brown and/or pink-flesh-colored symmetric macules and papules - Benign appearing on exam today - Observation - Call clinic for new or changing moles - Recommend daily use of broad spectrum spf 30+ sunscreen to sun-exposed areas.   Hemangiomas - Red papules - Discussed benign nature - Observe - Call for any changes  Actinic Damage - Chronic, secondary to cumulative UV/sun exposure - diffuse scaly erythematous macules with underlying dyspigmentation - Recommend daily broad spectrum sunscreen SPF 30+ to sun-exposed areas, reapply every 2 hours as needed.  - Call for new or changing lesions.  History of Dysplastic Nevi - No evidence of recurrence today - Recommend regular full body skin exams - Recommend daily broad spectrum sunscreen SPF 30+ to sun-exposed areas, reapply every 2 hours as needed.  - Call if any new or changing lesions are noted between office visits  Skin cancer screening performed today.  Return in about 6 months (around 06/11/2021) for TBSE - rosacea follow up .  [name], CMA, am acting as scribe for Sarina Ser, MD .  Documentation: I have reviewed the above documentation for accuracy and completeness, and I agree with the above.  Sarina Ser, MD

## 2020-12-12 NOTE — Patient Instructions (Signed)

## 2020-12-13 ENCOUNTER — Encounter: Payer: Self-pay | Admitting: Dermatology

## 2020-12-19 DIAGNOSIS — M25512 Pain in left shoulder: Secondary | ICD-10-CM | POA: Diagnosis not present

## 2020-12-20 ENCOUNTER — Telehealth: Payer: Self-pay

## 2020-12-20 NOTE — Telephone Encounter (Signed)
Discussed biopsy results with pt  °

## 2020-12-20 NOTE — Telephone Encounter (Signed)
-----   Message from Ralene Bathe, MD sent at 12/15/2020  5:39 PM EST ----- Diagnosis Skin , left mid ant thigh [place], CLOSE TO MARGIN  Dysplastic Moderate Recheck next visit

## 2020-12-26 DIAGNOSIS — M25512 Pain in left shoulder: Secondary | ICD-10-CM | POA: Diagnosis not present

## 2020-12-26 DIAGNOSIS — M25672 Stiffness of left ankle, not elsewhere classified: Secondary | ICD-10-CM | POA: Diagnosis not present

## 2020-12-26 DIAGNOSIS — M25652 Stiffness of left hip, not elsewhere classified: Secondary | ICD-10-CM | POA: Diagnosis not present

## 2020-12-26 DIAGNOSIS — M25562 Pain in left knee: Secondary | ICD-10-CM | POA: Diagnosis not present

## 2020-12-29 DIAGNOSIS — F329 Major depressive disorder, single episode, unspecified: Secondary | ICD-10-CM | POA: Diagnosis not present

## 2020-12-29 DIAGNOSIS — F1021 Alcohol dependence, in remission: Secondary | ICD-10-CM | POA: Diagnosis not present

## 2020-12-29 DIAGNOSIS — F419 Anxiety disorder, unspecified: Secondary | ICD-10-CM | POA: Diagnosis not present

## 2021-01-02 DIAGNOSIS — M25672 Stiffness of left ankle, not elsewhere classified: Secondary | ICD-10-CM | POA: Diagnosis not present

## 2021-01-02 DIAGNOSIS — M25652 Stiffness of left hip, not elsewhere classified: Secondary | ICD-10-CM | POA: Diagnosis not present

## 2021-01-02 DIAGNOSIS — M25512 Pain in left shoulder: Secondary | ICD-10-CM | POA: Diagnosis not present

## 2021-01-02 DIAGNOSIS — M25562 Pain in left knee: Secondary | ICD-10-CM | POA: Diagnosis not present

## 2021-01-03 DIAGNOSIS — M25562 Pain in left knee: Secondary | ICD-10-CM | POA: Diagnosis not present

## 2021-01-04 DIAGNOSIS — M7522 Bicipital tendinitis, left shoulder: Secondary | ICD-10-CM | POA: Diagnosis not present

## 2021-01-04 DIAGNOSIS — M25562 Pain in left knee: Secondary | ICD-10-CM | POA: Diagnosis not present

## 2021-01-09 DIAGNOSIS — M25652 Stiffness of left hip, not elsewhere classified: Secondary | ICD-10-CM | POA: Diagnosis not present

## 2021-01-09 DIAGNOSIS — M25672 Stiffness of left ankle, not elsewhere classified: Secondary | ICD-10-CM | POA: Diagnosis not present

## 2021-01-09 DIAGNOSIS — M25562 Pain in left knee: Secondary | ICD-10-CM | POA: Diagnosis not present

## 2021-01-09 DIAGNOSIS — M25512 Pain in left shoulder: Secondary | ICD-10-CM | POA: Diagnosis not present

## 2021-01-16 DIAGNOSIS — M25652 Stiffness of left hip, not elsewhere classified: Secondary | ICD-10-CM | POA: Diagnosis not present

## 2021-01-16 DIAGNOSIS — M25562 Pain in left knee: Secondary | ICD-10-CM | POA: Diagnosis not present

## 2021-01-16 DIAGNOSIS — M25672 Stiffness of left ankle, not elsewhere classified: Secondary | ICD-10-CM | POA: Diagnosis not present

## 2021-01-16 DIAGNOSIS — M25512 Pain in left shoulder: Secondary | ICD-10-CM | POA: Diagnosis not present

## 2021-01-23 DIAGNOSIS — M25672 Stiffness of left ankle, not elsewhere classified: Secondary | ICD-10-CM | POA: Diagnosis not present

## 2021-01-23 DIAGNOSIS — M25652 Stiffness of left hip, not elsewhere classified: Secondary | ICD-10-CM | POA: Diagnosis not present

## 2021-01-23 DIAGNOSIS — M25562 Pain in left knee: Secondary | ICD-10-CM | POA: Diagnosis not present

## 2021-01-23 DIAGNOSIS — M25512 Pain in left shoulder: Secondary | ICD-10-CM | POA: Diagnosis not present

## 2021-01-30 DIAGNOSIS — M25512 Pain in left shoulder: Secondary | ICD-10-CM | POA: Diagnosis not present

## 2021-01-30 DIAGNOSIS — M25652 Stiffness of left hip, not elsewhere classified: Secondary | ICD-10-CM | POA: Diagnosis not present

## 2021-01-30 DIAGNOSIS — M25562 Pain in left knee: Secondary | ICD-10-CM | POA: Diagnosis not present

## 2021-01-30 DIAGNOSIS — M25672 Stiffness of left ankle, not elsewhere classified: Secondary | ICD-10-CM | POA: Diagnosis not present

## 2021-02-03 ENCOUNTER — Encounter: Payer: Self-pay | Admitting: Dermatology

## 2021-02-06 DIAGNOSIS — M25512 Pain in left shoulder: Secondary | ICD-10-CM | POA: Diagnosis not present

## 2021-02-06 DIAGNOSIS — M25672 Stiffness of left ankle, not elsewhere classified: Secondary | ICD-10-CM | POA: Diagnosis not present

## 2021-02-06 DIAGNOSIS — M25652 Stiffness of left hip, not elsewhere classified: Secondary | ICD-10-CM | POA: Diagnosis not present

## 2021-02-06 DIAGNOSIS — M25562 Pain in left knee: Secondary | ICD-10-CM | POA: Diagnosis not present

## 2021-02-07 MED ORDER — PODOFILOX 0.5 % EX SOLN: CUTANEOUS | 1 refills | Status: DC

## 2021-02-07 MED ORDER — TACROLIMUS 0.1 % EX OINT: TOPICAL_OINTMENT | Freq: Two times a day (BID) | CUTANEOUS | 3 refills | Status: DC

## 2021-02-20 ENCOUNTER — Other Ambulatory Visit: Payer: Self-pay

## 2021-02-20 ENCOUNTER — Ambulatory Visit: Payer: Federal, State, Local not specified - PPO | Admitting: Nurse Practitioner

## 2021-02-20 ENCOUNTER — Encounter: Payer: Self-pay | Admitting: Nurse Practitioner

## 2021-02-20 ENCOUNTER — Ambulatory Visit: Payer: Self-pay

## 2021-02-20 VITALS — BP 110/67 | HR 81 | Temp 98.7°F | Wt 201.2 lb

## 2021-02-20 DIAGNOSIS — L237 Allergic contact dermatitis due to plants, except food: Secondary | ICD-10-CM | POA: Diagnosis not present

## 2021-02-20 MED ORDER — TRIAMCINOLONE ACETONIDE 40 MG/ML IJ SUSP
40.0000 mg | Freq: Once | INTRAMUSCULAR | Status: AC
  Administered 2021-02-20: 40 mg via INTRAMUSCULAR

## 2021-02-20 MED ORDER — PREDNISONE 10 MG PO TABS: ORAL_TABLET | ORAL | 0 refills | Status: DC

## 2021-02-20 NOTE — Telephone Encounter (Signed)
Pt. Reports he started with a poison ivy rash last week. Getting worse. Now on his face and close to his eyes.Right eye has some swelling. Appointment made for today.  Reason for Disposition . SEVERE itching (e.g., interferes with sleep or normal activities)  Answer Assessment - Initial Assessment Questions 1. APPEARANCE of RASH: "Describe the rash."      Weeps, blisters 2. LOCATION: "Where is the rash located?"      Face,arms,neck 3. SIZE: "How large is the rash?"      Large 4. ONSET: "When did the rash begin?"      Last week 5. ITCHING: "Does the rash itch?" If Yes, ask: "How bad is it?"   - MILD - doesn't interfere with normal activities   - MODERATE-SEVERE: interferes with work, school, sleep, or other activities      Severe 6. PREGNANCY: "Is there any chance you are pregnant?" "When was your last menstrual period?"     n/a  Protocols used: POISON IVY - [place] - SUMAC-A-AH

## 2021-02-20 NOTE — Progress Notes (Signed)
Acute Office Visit  Subjective:    Patient ID: Eric Kline, male    DOB: 03/29/1981, 40 y.o.   MRN: [id number]  Chief Complaint  Patient presents with  . Rash    Pt states he thinks he may have poison ivy. States he has an itchy rash on his arms, neck and face. States he thinks he may have come in contact with the plant last Wednesday and the rash came up Friday     HPI  Patient is in today for poison ivy. He got exposed Wednesday at work and started noticing the rash on Friday. He tried to sanitize everything and get the oils off, however he is still noticing new areas.   RASH  Duration:  days  Location: trunk, face, hands, arms and legs  Itching: yes Burning: no Redness: yes Oozing: yes Scaling: no Blisters: yes Painful: no Fevers: no Change in detergents/soaps/personal care products: no Recent illness: no Recent travel:no History of same: yes Context: worse Alleviating factors: rubbing alcohol at first, calamine lotion, tecnu  Treatments attempted:rubbing alcohol first, calamine lotion, tencu Shortness of breath: no  Throat/tongue swelling: no Myalgias/arthralgias: no   Past Medical History:  Diagnosis Date  . Allergy   . Atypical mole   . Atypical mole 12/12/2020   L mid ant thigh, mod atypia   . Concussion    x3  . Iron deficiency anemia   . Wears contact lenses     Past Surgical History:  Procedure Laterality Date  . ANKLE ARTHROSCOPY Left 04/2017  . COLONOSCOPY WITH PROPOFOL N/A 01/07/2019   Procedure: COLONOSCOPY WITH PROPOFOL;  Surgeon: Lin Landsman, MD;  Location: [facility];  Service: Endoscopy;  Laterality: N/A;  . ESOPHAGOGASTRODUODENOSCOPY (EGD) WITH PROPOFOL N/A 01/07/2019   Procedure: ESOPHAGOGASTRODUODENOSCOPY (EGD) WITH BIOPSIES;  Surgeon: Lin Landsman, MD;  Location: [facility];  Service: Endoscopy;  Laterality: N/A;  . MENISCUS REPAIR  2002   left     Family History  Problem Relation Age of Onset  .  Cancer Mother        skin  . Alzheimer's disease Maternal Grandfather     Social History   Socioeconomic History  . Marital status: Single    Spouse name: Not on file  . Number of children: Not on file  . Years of education: Not on file  . Highest education level: Not on file  Occupational History  . Not on file  Tobacco Use  . Smoking status: Former Research scientist (life sciences)  . Smokeless tobacco: Former Systems developer    Types: Chew    Quit date: 2010  . Tobacco comment: smoked socially in college  Vaping Use  . Vaping Use: Never used  Substance and Sexual Activity  . Alcohol use: No    Comment: pt states he has not drank in the last 5 months   . 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 Sexual activity: Not Currently  Other Topics Concern  . Not on file  Social History Narrative  . Not on file   Social Determinants of Health   Financial Resource Strain: Not on file  Food Insecurity: Not on file  Transportation Needs: Not on file  Physical Activity: Not on file  Stress: Not on file  Social Connections: Not on file  Intimate Partner Violence: Not on file    Outpatient Medications Prior to Visit  Medication Sig Dispense  Refill  . podofilox (CONDYLOX) 0.5 % external solution Apply to aa's warts QD PRN. 3.5 mL 1  . tacrolimus (PROTOPIC) 0.1 % ointment Apply topically 2 (two) times daily. To inflamed/rash areas QD-BID PRN. 100 g 3  . doxycycline (ORACEA) 40 MG capsule Take one cap po QD with food 30 capsule 5  . Omega-3 Fatty Acids (FISH OIL) 1000 MG CAPS Take by mouth. (Patient not taking: Reported on 12/12/2020)     No facility-administered medications prior to visit.    No Known Allergies  Review of Systems  Constitutional: Negative.   HENT: Negative.   Eyes: Negative.   Respiratory: Negative.   Cardiovascular: Negative.   Gastrointestinal: Negative.   Genitourinary: Negative.   Musculoskeletal: Negative.   Skin: Positive  for rash (see hpi).  Neurological: Negative.   Psychiatric/Behavioral: Negative.        Objective:    Physical Exam Vitals and nursing note reviewed.  Constitutional:      Appearance: Normal appearance.  HENT:     Head: Normocephalic.  Eyes:     Conjunctiva/sclera: Conjunctivae normal.  Musculoskeletal:     Cervical back: Normal range of motion.  Skin:    General: Skin is warm.     Findings: Rash present.     Comments: Straight line rash with clear vesicles noted to right hand, bilateral arms, chest, left leg, and face near right eye.  Neurological:     General: No focal deficit present.     Mental Status: He is alert and oriented to person, place, and time.  Psychiatric:        Mood and Affect: Mood normal.        Behavior: Behavior normal.        Thought Content: Thought content normal.        Judgment: Judgment normal.     BP 110/67   Pulse 81   Temp 98.7 F (37.1 C) (Oral)   Wt 201 lb 3.2 oz (91.3 kg)   SpO2 98%   BMI 28.87 kg/m  Wt Readings from Last 3 Encounters:  02/20/21 201 lb 3.2 oz (91.3 kg)  08/16/20 208 lb 6.4 oz (94.5 kg)  02/08/20 202 lb 8 oz (91.9 kg)    Health Maintenance Due  Topic Date Due  . COVID-19 Vaccine (3 - Pfizer risk 4-dose series) 05/03/2020    There are no preventive care reminders to display for this patient.   Lab Results  Component Value Date   TSH 1.560 08/16/2020   Lab Results  Component Value Date   WBC 6.9 08/16/2020   HGB 15.7 08/16/2020   HCT 45.4 08/16/2020   MCV 95 08/16/2020   PLT 269 08/16/2020   Lab Results  Component Value Date   NA 142 08/16/2020   K 4.5 08/16/2020   CO2 22 08/16/2020   GLUCOSE 120 (H) 08/16/2020   BUN 16 08/16/2020   CREATININE 1.09 08/16/2020   BILITOT 0.2 08/16/2020   ALKPHOS 75 08/16/2020   AST 27 08/16/2020   ALT 59 (H) 08/16/2020   PROT 6.6 08/16/2020   ALBUMIN 4.5 08/16/2020   CALCIUM 9.3 08/16/2020   Lab Results  Component Value Date   CHOL 201 (H) 08/16/2020    Lab Results  Component Value Date   HDL 32 (L) 08/16/2020   Lab Results  Component Value Date   LDLCALC 91 08/16/2020   Lab Results  Component Value Date   TRIG 469 (H) 08/16/2020   No results found for: CHOLHDL No  results found for: HGBA1C     Assessment & Plan:   Problem List Items Addressed This Visit   None   Visit Diagnoses    Poison ivy    -  Primary   Will treat with kenalog IM and prednisone taper. Continue calamine lotion prn. Follow-up if symptoms worsen or don't improve.    Relevant Medications   triamcinolone acetonide (KENALOG-40) injection 40 mg (Completed)       Meds ordered this encounter  Medications  . predniSONE (DELTASONE) 10 MG tablet    Sig: Take 6 tablets today, then 5 tablets tomorrow, then decrease by 1 tablet every day until gone    Dispense:  21 tablet    Refill:  0  . triamcinolone acetonide (KENALOG-40) injection 40 mg     Charyl Dancer, NP

## 2021-02-20 NOTE — Telephone Encounter (Signed)
noted 

## 2021-02-20 NOTE — Telephone Encounter (Signed)
FYI

## 2021-02-20 NOTE — Patient Instructions (Signed)
Poison Ivy Dermatitis Poison ivy dermatitis is inflammation of the skin that is caused by chemicals in the leaves of the poison ivy plant. The skin reaction often involves redness, swelling, blisters, and extreme itching. What are the causes? This condition is caused by a chemical (urushiol) found in the sap of the poison ivy plant. This chemical is sticky and can be easily spread to people, animals, and objects. You can get poison ivy dermatitis by:  Having direct contact with a poison ivy plant.  Touching animals, other people, or objects that have come in contact with poison ivy and have the chemical on them. What increases the risk? This condition is more likely to develop in people who:  Are outdoors often in wooded or [place] areas.  Go outdoors without wearing protective clothing, such as closed shoes, long pants, and a long-sleeved shirt. What are the signs or symptoms? Symptoms of this condition include:  Redness of the skin.  Extreme itching.  A rash that often includes bumps and blisters. The rash usually appears 48 hours after exposure, if you have been exposed before. If this is the first time you have been exposed, the rash may not appear until a week after exposure.  Swelling. This may occur if the reaction is more severe. Symptoms usually last for 1-2 weeks. However, the first time you develop this condition, symptoms may last 3-4 weeks.   How is this diagnosed? This condition may be diagnosed based on your symptoms and a physical exam. Your health care provider may also ask you about any recent outdoor activity. How is this treated? Treatment for this condition will vary depending on how severe it is. Treatment may include:  Hydrocortisone cream or calamine lotion to relieve itching.  Oatmeal baths to soothe the skin.  Medicines, such as over-the-counter antihistamine tablets.  Oral steroid medicine, for more severe reactions. Follow these instructions at  home: Medicines  Take or apply over-the-counter and prescription medicines only as told by your health care provider.  Use hydrocortisone cream or calamine lotion as needed to soothe the skin and relieve itching. General instructions  Do not scratch or rub your skin.  Apply a cold, wet cloth (cold compress) to the affected areas or take baths in cool water. This will help with itching. Avoid hot baths and showers.  Take oatmeal baths as needed. Use colloidal oatmeal. You can get this at your local pharmacy or grocery store. Follow the instructions on the packaging.  While you have the rash, wash clothes right after you wear them.  Keep all follow-up visits as told by your health care provider. This is important. How is this prevented?  Learn to identify the poison ivy plant and avoid contact with the plant. This plant can be recognized by the number of leaves. Generally, poison ivy has three leaves with flowering branches on a single stem. The leaves are typically glossy, and they have jagged edges that come to a point at the front.  If you have been exposed to poison ivy, thoroughly wash with soap and water right away. You have about 30 minutes to remove the plant resin before it will cause the rash. Be sure to wash under your fingernails, because any plant resin there will continue to spread the rash.  When hiking or camping, wear clothes that will help you to avoid exposure on the skin. This includes long pants, a long-sleeved shirt, tall socks, and hiking boots. You can also apply preventive lotion to your skin  to help limit exposure.  If you suspect that your clothes or outdoor gear came in contact with poison ivy, rinse them off outside with a garden hose before you bring them inside your house.  When doing yard work or gardening, wear gloves, long sleeves, long pants, and boots. Wash your garden tools and gloves if they come in contact with poison ivy.  If you suspect that your  pet has come into contact with poison ivy, wash him or her with pet shampoo and water. Make sure to wear gloves while washing your pet.   Contact a health care provider if you have:  Open sores in the rash area.  More redness, swelling, or pain in the affected area.  Redness that spreads beyond the rash area.  Fluid, blood, or pus coming from the affected area.  A fever.  A rash over a large area of your body.  A rash on your eyes, mouth, or genitals.  A rash that does not improve after a few weeks. Get help right away if:  Your face swells or your eyes swell shut.  You have trouble breathing.  You have trouble swallowing. These symptoms may represent a serious problem that is an emergency. Do not wait to see if the symptoms will go away. Get medical help right away. Call your local emergency services (911 in the [place] Do not drive yourself to the hospital. Summary  Poison ivy dermatitis is inflammation of the skin that is caused by chemicals in the leaves of the poison ivy plant.  Symptoms of this condition include redness, itching, a rash, and swelling.  Do not scratch or rub your skin.  Take or apply over-the-counter and prescription medicines only as told by your health care provider. This information is not intended to replace advice given to you by your health care provider. Make sure you discuss any questions you have with your health care provider. Document Revised: 01/30/2019 Document Reviewed: 10/03/2018 Elsevier Patient Education  2021 [employer].

## 2021-03-06 NOTE — Progress Notes (Deleted)
There were no vitals taken for this visit.   Subjective:    Patient ID: Eric Kline, male    DOB: 09-08-1981, 40 y.o.   MRN: [id number]  HPI: Eric Kline is a 40 y.o. male  No chief complaint on file.  UPPER RESPIRATORY TRACT INFECTION Worst symptom: Fever: {Blank single:19197::"yes","no"} Cough: {Blank single:19197::"yes","no"} Shortness of breath: {Blank single:19197::"yes","no"} Wheezing: {Blank single:19197::"yes","no"} Chest pain: {Blank single:19197::"yes","no","yes, with cough"} Chest tightness: {Blank single:19197::"yes","no"} Chest congestion: {Blank single:19197::"yes","no"} Nasal congestion: {Blank single:19197::"yes","no"} Runny nose: {Blank single:19197::"yes","no"} Post nasal drip: {Blank single:19197::"yes","no"} Sneezing: {Blank single:19197::"yes","no"} Sore throat: {Blank single:19197::"yes","no"} Swollen glands: {Blank single:19197::"yes","no"} Sinus pressure: {Blank single:19197::"yes","no"} Headache: {Blank single:19197::"yes","no"} Face pain: {Blank single:19197::"yes","no"} Toothache: {Blank single:19197::"yes","no"} Ear pain: {Blank single:19197::"yes","no"} {Blank single:19197::""right","left","bilateral"} Ear pressure: {Blank single:19197::"yes","no"} {Blank single:19197::""right","left","bilateral"} Eyes red/itching:{Blank single:19197::"yes","no"} Eye drainage/crusting: {Blank single:19197::"yes","no"}  Vomiting: {Blank single:19197::"yes","no"} Rash: {Blank single:19197::"yes","no"} Fatigue: {Blank single:19197::"yes","no"} Sick contacts: {Blank single:19197::"yes","no"} Strep contacts: {Blank single:19197::"yes","no"}  Context: {Blank multiple:19196::"better","worse","stable","fluctuating"} Recurrent sinusitis: {Blank single:19197::"yes","no"} Relief with OTC cold/cough medications: {Blank single:19197::"yes","no"}  Treatments attempted: {Blank multiple:19196::"none","cold/sinus","mucinex","anti-histamine","pseudoephedrine","cough  syrup","antibiotics"}   Relevant past medical, surgical, family and social history reviewed and updated as indicated. Interim medical history since our last visit reviewed. Allergies and medications reviewed and updated.  Review of Systems  Per HPI unless specifically indicated above     Objective:    There were no vitals taken for this visit.  Wt Readings from Last 3 Encounters:  02/20/21 201 lb 3.2 oz (91.3 kg)  08/16/20 208 lb 6.4 oz (94.5 kg)  02/08/20 202 lb 8 oz (91.9 kg)    Physical Exam  Results for orders placed or performed in visit on 08/16/20  CBC with Differential/Platelet  Result Value Ref Range   WBC 6.9 3.4 - 10.8 x10E3/uL   RBC 4.78 4.14 - 5.80 x10E6/uL   Hemoglobin 15.7 13.0 - 17.7 g/dL   Hematocrit 45.4 37.5 - 51.0 %   MCV 95 79 - 97 fL   MCH 32.8 26.6 - 33.0 pg   MCHC 34.6 31.5 - 35.7 g/dL   RDW 12.7 11.6 - 15.4 %   Platelets 269 150 - 450 x10E3/uL   Neutrophils 67 Not Estab. %   Lymphs 23 Not Estab. %   Monocytes 7 Not Estab. %   Eos 2 Not Estab. %   Basos 1 Not Estab. %   Neutrophils Absolute 4.6 1.4 - 7.0 x10E3/uL   Lymphocytes Absolute 1.6 0.7 - 3.1 x10E3/uL   Monocytes Absolute 0.5 0.1 - 0.9 x10E3/uL   EOS (ABSOLUTE) 0.2 0.0 - 0.4 x10E3/uL   Basophils Absolute 0.0 0.0 - 0.2 x10E3/uL   Immature Granulocytes 0 Not Estab. %   Immature Grans (Abs) 0.0 0.0 - 0.1 x10E3/uL  Comprehensive metabolic panel  Result Value Ref Range   Glucose 120 (H) 65 - 99 mg/dL   BUN 16 6 - 24 mg/dL   Creatinine, Ser 1.09 0.76 - 1.27 mg/dL   GFR calc non Af Amer 84 >59 mL/min/1.73   GFR calc Af Amer 97 >59 mL/min/1.73   BUN/Creatinine Ratio 15 9 - 20   Sodium 142 134 - 144 mmol/L   Potassium 4.5 3.5 - 5.2 mmol/L   Chloride 105 96 - 106 mmol/L   CO2 22 20 - 29 mmol/L   Calcium 9.3 8.7 - 10.2 mg/dL   Total Protein 6.6 6.0 - 8.5 g/dL   Albumin 4.5 4.0 - 5.0 g/dL   Globulin, Total 2.1 1.5 - 4.5 g/dL   Albumin/Globulin Ratio 2.1 1.2 - 2.2   Bilirubin Total  0.2  0.0 - 1.2 mg/dL   Alkaline Phosphatase 75 44 - 121 IU/L   AST 27 0 - 40 IU/L   ALT 59 (H) 0 - 44 IU/L  Lipid Panel w/o Chol/HDL Ratio  Result Value Ref Range   Cholesterol, Total 201 (H) 100 - 199 mg/dL   Triglycerides 469 (H) 0 - 149 mg/dL   HDL 32 (L) >39 mg/dL   VLDL Cholesterol Cal 78 (H) 5 - 40 mg/dL   LDL Chol Calc (NIH) 91 0 - 99 mg/dL  TSH  Result Value Ref Range   TSH 1.560 0.450 - 4.500 uIU/mL  Testosterone, free, total([facility])  Result Value Ref Range   Testosterone 274 264 - 916 ng/dL   Testosterone, Free 13.6 6.8 - 21.5 pg/mL   Sex Hormone Binding 32.3 16.5 - 55.9 nmol/L  Hepatitis C antibody  Result Value Ref Range   Hep C Virus Ab <0.1 0.0 - 0.9 s/co ratio      Assessment & Plan:   Problem List Items Addressed This Visit   None      Follow up plan: No follow-ups on file.     This visit was completed via MyChart due to the restrictions of the COVID-19 pandemic. All issues as above were discussed and addressed. Physical exam was done as above through visual confirmation on MyChart. If it was felt that the patient should be evaluated in the office, they were directed there. The patient verbally consented to this visit. 1. Location of the patient: Home 2. Location of the provider: Office 3. Those involved with this call:  ? Provider: Jon Billings, NP ? CMA: Tiffany Reel, CMA ? Front Desk/Registration: Levert Feinstein 4. Time spent on call: 15 minutes with patient face to face via video conference. More than 50% of this time was spent in counseling and coordination of care. 20 minutes total spent in review of patient's record and preparation of their chart.

## 2021-03-07 ENCOUNTER — Other Ambulatory Visit: Payer: Self-pay

## 2021-03-07 ENCOUNTER — Telehealth (INDEPENDENT_AMBULATORY_CARE_PROVIDER_SITE_OTHER): Payer: Federal, State, Local not specified - PPO | Admitting: Nurse Practitioner

## 2021-03-07 DIAGNOSIS — Z538 Procedure and treatment not carried out for other reasons: Secondary | ICD-10-CM

## 2021-03-08 NOTE — Progress Notes (Signed)
Patient cancelled appt.

## 2021-03-23 DIAGNOSIS — F1021 Alcohol dependence, in remission: Secondary | ICD-10-CM | POA: Diagnosis not present

## 2021-03-23 DIAGNOSIS — F329 Major depressive disorder, single episode, unspecified: Secondary | ICD-10-CM | POA: Diagnosis not present

## 2021-03-23 DIAGNOSIS — F419 Anxiety disorder, unspecified: Secondary | ICD-10-CM | POA: Diagnosis not present

## 2021-03-24 DIAGNOSIS — Z20828 Contact with and (suspected) exposure to other viral communicable diseases: Secondary | ICD-10-CM | POA: Diagnosis not present

## 2021-05-12 ENCOUNTER — Ambulatory Visit: Payer: Self-pay | Admitting: *Deleted

## 2021-05-12 NOTE — Telephone Encounter (Signed)
Patient calling to report he has 2 possible spider bites- painful, red and itching. Appointment scheduled.

## 2021-05-12 NOTE — Telephone Encounter (Signed)
FYI scheduled monday

## 2021-05-12 NOTE — Telephone Encounter (Signed)
Reason for Disposition  [1] Red or very tender (to touch) area AND [2] getting larger over 48 hours after the bite  Answer Assessment - Initial Assessment Questions 1. TYPE of INSECT: "What type of insect was it?"      Not sure 2. ONSET: "When did you get bitten?"      yesterday 3. LOCATION: "Where is the insect bite located?"      2 bites- R inner thigh, L calve below knee 4. REDNESS: "Is the area red or pink?" If Yes, ask: "What size is area of redness?" (inches or cm). "When did the redness start?"     Nickel size- weeping 5. PAIN: "Is there any pain?" If Yes, ask: "How bad is it?"  (Scale 1-10; or mild, moderate, severe)     Mild/moderate 6. ITCHING: "Does it itch?" If Yes, ask: "How bad is the itch?"    - MILD: doesn't interfere with normal activities   - MODERATE-SEVERE: interferes with work, school, sleep, or other activities      Yes- severe 7. SWELLING: "How big is the swelling?" (inches, cm, or compare to coins)     Nickel size- slightly swollen 8. OTHER SYMPTOMS: "Do you have any other symptoms?"  (e.g., difficulty breathing, hives)     no 9. PREGNANCY: "Is there any chance you are pregnant?" "When was your last menstrual period?"     N/a  Protocols used: Insect Bite-A-AH, [place]

## 2021-05-15 ENCOUNTER — Ambulatory Visit: Payer: Federal, State, Local not specified - PPO | Admitting: Nurse Practitioner

## 2021-06-03 ENCOUNTER — Telehealth: Payer: Federal, State, Local not specified - PPO | Admitting: Nurse Practitioner

## 2021-06-03 DIAGNOSIS — R059 Cough, unspecified: Secondary | ICD-10-CM | POA: Diagnosis not present

## 2021-06-03 MED ORDER — AZITHROMYCIN 250 MG PO TABS: ORAL_TABLET | ORAL | 0 refills | Status: DC

## 2021-06-03 MED ORDER — BENZONATATE 100 MG PO CAPS: 100.0000 mg | ORAL_CAPSULE | Freq: Three times a day (TID) | ORAL | 0 refills | Status: DC | PRN

## 2021-06-03 NOTE — Progress Notes (Signed)
We are sorry that you are not feeling well.  Here is how we plan to help! ? ?Based on your presentation I believe you most likely have A cough due to bacteria.  When patients have a fever and a productive cough with a change in color or increased sputum production, we are concerned about bacterial bronchitis.  If left untreated it can progress to pneumonia.  If your symptoms do not improve with your treatment plan it is important that you contact your provider.   I have prescribed Azithromyin 250 mg: two tablets now and then one tablet daily for 4 additonal days  ?  ?In addition you may use A prescription cough medication called Tessalon Perles 100mg. You may take 1-2 capsules every 8 hours as needed for your cough. ? ? ?From your responses in the eVisit questionnaire you describe inflammation in the upper respiratory tract which is causing a significant cough.  This is commonly called Bronchitis and has four common causes:   ?Allergies ?Viral Infections ?Acid Reflux ?Bacterial Infection ?Allergies, viruses and acid reflux are treated by controlling symptoms or eliminating the cause. An example might be a cough caused by taking certain blood pressure medications. You stop the cough by changing the medication. Another example might be a cough caused by acid reflux. Controlling the reflux helps control the cough. ? ?USE OF BRONCHODILATOR ("RESCUE") INHALERS: ?There is a risk from using your bronchodilator too frequently.  The risk is that over-reliance on a medication which only relaxes the muscles surrounding the breathing tubes can reduce the effectiveness of medications prescribed to reduce swelling and congestion of the tubes themselves.  Although you feel brief relief from the bronchodilator inhaler, your asthma may actually be worsening with the tubes becoming more swollen and filled with mucus.  This can delay other crucial treatments, such as oral steroid medications. If you need to use a bronchodilator  inhaler daily, several times per day, you should discuss this with your provider.  There are probably better treatments that could be used to keep your asthma under control.  ?   ?HOME CARE ?Only take medications as instructed by your medical team. ?Complete the entire course of an antibiotic. ?Drink plenty of fluids and get plenty of rest. ?Avoid close contacts especially the very young and the elderly ?Cover your mouth if you cough or cough into your sleeve. ?Always remember to wash your hands ?A steam or ultrasonic humidifier can help congestion.  ? ?GET HELP RIGHT AWAY IF: ?You develop worsening fever. ?You become short of breath ?You cough up blood. ?Your symptoms persist after you have completed your treatment plan ?MAKE SURE YOU  ?Understand these instructions. ?Will watch your condition. ?Will get help right away if you are not doing well or get worse. ?  ? ?Thank you for choosing an e-visit. ? ?Your e-visit answers were reviewed by a board certified advanced clinical practitioner to complete your personal care plan. Depending upon the condition, your plan could have included both over the counter or prescription medications. ? ?Please review your pharmacy choice. Make sure the pharmacy is open so you can pick up prescription now. If there is a problem, you may contact your provider through MyChart messaging and have the prescription routed to another pharmacy.  Your safety is important to us. If you have drug allergies check your prescription carefully.  ? ?For the next 24 hours you can use MyChart to ask questions about today's visit, request a non-urgent call back, or ask   for a work or school excuse. ?You will get an email in the next two days asking about your experience. I hope that your e-visit has been valuable and will speed your recovery. ? ?5-10 minutes spent reviewing and documenting in chart. ? ?

## 2021-06-12 ENCOUNTER — Ambulatory Visit: Payer: Federal, State, Local not specified - PPO | Admitting: Dermatology

## 2021-06-27 DIAGNOSIS — M2242 Chondromalacia patellae, left knee: Secondary | ICD-10-CM | POA: Diagnosis not present

## 2021-08-10 ENCOUNTER — Encounter: Payer: Self-pay | Admitting: Nurse Practitioner

## 2021-08-18 ENCOUNTER — Encounter: Payer: 59 | Admitting: Nurse Practitioner

## 2021-08-18 DIAGNOSIS — Z136 Encounter for screening for cardiovascular disorders: Secondary | ICD-10-CM

## 2021-08-18 DIAGNOSIS — E538 Deficiency of other specified B group vitamins: Secondary | ICD-10-CM

## 2021-08-18 DIAGNOSIS — R7401 Elevation of levels of liver transaminase levels: Secondary | ICD-10-CM

## 2021-08-18 DIAGNOSIS — Z0000 Encounter for general adult medical examination without abnormal findings: Secondary | ICD-10-CM

## 2021-08-18 DIAGNOSIS — R7309 Other abnormal glucose: Secondary | ICD-10-CM

## 2021-08-18 DIAGNOSIS — D508 Other iron deficiency anemias: Secondary | ICD-10-CM

## 2021-09-20 ENCOUNTER — Ambulatory Visit: Payer: Self-pay

## 2021-09-20 NOTE — Telephone Encounter (Signed)
Noted  

## 2021-09-20 NOTE — Progress Notes (Signed)
BP 121/76   Pulse 69   Temp 98.3 F (36.8 C) (Oral)   Wt 197 lb 9.6 oz (89.6 kg)   SpO2 98%   BMI 28.35 kg/m    Subjective:    Patient ID: Eric Kline, male    DOB: 1981/02/02, 40 y.o.   MRN: [id number]  HPI: Eric Kline is a 40 y.o. male  Chief Complaint  Patient presents with   Cold like symptoms    Has had for the past 6 to 8 weeks. Having cough, congestion fatigue, and mild body aches since 11/20    UPPER RESPIRATORY TRACT INFECTION Worst symptom: Patient states symptoms have been going on for 6-8 weeks.  Started with mild body aches 11/20. Fever:  possibly over thanksgiving weekend Cough: yes Shortness of breath:  on a deep inhale he feels a rattle Wheezing: yes over the weekend Chest pain: no Chest tightness: yes Chest congestion: yes Nasal congestion: yes Runny nose: no Post nasal drip: yes Sneezing:  occasional Sore throat: no Swollen glands: no Sinus pressure:  some Headache: no Face pain: no Toothache: no Ear pain: no bilateral Ear pressure: yes bilateral Eyes red/itching:no Eye drainage/crusting: no  Vomiting: no Rash: no Fatigue: yes Sick contacts: yes Strep contacts: no  Context: worse Recurrent sinusitis: no Relief with OTC cold/cough medications: yes,  Treatments attempted: mucinex and cough syrup   Relevant past medical, surgical, family and social history reviewed and updated as indicated. Interim medical history since our last visit reviewed. Allergies and medications reviewed and updated.  Review of Systems  Constitutional:  Positive for fatigue and fever.  HENT:  Positive for congestion and postnasal drip. Negative for ear pain, rhinorrhea, sinus pressure, sinus pain, sneezing and sore throat.   Respiratory:  Positive for cough, chest tightness and shortness of breath. Negative for wheezing.   Gastrointestinal:  Negative for vomiting.  Skin:  Negative for rash.  Neurological:  Negative for headaches.   Per HPI unless  specifically indicated above     Objective:    BP 121/76   Pulse 69   Temp 98.3 F (36.8 C) (Oral)   Wt 197 lb 9.6 oz (89.6 kg)   SpO2 98%   BMI 28.35 kg/m   Wt Readings from Last 3 Encounters:  09/21/21 197 lb 9.6 oz (89.6 kg)  02/20/21 201 lb 3.2 oz (91.3 kg)  08/16/20 208 lb 6.4 oz (94.5 kg)    Physical Exam Vitals and nursing note reviewed.  Constitutional:      General: He is not in acute distress.    Appearance: Normal appearance. He is not ill-appearing, toxic-appearing or diaphoretic.  HENT:     Head: Normocephalic.     Right Ear: Tympanic membrane and external ear normal. There is no impacted cerumen.     Left Ear: Tympanic membrane and external ear normal. There is no impacted cerumen.     Nose: Nose normal. No congestion or rhinorrhea.     Right Sinus: No maxillary sinus tenderness or frontal sinus tenderness.     Left Sinus: No maxillary sinus tenderness or frontal sinus tenderness.     Mouth/Throat:     Mouth: Mucous membranes are moist.     Pharynx: Posterior oropharyngeal erythema present. No oropharyngeal exudate.  Eyes:     General:        Right eye: No discharge.        Left eye: No discharge.     Extraocular Movements: Extraocular movements intact.     Conjunctiva/sclera:  Conjunctivae normal.     Pupils: Pupils are equal, round, and reactive to light.  Cardiovascular:     Rate and Rhythm: Normal rate and regular rhythm.     Heart sounds: No murmur heard. Pulmonary:     Effort: Pulmonary effort is normal. No respiratory distress.     Breath sounds: Normal breath sounds. No wheezing, rhonchi or rales.  Abdominal:     General: Abdomen is flat. Bowel sounds are normal.  Musculoskeletal:     Cervical back: Normal range of motion and neck supple.  Skin:    General: Skin is warm and dry.     Capillary Refill: Capillary refill takes less than 2 seconds.  Neurological:     General: No focal deficit present.     Mental Status: He is alert and oriented  to person, place, and time.  Psychiatric:        Mood and Affect: Mood normal.        Behavior: Behavior normal.        Thought Content: Thought content normal.        Judgment: Judgment normal.    Results for orders placed or performed in visit on 08/16/20  CBC with Differential/Platelet  Result Value Ref Range   WBC 6.9 3.4 - 10.8 x10E3/uL   RBC 4.78 4.14 - 5.80 x10E6/uL   Hemoglobin 15.7 13.0 - 17.7 g/dL   Hematocrit 45.4 37.5 - 51.0 %   MCV 95 79 - 97 fL   MCH 32.8 26.6 - 33.0 pg   MCHC 34.6 31.5 - 35.7 g/dL   RDW 12.7 11.6 - 15.4 %   Platelets 269 150 - 450 x10E3/uL   Neutrophils 67 Not Estab. %   Lymphs 23 Not Estab. %   Monocytes 7 Not Estab. %   Eos 2 Not Estab. %   Basos 1 Not Estab. %   Neutrophils Absolute 4.6 1.4 - 7.0 x10E3/uL   Lymphocytes Absolute 1.6 0.7 - 3.1 x10E3/uL   Monocytes Absolute 0.5 0.1 - 0.9 x10E3/uL   EOS (ABSOLUTE) 0.2 0.0 - 0.4 x10E3/uL   Basophils Absolute 0.0 0.0 - 0.2 x10E3/uL   Immature Granulocytes 0 Not Estab. %   Immature Grans (Abs) 0.0 0.0 - 0.1 x10E3/uL  Comprehensive metabolic panel  Result Value Ref Range   Glucose 120 (H) 65 - 99 mg/dL   BUN 16 6 - 24 mg/dL   Creatinine, Ser 1.09 0.76 - 1.27 mg/dL   GFR calc non Af Amer 84 >59 mL/min/1.73   GFR calc Af Amer 97 >59 mL/min/1.73   BUN/Creatinine Ratio 15 9 - 20   Sodium 142 134 - 144 mmol/L   Potassium 4.5 3.5 - 5.2 mmol/L   Chloride 105 96 - 106 mmol/L   CO2 22 20 - 29 mmol/L   Calcium 9.3 8.7 - 10.2 mg/dL   Total Protein 6.6 6.0 - 8.5 g/dL   Albumin 4.5 4.0 - 5.0 g/dL   Globulin, Total 2.1 1.5 - 4.5 g/dL   Albumin/Globulin Ratio 2.1 1.2 - 2.2   Bilirubin Total 0.2 0.0 - 1.2 mg/dL   Alkaline Phosphatase 75 44 - 121 IU/L   AST 27 0 - 40 IU/L   ALT 59 (H) 0 - 44 IU/L  Lipid Panel w/o Chol/HDL Ratio  Result Value Ref Range   Cholesterol, Total 201 (H) 100 - 199 mg/dL   Triglycerides 469 (H) 0 - 149 mg/dL   HDL 32 (L) >39 mg/dL   VLDL Cholesterol Cal 78 (H) 5 -  40 mg/dL    LDL Chol Calc (NIH) 91 0 - 99 mg/dL  TSH  Result Value Ref Range   TSH 1.560 0.450 - 4.500 uIU/mL  Testosterone, free, total([facility])  Result Value Ref Range   Testosterone 274 264 - 916 ng/dL   Testosterone, Free 13.6 6.8 - 21.5 pg/mL   Sex Hormone Binding 32.3 16.5 - 55.9 nmol/L  Hepatitis C antibody  Result Value Ref Range   Hep C Virus Ab <0.1 0.0 - 0.9 s/co ratio      Assessment & Plan:   Problem List Items Addressed This Visit   None Visit Diagnoses     Viral upper respiratory tract infection    -  Primary   Ongoing for 6-8 weeks. Will treat with prednisone, albuterol, and Augmentin. Discussed how to use medications properly. Follow up if symptoms worsen.   Acute cough       Relevant Orders   Novel Coronavirus, NAA ([facility])   Veritor Flu A/B Waived   Chest congestion       Relevant Orders   Novel Coronavirus, NAA ([facility])   Veritor Flu A/B Waived        Follow up plan: Return if symptoms worsen or fail to improve.

## 2021-09-20 NOTE — Telephone Encounter (Signed)
FYI for appointment tomorrow.  

## 2021-09-20 NOTE — Telephone Encounter (Signed)
Patient called in stating that he's had a cough and congestion for about 6 weeks, took covid at home test which was negative, family suggested that he takes an antigen test   Pt. Reports he has had a cough x 6 weeks. Productive at times. Concerned he may have had COVID 19 and did not know it and is having lingering affects. Pt. Has an appointment tomorrow.    Answer Assessment - Initial Assessment Questions 1. ONSET: "When did the cough begin?"      6 weeks ago 2. SEVERITY: "How bad is the cough today?"      Moderate 3. SPUTUM: "Describe the color of your sputum" (none, dry cough; clear, white, yellow, green)     White 4. HEMOPTYSIS: "Are you coughing up any blood?" If so ask: "How much?" (flecks, streaks, tablespoons, etc.)     No 5. DIFFICULTY BREATHING: "Are you having difficulty breathing?" If Yes, ask: "How bad is it?" (e.g., mild, moderate, severe)    - MILD: No SOB at rest, mild SOB with walking, speaks normally in sentences, can lie down, no retractions, pulse < 100.    - MODERATE: SOB at rest, SOB with minimal exertion and prefers to sit, cannot lie down flat, speaks in phrases, mild retractions, audible wheezing, pulse 100-120.    - SEVERE: Very SOB at rest, speaks in single words, struggling to breathe, sitting hunched forward, retractions, pulse > 120      None 6. FEVER: "Do you have a fever?" If Yes, ask: "What is your temperature, how was it measured, and when did it start?"     No 7. CARDIAC HISTORY: "Do you have any history of heart disease?" (e.g., heart attack, congestive heart failure)      No 8. LUNG HISTORY: "Do you have any history of lung disease?"  (e.g., pulmonary embolus, asthma, emphysema)     No 9. PE RISK FACTORS: "Do you have a history of blood clots?" (or: recent major surgery, recent prolonged travel, bedridden)     No 10. OTHER SYMPTOMS: "Do you have any other symptoms?" (e.g., runny nose, wheezing, chest pain)       No 11. PREGNANCY: "Is there any  chance you are pregnant?" "When was your last menstrual period?"       N/a 12. TRAVEL: "Have you traveled out of the country in the last month?" (e.g., travel history, exposures)       No  Protocols used: Cough - Acute Productive-A-AH

## 2021-09-21 ENCOUNTER — Other Ambulatory Visit: Payer: Self-pay

## 2021-09-21 ENCOUNTER — Ambulatory Visit: Payer: Federal, State, Local not specified - PPO | Admitting: Nurse Practitioner

## 2021-09-21 ENCOUNTER — Encounter: Payer: Self-pay | Admitting: Nurse Practitioner

## 2021-09-21 VITALS — BP 121/76 | HR 69 | Temp 98.3°F | Wt 197.6 lb

## 2021-09-21 DIAGNOSIS — R0989 Other specified symptoms and signs involving the circulatory and respiratory systems: Secondary | ICD-10-CM

## 2021-09-21 DIAGNOSIS — R051 Acute cough: Secondary | ICD-10-CM

## 2021-09-21 DIAGNOSIS — J069 Acute upper respiratory infection, unspecified: Secondary | ICD-10-CM

## 2021-09-21 LAB — VERITOR FLU A/B WAIVED
Influenza A: NEGATIVE
Influenza B: NEGATIVE

## 2021-09-21 MED ORDER — ALBUTEROL SULFATE HFA 108 (90 BASE) MCG/ACT IN AERS: 2.0000 | INHALATION_SPRAY | Freq: Four times a day (QID) | RESPIRATORY_TRACT | 0 refills | Status: DC | PRN

## 2021-09-21 MED ORDER — AMOXICILLIN-POT CLAVULANATE 875-125 MG PO TABS: 1.0000 | ORAL_TABLET | Freq: Two times a day (BID) | ORAL | 0 refills | Status: AC

## 2021-09-21 MED ORDER — PREDNISONE 10 MG PO TABS: 10.0000 mg | ORAL_TABLET | Freq: Every day | ORAL | 0 refills | Status: DC

## 2021-09-21 NOTE — Progress Notes (Signed)
Results discussed with patient during visit.

## 2021-09-22 LAB — NOVEL CORONAVIRUS, NAA: SARS-CoV-2, NAA: NOT DETECTED

## 2021-09-22 NOTE — Progress Notes (Signed)
Hi Eric Kline.  Your COVID test was negative.

## 2021-09-26 ENCOUNTER — Encounter: Payer: Self-pay | Admitting: Nurse Practitioner

## 2021-09-26 ENCOUNTER — Ambulatory Visit (INDEPENDENT_AMBULATORY_CARE_PROVIDER_SITE_OTHER): Payer: Federal, State, Local not specified - PPO | Admitting: Nurse Practitioner

## 2021-09-26 ENCOUNTER — Other Ambulatory Visit: Payer: Self-pay

## 2021-09-26 VITALS — BP 119/81 | HR 85 | Temp 98.2°F | Ht 70.0 in | Wt 210.8 lb

## 2021-09-26 DIAGNOSIS — Z136 Encounter for screening for cardiovascular disorders: Secondary | ICD-10-CM | POA: Diagnosis not present

## 2021-09-26 DIAGNOSIS — Z0000 Encounter for general adult medical examination without abnormal findings: Secondary | ICD-10-CM

## 2021-09-26 DIAGNOSIS — R7309 Other abnormal glucose: Secondary | ICD-10-CM

## 2021-09-26 DIAGNOSIS — F1021 Alcohol dependence, in remission: Secondary | ICD-10-CM

## 2021-09-26 DIAGNOSIS — H9313 Tinnitus, bilateral: Secondary | ICD-10-CM | POA: Insufficient documentation

## 2021-09-26 DIAGNOSIS — Z13220 Encounter for screening for lipoid disorders: Secondary | ICD-10-CM

## 2021-09-26 DIAGNOSIS — R7401 Elevation of levels of liver transaminase levels: Secondary | ICD-10-CM | POA: Diagnosis not present

## 2021-09-26 DIAGNOSIS — R7989 Other specified abnormal findings of blood chemistry: Secondary | ICD-10-CM

## 2021-09-26 DIAGNOSIS — H9312 Tinnitus, left ear: Secondary | ICD-10-CM | POA: Insufficient documentation

## 2021-09-26 DIAGNOSIS — D509 Iron deficiency anemia, unspecified: Secondary | ICD-10-CM | POA: Diagnosis not present

## 2021-09-26 LAB — BAYER DCA HB A1C WAIVED: HB A1C (BAYER DCA - WAIVED): 4.9 % (ref 4.8–5.6)

## 2021-09-26 MED ORDER — NALTREXONE HCL 50 MG PO TABS: 50.0000 mg | ORAL_TABLET | Freq: Every day | ORAL | 4 refills | Status: DC

## 2021-09-26 NOTE — Patient Instructions (Signed)
Naltrexone tablets What is this medication? NALTREXONE (nal TREX one) helps you to remain free of your dependence on opiate drugs or alcohol. It blocks the 'high' that these substances can give you. This medicine is combined with counseling and support groups. This medicine may be used for other purposes; ask your health care provider or pharmacist if you have questions. COMMON BRAND NAME(S): Depade, ReVia What should I tell my care team before I take this medication? They need to know if you have any of these conditions: if you have used drugs or alcohol within 7 to 10 days kidney disease liver disease, including hepatitis an unusual or allergic reaction to naltrexone, other medicines, foods, dyes, or preservatives pregnant or trying to get pregnant breast-feeding How should I use this medication? Take this medicine by mouth with a full glass of water. Follow the directions on the prescription label. Do not take this medicine within 7 to 10 days of taking any opioid drugs. Take your medicine at regular intervals. Do not take your medicine more often than directed. Do not stop taking except on your doctor's advice. Talk to your pediatrician regarding the use of this medicine in children. Special care may be needed. Overdosage: If you think you have taken too much of this medicine contact a poison control center or emergency room at once. NOTE: This medicine is only for you. Do not share this medicine with others. What if I miss a dose? If you miss a dose and remember on the same day, take the missed dose. If you do not remember until the next day, ask your doctor or health care professional about rescheduling your doses. Do not take double or extra doses. What may interact with this medication? Do not take this medicine with any of the following medications: any prescription or street opioid drug like codiene, heroin, methadone This medicine may also interact with the following  medications: disulfiram thioridazine This list may not describe all possible interactions. Give your health care provider a list of all the medicines, herbs, non-prescription drugs, or dietary supplements you use. Also tell them if you smoke, drink alcohol, or use illegal drugs. Some items may interact with your medicine. What should I watch for while using this medication? Your condition will be monitored carefully while you are receiving this medicine. Visit your doctor or health care professional regularly. For this medicine to be most effective you should attend any counseling or support groups that your doctor or health care professional recommends. Do not try to overcome the effects of the medicine by taking large amounts of narcotics or by drinking large amounts of alcohol. This can cause severe problems including death. Also, you may be more sensitive to lower doses of narcotics after you stop taking this medicine. If you are going to have surgery, tell your doctor or health care professional that you are taking this medicine. Do not treat yourself for coughs, colds, pain, or diarrhea. Ask your doctor or health care professional for advice. Some of the ingredients may interact with this medicine and cause side effects. Wear a medical ID bracelet or chain, and carry a card that describes your disease and details of your medicine and dosage times. You may get drowsy or dizzy. Do not drive, use machinery, or do anything that needs mental alertness until you know how this medicine affects you. Do not stand or sit up quickly, especially if you are an older patient. This reduces the risk of dizzy or fainting spells. Alcohol  may interfere with the effect of this medicine. Avoid alcoholic drinks. What side effects may I notice from receiving this medication? Side effects that you should report to your doctor or health care professional as soon as possible: allergic reactions like skin rash, itching or  hives, swelling of the face, lips, or tongue breathing problems changes in vision, hearing confusion dark urine depressed mood diarrhea fast or irregular heart beat hallucination, loss of contact with reality light-colored stools right upper belly pain suicidal thoughts or other mood changes unusually weak or tired vomiting yellowing of the eyes or skin Side effects that usually do not require medical attention (report to your doctor or health care professional if they continue or are bothersome): aches, pains change in sex drive or performance feeling anxious headache loss of appetite, nausea runny nose, sinus problems, sneezing stomach pain trouble sleeping This list may not describe all possible side effects. Call your doctor for medical advice about side effects. You may report side effects to FDA at 1-800-FDA-1088. Where should I keep my medication? Keep out of the reach of children. Store at room temperature between 20 and 25 degrees C (68 and 77 degrees F). Throw away any unused medicine after the expiration date. NOTE: This sheet is a summary. It may not cover all possible information. If you have questions about this medicine, talk to your doctor, pharmacist, or health care provider.  2022 Elsevier/Gold Standard (2012-08-02 00:00:00)

## 2021-09-26 NOTE — Assessment & Plan Note (Signed)
History of heavier use, sober for 2 years.  Currently has returned to alcohol use and would like to try Naltrexone.  Will send in script for 50 MG daily and adjust as needed.  Recommend complete cessation alcohol use.  Labs: B12, ferritin, CBC, CMP today.

## 2021-09-26 NOTE — Assessment & Plan Note (Signed)
Ongoing for weeks -- ?polyp to left ear vs related to alcohol use.  Referral to ENT placed.

## 2021-09-26 NOTE — Assessment & Plan Note (Signed)
History of low levels -- recheck CBC, iron, ferritin, and B12 today.

## 2021-09-26 NOTE — Assessment & Plan Note (Addendum)
Recheck today, recommend complete cessation alcohol use.  Consider imaging if higher elevations noted. 

## 2021-09-26 NOTE — Assessment & Plan Note (Signed)
Noted on past labs -- A1c 4.9% today, discussed with patient no diabetes or prediabetes.

## 2021-09-26 NOTE — Progress Notes (Signed)
BP 119/81   Pulse 85   Temp 98.2 F (36.8 C)   Ht 5\' 10"  (1.778 m)   Wt 210 lb 12.8 oz (95.6 kg)   SpO2 97%   BMI 30.25 kg/m    Subjective:    Patient ID: Eric Kline, male    DOB: [birth year], 40 y.o.   MRN: [id number]  HPI: Eric Kline is a 40 y.o. male presenting on 09/26/2021 for comprehensive medical examination. Current medical complaints include:none  He currently lives with: self Interim Problems from his last visit: no   History of anemia on labs, took OTC iron. Would like testosterone level checked, last level was low normal.  History of heavy alcohol use, he reports returning to drinking over past year, but not as heavy as once did.  He was sober for 2 years.  Would like to start Naltrexone. [place] Office Visit from 09/26/2021 in [facility]  Alcohol Use Disorder Identification Test Final Score (AUDIT) 9        TINNITUS Noticed in July or August to left ear -- humming sound that changes pitch -- if does a quick pull on ear lobe will go away sometimes.  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 Duration: months Description of tinnitus: humming Pulsatile: no Tinnitus duration: "minutes -- 20-30 minutes, had one episode of 4-6 hours  Episode frequency: recurrent Severity: mild Aggravating factors: nothing Alleviating factors: pulling on ear lobe at times Head injury: yes -- history of concussion x 3 with LOC -- one in [employer] and two post military Chronic exposure to loud noises:  in past Exposure to ototoxic medications: no Vertigo:no Hearing loss: no Aural fullness: no Headache:no  TMJ syndrome symptoms: no Unsteady gait: no Postural instability: no Diplopia, dysarthria, dysphagia or weakness: no Anxietydepression: yes  DEPRESSION Noted on screening today -- he reports more financial and pain related.  Had shoulder injury over past months and followed with ortho.  He does endorse difficulty with sleep, staying asleep  is an issue.  Took Effexor in past which caused suicidal thoughts.  He feels at this time he does not need medication.  Attending therapy -- doing self care and journaling.   Mood status: stable Satisfied with current treatment?: yes Symptom severity: mild  Psychotherapy/counseling: yes current Previous psychiatric medications: Effexor Depressed mood: yes Anxious mood: yes Anhedonia: no Significant weight loss or gain: no Insomnia: yes hard to stay asleep Fatigue: no Feelings of worthlessness or guilt: no Impaired concentration/indecisiveness: no Suicidal ideations: no Hopelessness: no Crying spells: no Depression screen [facility] 2/9 09/26/2021 08/16/2020 07/28/2019 07/25/2018 07/25/2018  Decreased Interest 1 0 0 0 0  Down, Depressed, Hopeless 1 0 0 0 0  PHQ - 2 Score 2 0 0 0 0  Altered sleeping 2 - - 1 0  Tired, decreased energy 3 - - 0 0  Change in appetite 1 - - 0 0  Feeling bad or failure about yourself  1 - - 0 0  Trouble concentrating 1 - - 0 0  Moving slowly or fidgety/restless 1 - - 0 0  Suicidal thoughts 0 - - 0 0  PHQ-9 Score 11 - - 1 0  Difficult doing work/chores Somewhat difficult - - - -    GAD 7 : Generalized Anxiety Score 09/26/2021  Nervous, Anxious, on Edge 2  Control/stop worrying 1  Worry too much - different things 1  Trouble relaxing 2  Restless 1  Easily annoyed or irritable 1  Afraid -  awful might happen 0  Total GAD 7 Score 8  Anxiety Difficulty Somewhat difficult   Functional Status Survey: Is the patient deaf or have difficulty hearing?: No Does the patient have difficulty seeing, even when wearing glasses/contacts?: No Does the patient have difficulty concentrating, remembering, or making decisions?: No Does the patient have difficulty walking or climbing stairs?: No Does the patient have difficulty dressing or bathing?: No Does the patient have difficulty doing errands alone such as visiting a doctor's office or shopping?: No  FALL RISK: Fall  Risk  09/26/2021 08/16/2020 07/28/2019 07/25/2018 01/15/2017  Falls in the past year? 1 0 0 No Yes  Number falls in past yr: 1 0 0 - 1  Injury with Fall? 1 0 0 - Yes  Risk for fall due to : History of fall(s) No Fall Risks - - -  Follow up Education provided Falls evaluation completed Falls evaluation completed - -   Advanced Directives A voluntary discussion about advance care planning including the explanation and discussion of advance directives was extensively discussed  with the patient for 15 minutes with patient.  Explanation about the health care proxy and Living will was reviewed and packet with forms with explanation of how to fill them out was given.  During this discussion, the patient was able to identify a health care proxy as his sister and plans to fill out the paperwork required.  Patient was offered a separate [place] visit for further assistance with forms.     Past Medical History:  Past Medical History:  Diagnosis Date   Allergy    Concussion    x3   Dysplastic nevus 09/07/2019   L mid back paraspinal - moderate   Dysplastic nevus 09/07/2019   L low back 4.5 cm lat to spine - moderate   Dysplastic nevus 06/16/2020   R pectoral lat to areola - moderate   Dysplastic nevus 06/16/2020   R mid lat bicep - moderate   Dysplastic nevus 12/12/2020   L mid ant thigh - moderate   Iron deficiency anemia    Wears contact lenses     Surgical History:  Past Surgical History:  Procedure Laterality Date   ANKLE ARTHROSCOPY Left 04/2017   COLONOSCOPY WITH PROPOFOL N/A 01/07/2019   Procedure: COLONOSCOPY WITH PROPOFOL;  Surgeon: Lin Landsman, MD;  Location: [place];  Service: Endoscopy;  Laterality: N/A;   ESOPHAGOGASTRODUODENOSCOPY (EGD) WITH PROPOFOL N/A 01/07/2019   Procedure: ESOPHAGOGASTRODUODENOSCOPY (EGD) WITH BIOPSIES;  Surgeon: Lin Landsman, MD;  Location: [facility];  Service: Endoscopy;  Laterality: N/A;   MENISCUS REPAIR   2002   left     Medications:  Current Outpatient Medications on File Prior to Visit  Medication Sig   albuterol (VENTOLIN HFA) 108 (90 Base) MCG/ACT inhaler Inhale 2 puffs into the lungs every 6 (six) hours as needed for wheezing or shortness of breath.   amoxicillin-clavulanate (AUGMENTIN) 875-125 MG tablet Take 1 tablet by mouth 2 (two) times daily for 10 days.   predniSONE (DELTASONE) 10 MG tablet Take 1 tablet (10 mg total) by mouth daily with breakfast. Take 6 today, 5 tomorrow and decrease by 1 each day until course is complete.   No current facility-administered medications on file prior to visit.    Allergies:  No Known Allergies  Social History:  Social History   Socioeconomic History   Marital status: Single    Spouse name: Not on file   Number of children: Not on  file   Years of education: Not on file   Highest education level: Not on file  Occupational History   Not on file  Tobacco Use   Smoking status: Former   Smokeless tobacco: Former    Types: Chew    Quit date: 2010   Tobacco comments:    smoked socially in [employer] fundraiser Use: Never used  Substance and Sexual Activity   Alcohol use: No    Comment: pt states he has not drank in the last 5 months    Drug use: No    Comment: pt states he has been clean for the last 5 months- previous marijuana use    Sexual activity: Not Currently  Other Topics Concern   Not on file  Social History Narrative   Not on file   Social Determinants of Health   Financial Resource Strain: Low Risk    Difficulty of Paying Living Expenses: Not hard at all  Food Insecurity: No Food Insecurity   Worried About Charity fundraiser in the Last Year: Never true   [place] in the Last Year: Never true  Transportation Needs: No Transportation Needs   Lack of Transportation (Medical): No   Lack of Transportation (Non-Medical): No  Physical Activity: Insufficiently Active   Days of Exercise per Week: 3 days    Minutes of Exercise per Session: 30 min  Stress: Stress Concern Present   Feeling of Stress : To some extent  Social Connections: Socially Isolated   Frequency of Communication with Friends and Family: More than three times a week   Frequency of Social Gatherings with Friends and Family: More than three times a week   Attends Religious Services: Never   Marine scientist or Organizations: No   Attends Music therapist: Never   Marital Status: Never married  Human resources officer Violence: Not At Risk   Fear of Current or Ex-Partner: No   Emotionally Abused: No   Physically Abused: No   Sexually Abused: No   Social History   Tobacco Use  Smoking Status Former  Smokeless Tobacco Former   Types: Chew   Quit date: 2010  Tobacco Comments   smoked socially in college   Social History   Substance and Sexual Activity  Alcohol Use No   Comment: pt states he has not drank in the last 5 months     Family History:  Family History  Problem Relation Age of Onset   Cancer Mother        skin   Alzheimer's disease Maternal Grandfather     Past medical history, surgical history, medications, allergies, family history and social history reviewed with patient today and changes made to appropriate areas of the chart.   Review of Systems - negative All other ROS negative except what is listed above and in the HPI.      Objective:    BP 119/81   Pulse 85   Temp 98.2 F (36.8 C)   Ht 5\' 10"  (1.778 m)   Wt 210 lb 12.8 oz (95.6 kg)   SpO2 97%   BMI 30.25 kg/m   Wt Readings from Last 3 Encounters:  09/26/21 210 lb 12.8 oz (95.6 kg)  09/21/21 197 lb 9.6 oz (89.6 kg)  02/20/21 201 lb 3.2 oz (91.3 kg)    Physical Exam Vitals and nursing note reviewed.  Constitutional:      General: He is awake. He is not  in acute distress.    Appearance: He is well-developed. He is not ill-appearing.  HENT:     Head: Normocephalic and atraumatic.     Right Ear: Hearing,  tympanic membrane, ear canal and external ear normal. No drainage.     Left Ear: Hearing, tympanic membrane, ear canal and external ear normal. No drainage.     Ears:      Nose: Nose normal.     Mouth/Throat:     Mouth: Mucous membranes are moist.     Pharynx: Uvula midline.  Eyes:     General: Lids are normal.        Right eye: No discharge.        Left eye: No discharge.     Extraocular Movements: Extraocular movements intact.     Conjunctiva/sclera: Conjunctivae normal.     Pupils: Pupils are equal, round, and reactive to light.     Visual Fields: Right eye visual fields normal and left eye visual fields normal.  Neck:     Thyroid: No thyromegaly.     Vascular: No carotid bruit.  Cardiovascular:     Rate and Rhythm: Normal rate and regular rhythm.     Heart sounds: Normal heart sounds, S1 normal and S2 normal. No murmur heard.   No gallop.  Pulmonary:     Effort: Pulmonary effort is normal. No accessory muscle usage or respiratory distress.     Breath sounds: Normal breath sounds.  Abdominal:     General: Bowel sounds are normal.     Palpations: Abdomen is soft. There is no hepatomegaly or splenomegaly.     Tenderness: There is no abdominal tenderness.  Genitourinary:    Comments: Deferred per patient request today. Musculoskeletal:        General: Normal range of motion.     Cervical back: Normal range of motion and neck supple.     Right lower leg: No edema.     Left lower leg: No edema.  Lymphadenopathy:     Head:     Right side of head: No submental, submandibular, tonsillar, preauricular or posterior auricular adenopathy.     Left side of head: No submental, submandibular, tonsillar, preauricular or posterior auricular adenopathy.     Cervical: No cervical adenopathy.  Skin:    General: Skin is warm and dry.     Capillary Refill: Capillary refill takes less than 2 seconds.     Findings: No rash.  Neurological:     Mental Status: He is alert and oriented to  person, place, and time.     Gait: Gait is intact.     Deep Tendon Reflexes: Reflexes are normal and symmetric.  Psychiatric:        Mood and Affect: Mood normal.        Behavior: Behavior normal. Behavior is cooperative.        Thought Content: Thought content normal.        Judgment: Judgment normal.   Results for orders placed or performed in visit on 09/21/21  Novel Coronavirus, NAA ([facility])   Specimen: Nasopharyngeal(NP) swabs in vial transport medium  Result Value Ref Range   SARS-CoV-2, NAA Not Detected Not Detected  Veritor Flu A/B Waived  Result Value Ref Range   Influenza A Negative Negative   Influenza B Negative Negative      Assessment & Plan:   Problem List Items Addressed This Visit       Other   Elevated ALT measurement  Recheck today, recommend complete cessation alcohol use.  Consider imaging if higher elevations noted.      Relevant Orders   Comprehensive metabolic panel   Elevated glucose    Noted on past labs -- A1c 4.9% today, discussed with patient no diabetes or prediabetes.      Relevant Orders   Bayer DCA Hb A1c Waived   History of alcoholism ([place]) - Primary    History of heavier use, sober for 2 years.  Currently has returned to alcohol use and would like to try Naltrexone.  Will send in script for 50 MG daily and adjust as needed.  Recommend complete cessation alcohol use.  Labs: B12, ferritin, CBC, CMP today.      Relevant Orders   Comprehensive metabolic panel   TSH   Iron deficiency anemia    History of low levels -- recheck CBC, iron, ferritin, and B12 today.      Relevant Orders   CBC with Differential/Platelet   Iron, TIBC and Ferritin Panel   Vitamin B12   Tinnitus, left ear    Ongoing for weeks -- ?polyp to left ear vs related to alcohol use.  Referral to ENT placed.      Relevant Orders   Ambulatory referral to ENT   Other Visit Diagnoses     Low serum testosterone       Will recheck on first thing in morning labs  next week.   Relevant Orders   Testosterone, free, total([facility])   Encounter for lipid screening for cardiovascular disease       Lipid panel today on labs.   Relevant Orders   Lipid Panel w/o Chol/HDL Ratio   Encounter for annual physical exam       Annual physical today with labs and health maintenance reviewed.        Discussed aspirin prophylaxis for myocardial infarction prevention and decision was it was not indicated  LABORATORY TESTING:  Health maintenance labs ordered today as discussed above.   IMMUNIZATIONS:   - Tdap: Tetanus vaccination status reviewed: last tetanus booster within 10 years. - Influenza: Refuses -- gets sick with this - Pneumovax: Not applicable - Prevnar: Not applicable - Zostavax vaccine: Not applicable - Covid = x 2 vaccinations, refuses further  SCREENING: - Colonoscopy: Not applicable Discussed with patient purpose of the colonoscopy is to detect colon cancer at curable precancerous or early stages   - AAA Screening: Not applicable  -Hearing Test: Not applicable  -Spirometry: Not applicable   PATIENT COUNSELING:    Sexuality: Discussed sexually transmitted diseases, partner selection, use of condoms, avoidance of unintended pregnancy  and contraceptive alternatives.   Advised to avoid cigarette smoking.  I discussed with the patient that most people either abstain from alcohol or drink within safe limits (<=14/week and <=4 drinks/occasion for males, <=7/weeks and <= 3 drinks/occasion for females) and that the risk for alcohol disorders and other health effects rises proportionally with the number of drinks per week and how often a drinker exceeds daily limits.  Discussed cessation/primary prevention of drug use and availability of treatment for abuse.   Diet: Encouraged to adjust caloric intake to maintain  or achieve ideal body weight, to reduce intake of dietary saturated fat and total fat, to limit sodium intake by avoiding  high sodium foods and not adding table salt, and to maintain adequate dietary potassium and calcium preferably from fresh fruits, vegetables, and low-fat dairy products.    Stressed the importance of regular exercise  Injury  prevention: Discussed safety belts, safety helmets, smoke detector, smoking near bedding or upholstery.   Dental health: Discussed importance of regular tooth brushing, flossing, and dental visits.   Follow up plan: NEXT PREVENTATIVE PHYSICAL DUE IN 1 YEAR. Return in about 6 weeks (around 11/07/2021) for Alcohol Abuse.

## 2021-09-27 ENCOUNTER — Other Ambulatory Visit: Payer: Self-pay | Admitting: Nurse Practitioner

## 2021-09-27 DIAGNOSIS — D729 Disorder of white blood cells, unspecified: Secondary | ICD-10-CM

## 2021-09-27 LAB — CBC WITH DIFFERENTIAL/PLATELET
Basophils Absolute: 0 10*3/uL (ref 0.0–0.2)
Basos: 0 %
EOS (ABSOLUTE): 0 10*3/uL (ref 0.0–0.4)
Eos: 0 %
Hematocrit: 45.9 % (ref 37.5–51.0)
Hemoglobin: 15.4 g/dL (ref 13.0–17.7)
Immature Grans (Abs): 0.1 10*3/uL (ref 0.0–0.1)
Immature Granulocytes: 1 %
Lymphocytes Absolute: 2.2 10*3/uL (ref 0.7–3.1)
Lymphs: 16 %
MCH: 31.2 pg (ref 26.6–33.0)
MCHC: 33.6 g/dL (ref 31.5–35.7)
MCV: 93 fL (ref 79–97)
Monocytes Absolute: 0.6 10*3/uL (ref 0.1–0.9)
Monocytes: 5 %
Neutrophils Absolute: 10.6 10*3/uL — ABNORMAL HIGH (ref 1.4–7.0)
Neutrophils: 78 %
Platelets: 414 10*3/uL (ref 150–450)
RBC: 4.93 x10E6/uL (ref 4.14–5.80)
RDW: 12.4 % (ref 11.6–15.4)
WBC: 13.6 10*3/uL — ABNORMAL HIGH (ref 3.4–10.8)

## 2021-09-27 LAB — COMPREHENSIVE METABOLIC PANEL
ALT: 28 IU/L (ref 0–44)
AST: 17 IU/L (ref 0–40)
Albumin/Globulin Ratio: 2.3 — ABNORMAL HIGH (ref 1.2–2.2)
Albumin: 4.9 g/dL (ref 4.0–5.0)
Alkaline Phosphatase: 70 IU/L (ref 44–121)
BUN/Creatinine Ratio: 18 (ref 9–20)
BUN: 17 mg/dL (ref 6–24)
Bilirubin Total: 0.4 mg/dL (ref 0.0–1.2)
CO2: 23 mmol/L (ref 20–29)
Calcium: 9.3 mg/dL (ref 8.7–10.2)
Chloride: 100 mmol/L (ref 96–106)
Creatinine, Ser: 0.94 mg/dL (ref 0.76–1.27)
Globulin, Total: 2.1 g/dL (ref 1.5–4.5)
Glucose: 86 mg/dL (ref 70–99)
Potassium: 4.5 mmol/L (ref 3.5–5.2)
Sodium: 138 mmol/L (ref 134–144)
Total Protein: 7 g/dL (ref 6.0–8.5)
eGFR: 104 mL/min/{1.73_m2} (ref >59)

## 2021-09-27 LAB — LIPID PANEL W/O CHOL/HDL RATIO
Cholesterol, Total: 189 mg/dL (ref 100–199)
HDL: 42 mg/dL (ref >39)
LDL Chol Calc (NIH): 89 mg/dL (ref 0–99)
Triglycerides: 353 mg/dL — ABNORMAL HIGH (ref 0–149)
VLDL Cholesterol Cal: 58 mg/dL — ABNORMAL HIGH (ref 5–40)

## 2021-09-27 LAB — IRON,TIBC AND FERRITIN PANEL
Ferritin: 362 ng/mL (ref 30–400)
Iron Saturation: 24 % (ref 15–55)
Iron: 75 ug/dL (ref 38–169)
Total Iron Binding Capacity: 312 ug/dL (ref 250–450)
UIBC: 237 ug/dL (ref 111–343)

## 2021-09-27 LAB — VITAMIN B12: Vitamin B-12: 644 pg/mL (ref 232–1245)

## 2021-09-27 LAB — TSH: TSH: 0.813 u[IU]/mL (ref 0.450–4.500)

## 2021-09-27 NOTE — Progress Notes (Signed)
Contacted via MyChart   Good afternoon Eric Kline, your labs have returned.  Overall everything is stable with exception of mild elevation in white blood cell count and neutrophils.  I will add to recheck this when you come and get your testosterone checked.  Have you been ill recently?  Sometimes we see this with illness or recent vaccines.  Any questions? Keep being amazing!!  Thank you for allowing me to participate in your care.  I appreciate you. Kindest regards, Khelani Kops

## 2021-10-04 ENCOUNTER — Ambulatory Visit: Payer: Federal, State, Local not specified - PPO | Admitting: Dermatology

## 2021-10-05 ENCOUNTER — Other Ambulatory Visit: Payer: Self-pay

## 2021-10-05 ENCOUNTER — Other Ambulatory Visit (INDEPENDENT_AMBULATORY_CARE_PROVIDER_SITE_OTHER): Payer: Federal, State, Local not specified - PPO

## 2021-10-05 DIAGNOSIS — D729 Disorder of white blood cells, unspecified: Secondary | ICD-10-CM | POA: Diagnosis not present

## 2021-10-05 DIAGNOSIS — R7989 Other specified abnormal findings of blood chemistry: Secondary | ICD-10-CM

## 2021-10-06 LAB — CBC WITH DIFFERENTIAL/PLATELET
Basophils Absolute: 0 10*3/uL (ref 0.0–0.2)
Basos: 1 %
EOS (ABSOLUTE): 0.2 10*3/uL (ref 0.0–0.4)
Eos: 3 %
Hematocrit: 44.8 % (ref 37.5–51.0)
Hemoglobin: 15 g/dL (ref 13.0–17.7)
Immature Grans (Abs): 0 10*3/uL (ref 0.0–0.1)
Immature Granulocytes: 0 %
Lymphocytes Absolute: 2.6 10*3/uL (ref 0.7–3.1)
Lymphs: 32 %
MCH: 31.2 pg (ref 26.6–33.0)
MCHC: 33.5 g/dL (ref 31.5–35.7)
MCV: 93 fL (ref 79–97)
Monocytes Absolute: 0.5 10*3/uL (ref 0.1–0.9)
Monocytes: 7 %
Neutrophils Absolute: 4.5 10*3/uL (ref 1.4–7.0)
Neutrophils: 57 %
Platelets: 271 10*3/uL (ref 150–450)
RBC: 4.81 x10E6/uL (ref 4.14–5.80)
RDW: 12.4 % (ref 11.6–15.4)
WBC: 7.9 10*3/uL (ref 3.4–10.8)

## 2021-10-06 NOTE — Progress Notes (Signed)
Contacted via MyChart   Good afternoon Eric Kline, your labs have returned and white blood cell count is now normal, as are neutrophils.  Testosterone levels in normal range, lower normal but normal.  Waiting on free testosterone.  Any questions? Keep being amazing!!  Thank you for allowing me to participate in your care.  I appreciate you. Kindest regards, Violett Hobbs

## 2021-10-07 LAB — TESTOSTERONE, FREE, TOTAL, SHBG
Sex Hormone Binding: 32 nmol/L (ref 16.5–55.9)
Testosterone, Free: 14 pg/mL (ref 6.8–21.5)
Testosterone: 388 ng/dL (ref 264–916)

## 2021-11-05 NOTE — Patient Instructions (Signed)
Alcohol Abuse and Dependence Information, Adult Alcohol is a widely available drug. People drink alcohol in different amounts. People who drink alcohol very often and in large amounts often have problems during and after drinking. They may develop what is called an alcohol use disorder. There are two main types of alcohol use disorders: Alcohol abuse. This is when you use alcohol too much or too often. You may use alcohol to make yourself feel happy or to reduce stress. You may have a hard time setting a limit on the amount you drink. Alcohol dependence. This is when you use alcohol consistently for a period of time, and your body changes as a result. This can make it hard to stop drinking because you may start to feel sick or feel different when you do not use alcohol. These symptoms are known as withdrawal. How can alcohol abuse and dependence affect me? Alcohol abuse and dependence can have a negative effect on your life. Drinking too much can lead to addiction. You may feel like you need alcohol to function normally. You may drink alcohol before work in the morning, during the day, or as soon as you get home from work in the evening. These actions can result in: Poor work performance. Job loss. Financial problems. Car crashes or criminal charges from driving after drinking alcohol. Problems in your relationships with friends and family. Losing the trust and respect of coworkers, friends, and family. Drinking heavily over a long period of time can permanently damage your body and brain, and can cause lifelong health issues, such as: Damage to your liver or pancreas. Heart problems, high blood pressure, or stroke. Certain cancers. Decreased ability to fight infections. Brain or nerve damage. Depression. Early (premature) death. If you are careless or you crave alcohol, it is easy to drink more than your body can handle (overdose). Alcohol overdose is a serious situation that requires  hospitalization. It may lead to permanent injuries or death. What can increase my risk? Having a family history of alcohol abuse. Having depression or other mental health conditions. Beginning to drink at an early age. Binge drinking often. Experiencing trauma, stress, and an unstable home life during childhood. Spending time with people who drink often. What actions can I take to prevent or manage alcohol abuse and dependence? Do not drink alcohol if: Your health care provider tells you not to drink. You are pregnant, may be pregnant, or are planning to become pregnant. If you drink alcohol: Limit how much you use to: 0-1 drink a day for women. 0-2 drinks a day for men. Be aware of how much alcohol is in your drink. In the [place] one drink equals one 12 oz bottle of beer (355 mL), one 5 oz glass of wine (148 mL), or one 1 oz glass of hard liquor (44 mL). Stop drinking if you have been drinking too much. This can be very hard to do if you are used to abusing alcohol. If you begin to have withdrawal symptoms, talk with your health care provider or a person that you trust. These symptoms may include anxiety, shaky hands, headache, nausea, sweating, or not being able to sleep. Choose to drink nonalcoholic beverages in social gatherings and places where there may be alcohol. Activity Spend more time on activities that you enjoy that do not involve alcohol, like hobbies or exercise. Find healthy ways to cope with stress, such as exercise, meditation, or spending time with people you care about. General information Talk to your family,   coworkers, and friends about supporting you in your efforts to stop drinking. If they drink, ask them not to drink around you. Spend more time with people who do not drink alcohol. If you think that you have an alcohol dependency problem: Tell friends or family about your concerns. Talk with your health care provider or another health professional about where to  get help. Work with a therapist and a chemical dependency counselor. Consider joining a support group for people who struggle with alcohol abuse and dependence. Where to find support  Your health care provider. SMART Recovery: www.smartrecovery.org Therapy and support groups Local treatment centers or chemical dependency counselors. Local AA groups in your community: www.aa.org Where to find more information Centers for Disease Control and Prevention: www.cdc.gov National Institute on Alcohol Abuse and Alcoholism: www.niaaa.nih.gov Alcoholics Anonymous (AA): www.aa.org Contact a health care provider if: You drank more or for longer than you intended on more than one occasion. You tried to stop drinking or to cut back on how much you drink, but you were not able to. You often drink to the point of vomiting or passing out. You want to drink so badly that you cannot think about anything else. You have problems in your life due to drinking, but you continue to drink. You keep drinking even though you feel anxious, depressed, or have experienced memory loss. You have stopped doing the things you used to enjoy in order to drink. You have to drink more than you used to in order to get the effect you want. You experience anxiety, sweating, nausea, shakiness, and trouble sleeping when you try to stop drinking. Get help right away if: You have thoughts about hurting yourself or others. You have serious withdrawal symptoms, including: Confusion. Racing heart. High blood pressure. Fever. If you ever feel like you may hurt yourself or others, or have thoughts about taking your own life, get help right away. You can go to your nearest emergency department or call: Your local emergency services (911 in the [place] A suicide crisis helpline, such as the National Suicide Prevention Lifeline at 1-800-273-8255 or 988 in the [place] This is open 24 hours a day. Summary Alcohol abuse and dependence can  have a negative effect on your life. Drinking too much or too often can lead to addiction. If you drink alcohol, limit how much you use. If you are having trouble keeping your drinking under control, find ways to change your behavior. Hobbies, calming activities, exercise, or support groups can help. If you feel you need help with changing your drinking habits, talk with your health care provider, a good friend, or a therapist, or go to an AA group. This information is not intended to replace advice given to you by your health care provider. Make sure you discuss any questions you have with your health care provider. Document Revised: 05/03/2021 Document Reviewed: 12/16/2018 Elsevier Patient Education  2022 [employer] Inc.  

## 2021-11-08 ENCOUNTER — Other Ambulatory Visit: Payer: Self-pay

## 2021-11-08 ENCOUNTER — Encounter: Payer: Self-pay | Admitting: Nurse Practitioner

## 2021-11-08 ENCOUNTER — Ambulatory Visit: Payer: Federal, State, Local not specified - PPO | Admitting: Nurse Practitioner

## 2021-11-08 VITALS — BP 125/80 | HR 89 | Temp 99.1°F | Ht 70.0 in | Wt 212.4 lb

## 2021-11-08 DIAGNOSIS — M25562 Pain in left knee: Secondary | ICD-10-CM | POA: Diagnosis not present

## 2021-11-08 DIAGNOSIS — F1021 Alcohol dependence, in remission: Secondary | ICD-10-CM | POA: Diagnosis not present

## 2021-11-08 DIAGNOSIS — G8929 Other chronic pain: Secondary | ICD-10-CM | POA: Diagnosis not present

## 2021-11-08 NOTE — Progress Notes (Signed)
BP 125/80    Pulse 89    Temp 99.1 F (37.3 C)    Ht 5\' 10"  (1.778 m)    Wt 212 lb 6.4 oz (96.3 kg)    SpO2 97%    BMI 30.48 kg/m    Subjective:    Patient ID: Eric Kline, male    DOB: [birth year], 41 y.o.   MRN: [id number]  HPI: Eric Kline is a 41 y.o. male  Chief Complaint  Patient presents with   Alcohol Problem    Patient states he has not had a drink in today marks a month. Patient states the medication that was prescribed at his last visit, he has since stopped due to making him feel sick to his stomach. Patient states he just says he feels better when he does not indulge in drinking.    Knee Pain    Patient states he is currently experiencing knee pain and he has started to noticed interference with his sleeping at night and he states he tries CBD gummies to help. Patient states he noticed the discomfort more since he has been sober. Patient noticed with a lot of movement he has more pain and discomfort.    ALCOHOL ABUSE Started Naltrexone 09/26/21, but this did agree with him -- had nausea with this.  He stopped this and stopped alcohol intake on own.  Today is a month without alcohol intake.  Prior to return to drinking recently he had been sober for 2 years.  Has not gone to [employer] in 10 months.  AUDIT score is 0 today and previous was 9.   Depressed mood: no Anxious mood: no Anhedonia: no Significant weight loss or gain: no Insomnia: yes hard to fall asleep due to knee pain, uses CBD with Melatonin Fatigue: no Feelings of worthlessness or guilt: no Impaired concentration/indecisiveness: no Suicidal ideations: no Hopelessness: no Crying spells: no Depression screen [facility] 2/9 11/08/2021 09/26/2021 08/16/2020 07/28/2019 07/25/2018  Decreased Interest 1 1 0 0 0  Down, Depressed, Hopeless 0 1 0 0 0  PHQ - 2 Score 1 2 0 0 0  Altered sleeping 2 2 - - 1  Tired, decreased energy 1 3 - - 0  Change in appetite 2 1 - - 0  Feeling bad or failure about yourself  0 1 - - 0   Trouble concentrating 1 1 - - 0  Moving slowly or fidgety/restless 0 1 - - 0  Suicidal thoughts 0 0 - - 0  PHQ-9 Score 7 11 - - 1  Difficult doing work/chores Somewhat difficult Somewhat difficult - - -    KNEE PAIN Has seen [facility] in past for this -- had imaging including MRI and did PT in past.  Pain is all on left side -- left ankle, knee, and shoulder.  History of knee surgery years ago. Duration: chronic Involved knee: left Mechanism of injury: unknown Location:medial Onset: gradual Severity: 3/10  Quality:  dull and aching Frequency: intermittent Radiation: no Aggravating factors: weight bearing, walking, and movement  Alleviating factors: NSAIDs and rest  Status: fluctuating Treatments attempted: ibuprofen  Relief with NSAIDs?:  moderate Weakness with weight bearing or walking: no Sensation of giving way: no Locking: no Popping: no Bruising: no Swelling: occasional Redness: no Paresthesias/decreased sensation: no Fevers: no   Relevant past medical, surgical, family and social history reviewed and updated as indicated. Interim medical history since our last visit reviewed. Allergies and medications reviewed and updated.  Review of Systems  Constitutional:  Negative for activity change, diaphoresis, fatigue and fever.  Respiratory:  Negative for cough, chest tightness, shortness of breath and wheezing.   Cardiovascular:  Negative for chest pain, palpitations and leg swelling.  Gastrointestinal: Negative.   Musculoskeletal:  Positive for arthralgias.  Neurological: Negative.   Psychiatric/Behavioral: Negative.     Per HPI unless specifically indicated above     Objective:    BP 125/80    Pulse 89    Temp 99.1 F (37.3 C)    Ht 5\' 10"  (1.778 m)    Wt 212 lb 6.4 oz (96.3 kg)    SpO2 97%    BMI 30.48 kg/m   Wt Readings from Last 3 Encounters:  11/08/21 212 lb 6.4 oz (96.3 kg)  09/26/21 210 lb 12.8 oz (95.6 kg)  09/21/21 197 lb 9.6 oz (89.6 kg)     Physical Exam Vitals and nursing note reviewed.  Constitutional:      General: He is awake. He is not in acute distress.    Appearance: He is well-developed and well-groomed. He is not ill-appearing.  HENT:     Head: Normocephalic and atraumatic.     Right Ear: Hearing normal. No drainage.     Left Ear: Hearing normal. No drainage.  Eyes:     General: Lids are normal.        Right eye: No discharge.        Left eye: No discharge.     Conjunctiva/sclera: Conjunctivae normal.     Pupils: Pupils are equal, round, and reactive to light.  Neck:     Vascular: No carotid bruit.  Cardiovascular:     Rate and Rhythm: Normal rate and regular rhythm.     Heart sounds: Normal heart sounds, S1 normal and S2 normal. No murmur heard.   No gallop.  Pulmonary:     Effort: Pulmonary effort is normal. No accessory muscle usage or respiratory distress.     Breath sounds: Normal breath sounds.  Abdominal:     General: Bowel sounds are normal.     Palpations: Abdomen is soft.  Musculoskeletal:        General: Normal range of motion.     Cervical back: Normal range of motion and neck supple.     Right knee: Normal.     Left knee: Crepitus present. No swelling, erythema, lacerations or bony tenderness. Normal range of motion. Tenderness present over the medial joint line.     Right lower leg: No edema.     Left lower leg: No edema.  Skin:    General: Skin is warm and dry.     Capillary Refill: Capillary refill takes less than 2 seconds.  Neurological:     Mental Status: He is alert and oriented to person, place, and time.  Psychiatric:        Attention and Perception: Attention normal.        Mood and Affect: Mood normal.        Speech: Speech normal.        Behavior: Behavior normal. Behavior is cooperative.        Thought Content: Thought content normal.    Results for orders placed or performed in visit on 10/05/21  CBC with Differential/Platelet  Result Value Ref Range   WBC 7.9 3.4 -  10.8 x10E3/uL   RBC 4.81 4.14 - 5.80 x10E6/uL   Hemoglobin 15.0 13.0 - 17.7 g/dL   Hematocrit 44.8 37.5 - 51.0 %   MCV 93 79 -  97 fL   MCH 31.2 26.6 - 33.0 pg   MCHC 33.5 31.5 - 35.7 g/dL   RDW 12.4 11.6 - 15.4 %   Platelets 271 150 - 450 x10E3/uL   Neutrophils 57 Not Estab. %   Lymphs 32 Not Estab. %   Monocytes 7 Not Estab. %   Eos 3 Not Estab. %   Basos 1 Not Estab. %   Neutrophils Absolute 4.5 1.4 - 7.0 x10E3/uL   Lymphocytes Absolute 2.6 0.7 - 3.1 x10E3/uL   Monocytes Absolute 0.5 0.1 - 0.9 x10E3/uL   EOS (ABSOLUTE) 0.2 0.0 - 0.4 x10E3/uL   Basophils Absolute 0.0 0.0 - 0.2 x10E3/uL   Immature Granulocytes 0 Not Estab. %   Immature Grans (Abs) 0.0 0.0 - 0.1 x10E3/uL  Testosterone, free, total([facility])  Result Value Ref Range   Testosterone 388 264 - 916 ng/dL   Testosterone, Free 14.0 6.8 - 21.5 pg/mL   Sex Hormone Binding 32.0 16.5 - 55.9 nmol/L      Assessment & Plan:   Problem List Items Addressed This Visit       Other   History of alcoholism ([place]) - Primary    Currently has been sober for 30 days.  Recommend continued cessation.  Is not taking Naltrexone - did not tolerate.  Praised for cessation and recommend attend AA meetings + continued cessation.      Left knee pain    Chronic issue, recommend continue at home therapy + trial Voltaren gel at night and Tylenol.  Wear knee support consistently and practice PT exercises.  Return to ortho as needed.        Follow up plan: Return in about 11 months (around 09/27/2022) for Annual physical.

## 2021-11-08 NOTE — Assessment & Plan Note (Signed)
Currently has been sober for 30 days.  Recommend continued cessation.  Is not taking Naltrexone - did not tolerate.  Praised for cessation and recommend attend AA meetings + continued cessation.

## 2021-11-08 NOTE — Assessment & Plan Note (Signed)
Chronic issue, recommend continue at home therapy + trial Voltaren gel at night and Tylenol.  Wear knee support consistently and practice PT exercises.  Return to ortho as needed.

## 2021-11-10 ENCOUNTER — Encounter: Payer: Self-pay | Admitting: Nurse Practitioner

## 2021-11-10 MED ORDER — CYCLOBENZAPRINE HCL 10 MG PO TABS: 10.0000 mg | ORAL_TABLET | Freq: Three times a day (TID) | ORAL | 0 refills | Status: DC | PRN

## 2021-12-15 DIAGNOSIS — H9312 Tinnitus, left ear: Secondary | ICD-10-CM | POA: Diagnosis not present

## 2021-12-15 DIAGNOSIS — J301 Allergic rhinitis due to pollen: Secondary | ICD-10-CM | POA: Diagnosis not present

## 2022-01-10 ENCOUNTER — Encounter: Payer: Self-pay | Admitting: Dermatology

## 2022-01-10 ENCOUNTER — Ambulatory Visit: Payer: Federal, State, Local not specified - PPO | Admitting: Dermatology

## 2022-01-10 ENCOUNTER — Other Ambulatory Visit: Payer: Self-pay

## 2022-01-10 ENCOUNTER — Other Ambulatory Visit: Payer: Self-pay | Admitting: Dermatology

## 2022-01-10 DIAGNOSIS — L578 Other skin changes due to chronic exposure to nonionizing radiation: Secondary | ICD-10-CM | POA: Diagnosis not present

## 2022-01-10 DIAGNOSIS — Z1283 Encounter for screening for malignant neoplasm of skin: Secondary | ICD-10-CM

## 2022-01-10 DIAGNOSIS — Z86018 Personal history of other benign neoplasm: Secondary | ICD-10-CM

## 2022-01-10 DIAGNOSIS — D225 Melanocytic nevi of trunk: Secondary | ICD-10-CM

## 2022-01-10 DIAGNOSIS — D1800 Hemangioma unspecified site: Secondary | ICD-10-CM

## 2022-01-10 DIAGNOSIS — D229 Melanocytic nevi, unspecified: Secondary | ICD-10-CM | POA: Diagnosis not present

## 2022-01-10 DIAGNOSIS — D1722 Benign lipomatous neoplasm of skin and subcutaneous tissue of left arm: Secondary | ICD-10-CM

## 2022-01-10 DIAGNOSIS — L820 Inflamed seborrheic keratosis: Secondary | ICD-10-CM

## 2022-01-10 DIAGNOSIS — D492 Neoplasm of unspecified behavior of bone, soft tissue, and skin: Secondary | ICD-10-CM

## 2022-01-10 DIAGNOSIS — L814 Other melanin hyperpigmentation: Secondary | ICD-10-CM

## 2022-01-10 DIAGNOSIS — L821 Other seborrheic keratosis: Secondary | ICD-10-CM

## 2022-01-10 NOTE — Progress Notes (Signed)
? ?Follow-Up Visit ?  ?Subjective  ?Eric Kline is a 41 y.o. male who presents for the following: Annual Exam. The patient presents for Total-Body Skin Exam (TBSE) for skin cancer screening and mole check.  The patient has spots, moles and lesions to be evaluated, some may be new or changing and the patient has concerns that these could be cancer.  ? ?The following portions of the chart were reviewed this encounter and updated as appropriate:  ? Tobacco  Allergies  Meds  Problems  Med Hx  Surg Hx  Fam Hx   ?  ?Review of Systems:  No other skin or systemic complaints except as noted in HPI or Assessment and Plan. ? ?Objective  ?Well appearing patient in no apparent distress; mood and affect are within normal limits. ? ?A full examination was performed including scalp, head, eyes, ears, nose, lips, neck, chest, axillae, abdomen, back, buttocks, bilateral upper extremities, bilateral lower extremities, hands, feet, fingers, toes, fingernails, and toenails. All findings within normal limits unless otherwise noted below. ? ?right dorsum hand x 1 ?Stuck-on, waxy, tan-brown papule or plaque --Discussed benign etiology and prognosis.  ? ?right sup medial buttock ?0.4 cm med brown macule  ? ? ? ? ? ? ?left medial distal tricep ?2.2 cm Subcutaneous nodule.  ? ? ?Assessment & Plan  ?Inflamed seborrheic keratosis ?right dorsum hand x 1 ?Irritated and changing ?Reassured benign age-related growth.  Recommend observation.  Discussed cryotherapy if spot(s) become irritated or inflamed.  ? ?Destruction of lesion - right dorsum hand x 1 ?Complexity: simple   ?Destruction method: cryotherapy   ?Informed consent: discussed and consent obtained   ?Timeout:  patient name, date of birth, surgical site, and procedure verified ?Lesion destroyed using liquid nitrogen: Yes   ?Region frozen until ice ball extended beyond lesion: Yes   ?Outcome: patient tolerated procedure well with no complications   ?Post-procedure details: wound care  instructions given   ? ?Neoplasm of skin ?right sup medial buttock ?Epidermal / dermal shaving ? ?Lesion diameter (cm):  0.4 ?Informed consent: discussed and consent obtained   ?Timeout: patient name, date of birth, surgical site, and procedure verified   ?Patient was prepped and draped in usual sterile fashion: area prepped with alcohol. ?Anesthesia: the lesion was anesthetized in a standard fashion   ?Anesthetic:  1% lidocaine w/ epinephrine 1-100,000 local infiltration ?Instrument used: flexible razor blade   ?Hemostasis achieved with: pressure, aluminum chloride and electrodesiccation   ?Outcome: patient tolerated procedure well   ?Post-procedure details: wound care instructions given   ?Post-procedure details comment:  Ointment and a small bandage applied ? ?Specimen 1 - Surgical pathology ?Differential Diagnosis: R/O Dysplastic nevus  ?Check Margins: No ? ?Lipoma of left upper extremity - symptomatic ?left medial distal tricep ?Benign-appearing. Exam most consistent with an Lipoma. Discussed that a Lipoma is a benign growth that can grow over time and sometimes get irritated or inflamed. Recommend observation if it is not bothersome. Discussed option of surgical excision to remove it if it is growing, symptomatic, or other changes noted. Please call for new or changing lesions so they can be evaluated. ?  ?Skin cancer screening ? ?Lentigines ?- Scattered tan macules ?- Due to sun exposure ?- Benign-appearing, observe ?- Recommend daily broad spectrum sunscreen SPF 30+ to sun-exposed areas, reapply every 2 hours as needed. ?- Call for any changes ? ?Seborrheic Keratoses ?- Stuck-on, waxy, tan-brown papules and/or plaques  ?- Benign-appearing ?- Discussed benign etiology and prognosis. ?- Observe ?- Call  for any changes ? ?Melanocytic Nevi ?- Tan-brown and/or pink-flesh-colored symmetric macules and papules ?- Benign appearing on exam today ?- Observation ?- Call clinic for new or changing moles ?- Recommend  daily use of broad spectrum spf 30+ sunscreen to sun-exposed areas.  ? ?Hemangiomas ?- Red papules ?- Discussed benign nature ?- Observe ?- Call for any changes ? ?Actinic Damage ?- Chronic condition, secondary to cumulative UV/sun exposure ?- diffuse scaly erythematous macules with underlying dyspigmentation ?- Recommend daily broad spectrum sunscreen SPF 30+ to sun-exposed areas, reapply every 2 hours as needed.  ?- Staying in the shade or wearing long sleeves, sun glasses (UVA+UVB protection) and wide brim hats (4-inch brim around the entire circumference of the hat) are also recommended for sun protection.  ?- Call for new or changing lesions. ? ?History of Dysplastic Nevi ?Multiple see history  ?- No evidence of recurrence today ?- Recommend regular full body skin exams ?- Recommend daily broad spectrum sunscreen SPF 30+ to sun-exposed areas, reapply every 2 hours as needed.  ?- Call if any new or changing lesions are noted between office visits  ? ?Skin cancer screening performed today.  ? ?Return in about 1 year (around 01/11/2023) for TBSE, hx of Dysplastic nevus and return for Lipoma excision surgery. ? ?I, [name], CMA, am acting as scribe for Eric Ser, MD .  ?Documentation: I have reviewed the above documentation for accuracy and completeness, and I agree with the above. ? ?Eric Ser, MD ? ?

## 2022-01-10 NOTE — Progress Notes (Deleted)
? ?  Follow-Up Visit ?  ?Subjective  ?Eric Kline is a 41 y.o. male who presents for the following: Annual Exam. ? ? ? ?The following portions of the chart were reviewed this encounter and updated as appropriate:  ?  ?  ? ?Review of Systems:  No other skin or systemic complaints except as noted in HPI or Assessment and Plan. ? ?Objective  ?Well appearing patient in no apparent distress; mood and affect are within normal limits. ? ?A full examination was performed including scalp, head, eyes, ears, nose, lips, neck, chest, axillae, abdomen, back, buttocks, bilateral upper extremities, bilateral lower extremities, hands, feet, fingers, toes, fingernails, and toenails. All findings within normal limits unless otherwise noted below. ? ? ? ?Assessment & Plan  ? ?No follow-ups on file. ? ?I, [name], CMA, am acting as scribe for Sarina Ser, MD .  ?

## 2022-01-10 NOTE — Patient Instructions (Addendum)
? ?Pre-Operative Instructions ? ?You are scheduled for a surgical procedure at [facility]. We recommend you read the following instructions. If you have any questions or concerns, please call the office at [phone]. ? ?Shower and wash the entire body with soap and water the day of your surgery paying special attention to cleansing at and around the planned surgery site. ? ?Avoid aspirin or aspirin containing products at least fourteen (14) days prior to your surgical procedure and for at least one week (7 Days) after your surgical procedure. If you take aspirin on a regular basis for heart disease or history of stroke or for any other reason, we may recommend you continue taking aspirin but please notify us if you take this on a regular basis. Aspirin can cause more bleeding to occur during surgery as well as prolonged bleeding and bruising after surgery.  ? ?Avoid other nonsteroidal pain medications at least one week prior to surgery and at least one week prior to your surgery. These include medications such as Ibuprofen (Motrin, Advil and Nuprin), Naprosyn, Voltaren, Relafen, etc. If medications are used for therapeutic reasons, please inform us as they can cause increased bleeding or prolonged bleeding during and bruising after surgical procedures.  ? ?Please advise [place] if you are taking any "blood thinner" medications such as Coumadin or Dipyridamole or Plavix or similar medications. These cause increased bleeding and prolonged bleeding during procedures and bruising after surgical procedures. We may have to consider discontinuing these medications briefly prior to and shortly after your surgery if safe to do so.  ? ?Please inform us of all medications you are currently taking. All medications that are taken regularly should be taken the day of surgery as you always do. Nevertheless, we need to be informed of what medications you are taking prior to surgery to know whether they will affect the  procedure or cause any complications.  ? ?Please inform us of any medication allergies. Also inform us of whether you have allergies to Latex or rubber products or whether you have had any adverse reaction to Lidocaine or Epinephrine. ? ?Please inform us of any prosthetic or artificial body parts such as artificial heart valve, joint replacements, etc., or similar condition that might require preoperative antibiotics.  ? ?We recommend avoidance of alcohol at least two weeks prior to surgery and continued avoidance for at least two weeks after surgery.  ? ?We recommend discontinuation of tobacco smoking at least two weeks prior to surgery and continued abstinence for at least two weeks after surgery. ? ?Do not plan strenuous exercise, strenuous work or strenuous lifting for approximately four weeks after your surgery.  ? ?We request if you are unable to make your scheduled surgical appointment, please call us at least a week in advance or as soon as you are aware of a problem so that we can cancel or reschedule the appointment.  ? ?You MAY TAKE TYLENOL (acetaminophen) for pain as it is not a blood thinner.  ? ?PLEASE PLAN TO BE IN TOWN FOR TWO WEEKS FOLLOWING SURGERY, THIS IS IMPORTANT SO YOU CAN BE CHECKED FOR DRESSING CHANGES, SUTURE REMOVAL AND TO MONITOR FOR POSSIBLE COMPLICATIONS.  ? ? ? ?Wound Care Instructions ? ?Cleanse wound gently with soap and water once a day then pat dry with clean gauze. Apply a thing coat of Petrolatum (petroleum jelly, "Vaseline") over the wound (unless you have an allergy to this). We recommend that you use a new, sterile tube of Vaseline. Do not  pick or remove scabs. Do not remove the yellow or white "healing tissue" from the base of the wound. ? ?Cover the wound with fresh, clean, nonstick gauze and secure with paper tape. You may use Band-Aids in place of gauze and tape if the would is small enough, but would recommend trimming much of the tape off as there is often too much.  Sometimes Band-Aids can irritate the skin. ? ?You should call the office for your biopsy report after 1 week if you have not already been contacted. ? ?If you experience any problems, such as abnormal amounts of bleeding, swelling, significant bruising, significant pain, or evidence of infection, please call the office immediately. ? ?FOR ADULT SURGERY PATIENTS: If you need something for pain relief you may take 1 extra strength Tylenol (acetaminophen) AND 2 Ibuprofen ('200mg'$  each) together every 4 hours as needed for pain. (do not take these if you are allergic to them or if you have a reason you should not take them.) Typically, you may only need pain medication for 1 to 3 days.  ? ? ? ? ? ? ?Cryotherapy Aftercare ? ?Wash gently with soap and water everyday.   ?Apply Vaseline and Band-Aid daily until healed.  ? ? ? ?If You Need Anything After Your Visit ? ?If you have any questions or concerns for your doctor, please call our main line at [phone] and press option 4 to reach your doctor's medical assistant. If no one answers, please leave a voicemail as directed and we will return your call as soon as possible. Messages left after 4 pm will be answered the following business day.  ? ?You may also send [place] a message via MyChart. We typically respond to MyChart messages within 1-2 business days. ? ?For prescription refills, please ask your pharmacy to contact our office. Our fax number is [phone]. ? ?If you have an urgent issue when the clinic is closed that cannot wait until the next business day, you can page your doctor at the number below.   ? ?Please note that while we do our best to be available for urgent issues outside of office hours, we are not available 24/7.  ? ?If you have an urgent issue and are unable to reach [place], you may choose to seek medical care at your doctor's office, retail clinic, urgent care center, or emergency room. ? ?If you have a medical emergency, please immediately call 911 or  go to the emergency department. ? ?Pager Numbers ? ?- Dr. Nehemiah Massed: [phone] ? ?- Dr. Laurence Ferrari: [phone] ? ?- Dr. Nicole Kindred: [phone] ? ?In the event of inclement weather, please call our main line at [phone] for an update on the status of any delays or closures. ? ?Dermatology Medication Tips: ?Please keep the boxes that topical medications come in in order to help keep track of the instructions about where and how to use these. Pharmacies typically print the medication instructions only on the boxes and not directly on the medication tubes.  ? ?If your medication is too expensive, please contact our office at [phone] option 4 or send [place] a message through [place].  ? ?We are unable to tell what your co-pay for medications will be in advance as this is different depending on your insurance coverage. However, we may be able to find a substitute medication at lower cost or fill out paperwork to get insurance to cover a needed medication.  ? ?If a prior authorization is required to get your medication covered by  your insurance company, please allow [place] 1-2 business days to complete this process. ? ?Drug prices often vary depending on where the prescription is filled and some pharmacies may offer cheaper prices. ? ?The website www.goodrx.com contains coupons for medications through different pharmacies. The prices here do not account for what the cost may be with help from insurance (it may be cheaper with your insurance), but the website can give you the price if you did not use any insurance.  ?- You can print the associated coupon and take it with your prescription to the pharmacy.  ?- You may also stop by our office during regular business hours and pick up a GoodRx coupon card.  ?- If you need your prescription sent electronically to a different pharmacy, notify our office through [facility] or by phone at [phone] option 4. ? ? ? ? ?Si Usted Necesita Algo Despu?s de Su Visita ? ?Tambi?n  puede enviarnos un mensaje a trav?s de MyChart. Por lo general respondemos a los mensajes de MyChart en el transcurso de 1 a 2 d?as h?biles. ? ?Para renovar recetas, por favor pida a su farmacia que se ponga

## 2022-01-16 ENCOUNTER — Telehealth: Payer: Self-pay

## 2022-01-16 NOTE — Telephone Encounter (Signed)
Biopsy results discussed with pt return for surgery removal  ?

## 2022-02-20 ENCOUNTER — Encounter: Payer: Self-pay | Admitting: Dermatology

## 2022-02-20 ENCOUNTER — Ambulatory Visit: Payer: Federal, State, Local not specified - PPO | Admitting: Dermatology

## 2022-02-20 ENCOUNTER — Telehealth: Payer: Self-pay | Admitting: Orthopedic Surgery

## 2022-02-20 DIAGNOSIS — D235 Other benign neoplasm of skin of trunk: Secondary | ICD-10-CM

## 2022-02-20 DIAGNOSIS — L988 Other specified disorders of the skin and subcutaneous tissue: Secondary | ICD-10-CM | POA: Diagnosis not present

## 2022-02-20 DIAGNOSIS — D485 Neoplasm of uncertain behavior of skin: Secondary | ICD-10-CM

## 2022-02-20 MED ORDER — MUPIROCIN 2 % EX OINT: 1.0000 "application " | TOPICAL_OINTMENT | Freq: Every day | CUTANEOUS | 0 refills | Status: DC

## 2022-02-20 NOTE — Telephone Encounter (Signed)
Received vm from patient requesting copy of records. IC,lmvm advised needs to come in to complete & sign authorization form. [phone] ?

## 2022-02-20 NOTE — Patient Instructions (Signed)
Wound Care Instructions ? ?On the day following your surgery, you should begin doing daily dressing changes: ?Remove the old dressing and discard it. ?Cleanse the wound gently with tap water. This may be done in the shower or by placing a wet gauze pad directly on the wound and letting it soak for several minutes. ?It is important to gently remove any dried blood from the wound in order to encourage healing. This may be done by gently rolling a moistened Q-tip on the dried blood. Do not pick at the wound. ?If the wound should start to bleed, continue cleaning the wound, then place a moist gauze pad on the wound and hold pressure for a few minutes.  ?Make sure you then dry the skin surrounding the wound completely or the tape will not stick to the skin. Do not use cotton balls on the wound. ?After the wound is clean and dry, apply the ointment gently with a Q-tip. ?Cut a non-stick pad to fit the size of the wound. Lay the pad flush to the wound. If the wound is draining, you may want to reinforce it with a small amount of gauze on top of the non-stick pad for a little added compression to the area. ?Use the tape to seal the area completely. ?Select from the following with respect to your individual situation: ?If your wound has been stitched closed: continue the above steps 1-8 at least daily until your sutures are removed. ?If your wound has been left open to heal: continue steps 1-8 at least daily for the first 3-4 weeks. ?We would like for you to take a few extra precautions for at least the next week. ?Sleep with your head elevated on pillows if our wound is on your head. ?Do not bend over or lift heavy items to reduce the chance of elevated blood pressure to the wound ?Do not participate in particularly strenuous activities. ? ? ?Below is a list of dressing supplies you might need.  ?Cotton-tipped applicators - Q-tips ?Gauze pads (2x2 and/or 4x4) - All-Purpose Sponges ?Non-stick dressing material - Telfa ?Tape -  Paper or Hypafix ?New and clean tube of petroleum jelly - Vaseline  ? ? ?Comments on Post-Operative Period ?Slight swelling and redness often appear around the wound. This is normal and will disappear within several days following the surgery. ?The healing wound will drain a brownish-red-yellow discharge during healing. This is a normal phase of wound healing. As the wound begins to heal, the drainage may increase in amount. Again, this drainage is normal. ?Notify us if the drainage becomes persistently bloody, excessively swollen, or intensely painful or develops a foul odor or red streaks.  ?If you should experience mild discomfort during the healing phase, you may take an aspirin-free medication such as Tylenol (acetaminophen). Notify us if the discomfort is severe or persistent. Avoid alcoholic beverages when taking pain medicine. ? ?In Case of Wound Hemorrhage ?A wound hemorrhage is when the bandage suddenly becomes soaked with bright red blood and flows profusely. If this happens, sit down or lie down with your head elevated. If the wound has a dressing on it, do not remove the dressing. Apply pressure to the existing gauze. If the wound is not covered, use a gauze pad to apply pressure and continue applying the pressure for 20 minutes without peeking. DO NOT COVER THE WOUND WITH A LARGE TOWEL OR WASH CLOTH. Release your hand from the wound site but do not remove the dressing. If the bleeding has stopped,   gently clean around the wound. Leave the dressing in place for 24 hours if possible. This wait time allows the blood vessels to close off so that you do not spark a new round of bleeding by disrupting the newly clotted blood vessels with an immediate dressing change. If the bleeding does not subside, continue to hold pressure. If matters are out of your control, contact an After Hours clinic or go to the Emergency Room. ? ? ? ?If You Need Anything After Your Visit ? ?If you have any questions or concerns for  your doctor, please call our main line at [phone] and press option 4 to reach your doctor's medical assistant. If no one answers, please leave a voicemail as directed and we will return your call as soon as possible. Messages left after 4 pm will be answered the following business day.  ? ?You may also send [place] a message via MyChart. We typically respond to MyChart messages within 1-2 business days. ? ?For prescription refills, please ask your pharmacy to contact our office. Our fax number is [phone]. ? ?If you have an urgent issue when the clinic is closed that cannot wait until the next business day, you can page your doctor at the number below.   ? ?Please note that while we do our best to be available for urgent issues outside of office hours, we are not available 24/7.  ? ?If you have an urgent issue and are unable to reach [place], you may choose to seek medical care at your doctor's office, retail clinic, urgent care center, or emergency room. ? ?If you have a medical emergency, please immediately call 911 or go to the emergency department. ? ?Pager Numbers ? ?- Dr. Nehemiah Massed: [phone] ? ?- Dr. Laurence Ferrari: [phone] ? ?- Dr. Nicole Kindred: [phone] ? ?In the event of inclement weather, please call our main line at [phone] for an update on the status of any delays or closures. ? ?Dermatology Medication Tips: ?Please keep the boxes that topical medications come in in order to help keep track of the instructions about where and how to use these. Pharmacies typically print the medication instructions only on the boxes and not directly on the medication tubes.  ? ?If your medication is too expensive, please contact our office at [phone] option 4 or send [place] a message through [place].  ? ?We are unable to tell what your co-pay for medications will be in advance as this is different depending on your insurance coverage. However, we may be able to find a substitute medication at lower cost or fill out  paperwork to get insurance to cover a needed medication.  ? ?If a prior authorization is required to get your medication covered by your insurance company, please allow [place] 1-2 business days to complete this process. ? ?Drug prices often vary depending on where the prescription is filled and some pharmacies may offer cheaper prices. ? ?The website www.goodrx.com contains coupons for medications through different pharmacies. The prices here do not account for what the cost may be with help from insurance (it may be cheaper with your insurance), but the website can give you the price if you did not use any insurance.  ?- You can print the associated coupon and take it with your prescription to the pharmacy.  ?- You may also stop by our office during regular business hours and pick up a GoodRx coupon card.  ?- If you need your prescription sent electronically to a different pharmacy, notify our  office through [facility] or by phone at [phone] option 4. ? ? ? ? ?Si Usted Necesita Algo Despu?s de Su Visita ? ?Tambi?n puede enviarnos un mensaje a trav?s de MyChart. Por lo general respondemos a los mensajes de MyChart en el transcurso de 1 a 2 d?as h?biles. ? ?Para renovar recetas, por favor pida a su farmacia que se ponga en contacto con nuestra oficina. Nuestro n?mero de fax es el [phone]. ? ?Si tiene un asunto urgente cuando la cl?nica est? cerrada y que no puede esperar hasta el siguiente d?a h?bil, puede llamar/localizar a su doctor(a) al n?mero que aparece a continuaci?n.  ? ?Por favor, tenga en cuenta que aunque hacemos todo lo posible para estar disponibles para asuntos urgentes fuera del horario de oficina, no estamos disponibles las 24 horas del d?a, los 7 d?as de la semana.  ? ?Si tiene un problema urgente y no puede comunicarse con nosotros, puede optar por buscar atenci?n m?dica  en el consultorio de su doctor(a), en una cl?nica privada, en un centro de atenci?n urgente o en una sala de  emergencias. ? ?Si tiene Engineer, maintenance (IT) m?dica, por favor llame inmediatamente al 911 o vaya a la sala de emergencias. ? ?N?meros de b?per ? ?- Dr. Nehemiah Massed: [phone] ? ?- Dra. Moye: [phone] ? ?- Dra

## 2022-02-20 NOTE — Progress Notes (Signed)
? ?  Follow-Up Visit ?  ?Subjective  ?Eric Kline is a 41 y.o. male who presents for the following: Procedure (Biopsy proven Severe dysplastic nevus of right sup medial buttock - [facility] today). ? ?The following portions of the chart were reviewed this encounter and updated as appropriate:  ? Tobacco  Allergies  Meds  Problems  Med Hx  Surg Hx  Fam Hx   ?  ?Review of Systems:  No other skin or systemic complaints except as noted in HPI or Assessment and Plan. ? ?Objective  ?Well appearing patient in no apparent distress; mood and affect are within normal limits. ? ?A focused examination was performed including buttocks. Relevant physical exam findings are noted in the Assessment and Plan. ? ?Right sup medial buttock ?Healing biopsy site ? ? ?Assessment & Plan  ?Neoplasm of uncertain behavior of skin ?Right sup medial buttock ? ?Skin excision ? ?Lesion length (cm):  0.7 ?Lesion width (cm):  0.5 ?Margin per side (cm):  0.2 ?Total excision diameter (cm):  1.1 ?Informed consent: discussed and consent obtained   ?Timeout: patient name, date of birth, surgical site, and procedure verified   ?Procedure prep:  Patient was prepped and draped in usual sterile fashion ?Prep type:  Isopropyl alcohol and povidone-iodine ?Anesthesia: the lesion was anesthetized in a standard fashion   ?Anesthetic:  1% lidocaine w/ epinephrine 1-100,000 buffered w/ 8.4% NaHCO3 ?Instrument used: #15 blade   ?Hemostasis achieved with: pressure   ?Hemostasis achieved with comment:  Electrocautery ?Outcome: patient tolerated procedure well with no complications   ?Post-procedure details: sterile dressing applied and wound care instructions given   ?Dressing type: bandage and pressure dressing (mupirocin)   ? ?Skin repair ?Complexity:  Complex ?Final length (cm):  2.7 ?Reason for type of repair: reduce tension to allow closure, reduce the risk of dehiscence, infection, and necrosis, reduce subcutaneous dead space and avoid a hematoma, allow closure  of the large defect, preserve normal anatomy, preserve normal anatomical and functional relationships and enhance both functionality and cosmetic results   ?Undermining: area extensively undermined   ?Undermining comment:  Undermining defect 1.0 cm ?Subcutaneous layers (deep stitches):  ?Suture size:  4-0 ?Suture type: Vicryl (polyglactin 910)   ?Subcutaneous suture technique: inverted dermal. ?Fine/surface layer approximation (top stitches):  ?Suture size:  4-0 ?Suture type: nylon   ?Stitches: simple running   ?Suture removal (days):  7 ?Hemostasis achieved with: suture and pressure ?Outcome: patient tolerated procedure well with no complications   ?Post-procedure details: sterile dressing applied and wound care instructions given   ?Dressing type: bandage and pressure dressing (mupirocin)   ? ?mupirocin ointment (BACTROBAN) 2 % ?Apply 1 application. topically daily. With dressing changes ? ?Specimen 1 - Surgical pathology ?Differential Diagnosis: Biopsy proven severe dysplastic nevus ?Check Margins: Yes ?[id number] ? ? ?Return in about 1 week (around 02/27/2022) for suture removal. ? ?I, Ashok Cordia, CMA, am acting as scribe for Sarina Ser, MD . ?Documentation: I have reviewed the above documentation for accuracy and completeness, and I agree with the above. ? ?Sarina Ser, MD ? ?

## 2022-02-27 ENCOUNTER — Ambulatory Visit: Payer: Federal, State, Local not specified - PPO | Admitting: Dermatology

## 2022-02-28 ENCOUNTER — Ambulatory Visit (INDEPENDENT_AMBULATORY_CARE_PROVIDER_SITE_OTHER): Payer: Federal, State, Local not specified - PPO | Admitting: Dermatology

## 2022-02-28 ENCOUNTER — Encounter: Payer: Self-pay | Admitting: Dermatology

## 2022-02-28 DIAGNOSIS — Z4802 Encounter for removal of sutures: Secondary | ICD-10-CM

## 2022-02-28 DIAGNOSIS — D239 Other benign neoplasm of skin, unspecified: Secondary | ICD-10-CM

## 2022-02-28 DIAGNOSIS — D235 Other benign neoplasm of skin of trunk: Secondary | ICD-10-CM

## 2022-02-28 NOTE — Progress Notes (Signed)
   Follow-Up Visit   Subjective  Eric Kline is a 41 y.o. male who presents for the following: Suture removal/post op (Pathology proven margins free severely dysplastic nevus of the R sup med buttock - patient is here today for suture removal).  The following portions of the chart were reviewed this encounter and updated as appropriate:   Tobacco  Allergies  Meds  Problems  Med Hx  Surg Hx  Fam Hx     Review of Systems:  No other skin or systemic complaints except as noted in HPI or Assessment and Plan.  Objective  Well appearing patient in no apparent distress; mood and affect are within normal limits.  A focused examination was performed including the buttocks. Relevant physical exam findings are noted in the Assessment and Plan.  R sup med buttock Healing excision site.   Assessment & Plan  Dysplastic nevus R sup med buttock  Encounter for Removal of Sutures - Incision site at the R sup med buttocks is clean, dry and intact - Wound cleansed, sutures removed, wound cleansed and steri strips applied.  - Discussed pathology results showing margins free severely dysplastic nevus.  - Patient advised to keep steri-strips dry until they fall off. - Scars remodel for a full year. - Once steri-strips fall off, patient can apply over-the-counter silicone scar cream each night to help with scar remodeling if desired. - Patient advised to call with any concerns or if they notice any new or changing lesions.  Return for appointment as scheduled.  [name], CMA, am acting as scribe for Sarina Ser, MD . Documentation: I have reviewed the above documentation for accuracy and completeness, and I agree with the above.  Sarina Ser, MD

## 2022-02-28 NOTE — Patient Instructions (Signed)

## 2022-03-14 ENCOUNTER — Encounter: Payer: Self-pay | Admitting: Dermatology

## 2022-03-26 ENCOUNTER — Telehealth: Payer: Self-pay | Admitting: Orthopedic Surgery

## 2022-03-26 NOTE — Telephone Encounter (Signed)
Received medical records release form from patient  

## 2022-05-08 ENCOUNTER — Ambulatory Visit (INDEPENDENT_AMBULATORY_CARE_PROVIDER_SITE_OTHER): Payer: Federal, State, Local not specified - PPO | Admitting: Dermatology

## 2022-05-08 ENCOUNTER — Telehealth: Payer: Self-pay

## 2022-05-08 DIAGNOSIS — D485 Neoplasm of uncertain behavior of skin: Secondary | ICD-10-CM

## 2022-05-08 DIAGNOSIS — D1722 Benign lipomatous neoplasm of skin and subcutaneous tissue of left arm: Secondary | ICD-10-CM | POA: Diagnosis not present

## 2022-05-08 MED ORDER — MUPIROCIN 2 % EX OINT: 1.0000 | TOPICAL_OINTMENT | Freq: Every day | CUTANEOUS | 0 refills | Status: DC

## 2022-05-08 NOTE — Telephone Encounter (Signed)
Lft pt msg to call if any problems after today's surgery./sh 

## 2022-05-08 NOTE — Patient Instructions (Signed)

## 2022-05-08 NOTE — Progress Notes (Signed)
   Follow-Up Visit   Subjective  Eric Kline is a 41 y.o. male who presents for the following: Procedure (Lipoma vs other of left medial distal tricep - [facility] today).  The following portions of the chart were reviewed this encounter and updated as appropriate:   Tobacco  Allergies  Meds  Problems  Med Hx  Surg Hx  Fam Hx     Review of Systems:  No other skin or systemic complaints except as noted in HPI or Assessment and Plan.  Objective  Well appearing patient in no apparent distress; mood and affect are within normal limits.  A focused examination was performed including left arm. Relevant physical exam findings are noted in the Assessment and Plan.  Left medial distal tricep Rubbery nodule 3.5 x 1.5 cm   Assessment & Plan  Neoplasm of uncertain behavior of skin Left medial distal tricep  Skin excision  Lesion length (cm):  3.5 Lesion width (cm):  1.5 Margin per side (cm):  0 Total excision diameter (cm):  3.5 Informed consent: discussed and consent obtained   Timeout: patient name, date of birth, surgical site, and procedure verified   Procedure prep:  Patient was prepped and draped in usual sterile fashion Prep type:  Isopropyl alcohol and povidone-iodine Anesthesia: the lesion was anesthetized in a standard fashion   Anesthetic:  1% lidocaine w/ epinephrine 1-100,000 buffered w/ 8.4% NaHCO3 Instrument used: #15 blade   Hemostasis achieved with: pressure   Hemostasis achieved with comment:  Electrocautery Outcome: patient tolerated procedure well with no complications   Post-procedure details: sterile dressing applied and wound care instructions given   Dressing type: bandage and pressure dressing (mupirocin)    Skin repair Complexity:  Complex Final length (cm):  3 Reason for type of repair: reduce tension to allow closure, reduce the risk of dehiscence, infection, and necrosis, reduce subcutaneous dead space and avoid a hematoma, allow closure of the large  defect, preserve normal anatomy, preserve normal anatomical and functional relationships and enhance both functionality and cosmetic results   Undermining: area extensively undermined   Undermining comment:  Undermining defect 1.5 cm Subcutaneous layers (deep stitches):  Suture size:  4-0 Suture type: Vicryl (polyglactin 910)   Subcutaneous suture technique: inverted dermal. Fine/surface layer approximation (top stitches):  Suture size:  4-0 Suture type: nylon   Stitches: simple running   Suture removal (days):  7 Hemostasis achieved with: suture and pressure Outcome: patient tolerated procedure well with no complications   Post-procedure details: sterile dressing applied and wound care instructions given   Dressing type: bandage and pressure dressing (mupirocin)    mupirocin ointment (BACTROBAN) 2 % Apply 1 Application topically daily. With dressing changes  Specimen 1 - Surgical pathology Differential Diagnosis: Lipoma vs other Check Margins: No  Related Medications mupirocin ointment (BACTROBAN) 2 % Apply 1 application. topically daily. With dressing changes   Return in about 1 week (around 05/15/2022) for suture removal.  I, Ashok Cordia, CMA, am acting as scribe for Sarina Ser, MD . Documentation: I have reviewed the above documentation for accuracy and completeness, and I agree with the above.  Sarina Ser, MD

## 2022-05-10 ENCOUNTER — Telehealth: Payer: Self-pay

## 2022-05-10 NOTE — Telephone Encounter (Signed)
-----   Message from Ralene Bathe, MD sent at 05/09/2022  5:20 PM EDT ----- Diagnosis Skin (M), left medial distal tricep ANGIOLIPOMA  Benign Lipoma (Angiolipoma variant)

## 2022-05-10 NOTE — Telephone Encounter (Signed)
Patient informed of pathology results 

## 2022-05-15 ENCOUNTER — Ambulatory Visit: Payer: Federal, State, Local not specified - PPO | Admitting: Dermatology

## 2022-05-15 DIAGNOSIS — Z4802 Encounter for removal of sutures: Secondary | ICD-10-CM

## 2022-05-15 DIAGNOSIS — D1739 Benign lipomatous neoplasm of skin and subcutaneous tissue of other sites: Secondary | ICD-10-CM

## 2022-05-15 DIAGNOSIS — D179 Benign lipomatous neoplasm, unspecified: Secondary | ICD-10-CM

## 2022-05-15 NOTE — Progress Notes (Signed)
   Follow-Up Visit   Subjective  Eric Kline is a 41 y.o. 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  The following portions of the chart were reviewed this encounter and updated as appropriate:   Tobacco  Allergies  Meds  Problems  Med Hx  Surg Hx  Fam Hx     Review of Systems:  No other skin or systemic complaints except as noted in HPI or Assessment and Plan.  Objective  Well appearing patient in no apparent distress; mood and affect are within normal limits.  A focused examination was performed including the extremities. Relevant physical exam findings are noted in the Assessment and Plan.  L med distal tricep Healing excision site.    Assessment & Plan  Angiolipoma L med distal tricep  Encounter for Removal of Sutures - Incision site at the L med distal tricep is clean, dry and intact - Wound cleansed, sutures removed, wound cleansed and steri strips applied.  - Discussed pathology results showing a benign angiolipoma.  - Patient advised to keep steri-strips dry until they fall off. - Scars remodel for a full year. - Once steri-strips fall off, patient can apply over-the-counter silicone scar cream each night to help with scar remodeling if desired. - Patient advised to call with any concerns or if they notice any new or changing lesions.  Return for appointment as scheduled.  [name], CMA, am acting as scribe for Sarina Ser, MD . Documentation: I have reviewed the above documentation for accuracy and completeness, and I agree with the above.  Sarina Ser, MD

## 2022-05-15 NOTE — Patient Instructions (Signed)
Due to recent changes in healthcare laws, you may see results of your pathology and/or laboratory studies on MyChart before the doctors have had a chance to review them. We understand that in some cases there may be results that are confusing or concerning to you. Please understand that not all results are received at the same time and often the doctors may need to interpret multiple results in order to provide you with the best plan of care or course of treatment. Therefore, we ask that you please give us 2 business days to thoroughly review all your results before contacting the office for clarification. Should we see a critical lab result, you will be contacted sooner.   If You Need Anything After Your Visit  If you have any questions or concerns for your doctor, please call our main line at [phone] and press option 4 to reach your doctor's medical assistant. If no one answers, please leave a voicemail as directed and we will return your call as soon as possible. Messages left after 4 pm will be answered the following business day.   You may also send us a message via MyChart. We typically respond to MyChart messages within 1-2 business days.  For prescription refills, please ask your pharmacy to contact our office. Our fax number is [phone].  If you have an urgent issue when the clinic is closed that cannot wait until the next business day, you can page your doctor at the number below.    Please note that while we do our best to be available for urgent issues outside of office hours, we are not available 24/7.   If you have an urgent issue and are unable to reach us, you may choose to seek medical care at your doctor's office, retail clinic, urgent care center, or emergency room.  If you have a medical emergency, please immediately call 911 or go to the emergency department.  Pager Numbers  - Dr. Kowalski: [phone]  - Dr. Moye: [phone]  - Dr. Stewart:  [phone]  In the event of inclement weather, please call our main line at [phone] for an update on the status of any delays or closures.  Dermatology Medication Tips: Please keep the boxes that topical medications come in in order to help keep track of the instructions about where and how to use these. Pharmacies typically print the medication instructions only on the boxes and not directly on the medication tubes.   If your medication is too expensive, please contact our office at [phone] option 4 or send us a message through MyChart.   We are unable to tell what your co-pay for medications will be in advance as this is different depending on your insurance coverage. However, we may be able to find a substitute medication at lower cost or fill out paperwork to get insurance to cover a needed medication.   If a prior authorization is required to get your medication covered by your insurance company, please allow us 1-2 business days to complete this process.  Drug prices often vary depending on where the prescription is filled and some pharmacies may offer cheaper prices.  The website www.goodrx.com contains coupons for medications through different pharmacies. The prices here do not account for what the cost may be with help from insurance (it may be cheaper with your insurance), but the website can give you the price if you did not use any insurance.  - You can print the associated coupon and take it with   your prescription to the pharmacy.  - You may also stop by our office during regular business hours and pick up a GoodRx coupon card.  - If you need your prescription sent electronically to a different pharmacy, notify our office through [facility] MyChart or by phone at [phone] option 4.     Si Usted Necesita Algo Despus de Su Visita  Tambin puede enviarnos un mensaje a travs de MyChart. Por lo general respondemos a los mensajes de MyChart en el transcurso de 1 a 2  das hbiles.  Para renovar recetas, por favor pida a su farmacia que se ponga en contacto con nuestra oficina. Nuestro nmero de fax es el [phone].  Si tiene un asunto urgente cuando la clnica est cerrada y que no puede esperar hasta el siguiente da hbil, puede llamar/localizar a su doctor(a) al nmero que aparece a continuacin.   Por favor, tenga en cuenta que aunque hacemos todo lo posible para estar disponibles para asuntos urgentes fuera del horario de oficina, no estamos disponibles las 24 horas del da, los 7 das de la semana.   Si tiene un problema urgente y no puede comunicarse con nosotros, puede optar por buscar atencin mdica  en el consultorio de su doctor(a), en una clnica privada, en un centro de atencin urgente o en una sala de emergencias.  Si tiene una emergencia mdica, por favor llame inmediatamente al 911 o vaya a la sala de emergencias.  Nmeros de bper  - Dr. Kowalski: [phone]  - Dra. Moye: [phone]  - Dra. Stewart: [phone]  En caso de inclemencias del tiempo, por favor llame a nuestra lnea principal al [phone] para una actualizacin sobre el estado de cualquier retraso o cierre.  Consejos para la medicacin en dermatologa: Por favor, guarde las cajas en las que vienen los medicamentos de uso tpico para ayudarle a seguir las instrucciones sobre dnde y cmo usarlos. Las farmacias generalmente imprimen las instrucciones del medicamento slo en las cajas y no directamente en los tubos del medicamento.   Si su medicamento es muy caro, por favor, pngase en contacto con nuestra oficina llamando al [phone] y presione la opcin 4 o envenos un mensaje a travs de MyChart.   No podemos decirle cul ser su copago por los medicamentos por adelantado ya que esto es diferente dependiendo de la cobertura de su seguro. Sin embargo, es posible que podamos encontrar un medicamento sustituto a menor costo o llenar un formulario para que el  seguro cubra el medicamento que se considera necesario.   Si se requiere una autorizacin previa para que su compaa de seguros cubra su medicamento, por favor permtanos de 1 a 2 das hbiles para completar este proceso.  Los precios de los medicamentos varan con frecuencia dependiendo del lugar de dnde se surte la receta y alguna farmacias pueden ofrecer precios ms baratos.  El sitio web www.goodrx.com tiene cupones para medicamentos de diferentes farmacias. Los precios aqu no tienen en cuenta lo que podra costar con la ayuda del seguro (puede ser ms barato con su seguro), pero el sitio web puede darle el precio si no utiliz ningn seguro.  - Puede imprimir el cupn correspondiente y llevarlo con su receta a la farmacia.  - Tambin puede pasar por nuestra oficina durante el horario de atencin regular y recoger una tarjeta de cupones de GoodRx.  - Si necesita que su receta se enve electrnicamente a una farmacia diferente, informe a nuestra oficina a travs de MyChart de    o por telfono llamando al [phone] y presione la opcin 4.  

## 2022-05-18 ENCOUNTER — Encounter: Payer: Self-pay | Admitting: Dermatology

## 2022-05-20 ENCOUNTER — Encounter: Payer: Self-pay | Admitting: Dermatology

## 2022-07-03 ENCOUNTER — Encounter: Payer: Self-pay | Admitting: Nurse Practitioner

## 2022-07-05 NOTE — Progress Notes (Unsigned)
   There were no vitals taken for this visit.   Subjective:    Patient ID: Eric Kline, male    DOB: [birth year], 42 y.o.   MRN: [id number]  HPI: Eric Kline is a 41 y.o. male  No chief complaint on file.  NECK PAIN FOLLOW UP Diagnosis:  Status: {Blank single:19197::"controlled","uncontrolled","better","worse","exacerbated","stable","resolved"} Treatments attempted: {Blank multiple:19196::"none","rest","ice","heat","APAP","ibuprofen","aleve","muscle relaxer","physical therapy","HEP","OMM"}  Compliant with recommended treatment: {Blank single:19197::"yes","no","average"} Relief with NSAIDs?:  {Blank single:19197::"No NSAIDs Taken","no","mild","moderate","significant"} Location:{Blank multiple:19196::"Right","Left","R>L","L>R","midline"} Duration:{Blank single:19197::"chronic","days","weeks","months"} Severity: {Blank single:19197::"mild","moderate","severe","1/10","2/10","3/10","4/10","5/10","6/10","7/10","8/10","9/10","10/10"} Quality: {Blank multiple:19196::"sharp","dull","aching","burning","cramping","ill-defined","itchy","pressure-like","pulling","shooting","sore","stabbing","tender","tearing","throbbing"} Frequency: {Blank single:19197::"constant","intermittent","occasional","rare","every few minutes","a few times a hour","a few times a day","a few times a week","a few times a month","a few times a year"} Radiation: {Blank single:19197::"none","yes","R arm","L arm","low back","headache"} Aggravating factors: {Blank multiple:19196::"none","lifting","movement","walking","laying","bending","prolonged sitting","coughing","valsalva","Pain increased with coughing/valsalva"} Alleviating factors: {Blank multiple:19196::"nothing","rest","ice","heat","laying","NSAIDs","APAP","narcotics","muscle relaxer"} Weakness:  {Blank single:19197::"yes","no"} Paresthesias / decreased sensation:  {Blank single:19197::"yes","no"}  Fevers:  {Blank single:19197::"yes","no"}   Relevant past medical,  surgical, family and social history reviewed and updated as indicated. Interim medical history since our last visit reviewed. Allergies and medications reviewed and updated.  Review of Systems  Per HPI unless specifically indicated above     Objective:    There were no vitals taken for this visit.  Wt Readings from Last 3 Encounters:  11/08/21 212 lb 6.4 oz (96.3 kg)  09/26/21 210 lb 12.8 oz (95.6 kg)  09/21/21 197 lb 9.6 oz (89.6 kg)    Physical Exam  Results for orders placed or performed in visit on 10/05/21  CBC with Differential/Platelet  Result Value Ref Range   WBC 7.9 3.4 - 10.8 x10E3/uL   RBC 4.81 4.14 - 5.80 x10E6/uL   Hemoglobin 15.0 13.0 - 17.7 g/dL   Hematocrit 44.8 37.5 - 51.0 %   MCV 93 79 - 97 fL   MCH 31.2 26.6 - 33.0 pg   MCHC 33.5 31.5 - 35.7 g/dL   RDW 12.4 11.6 - 15.4 %   Platelets 271 150 - 450 x10E3/uL   Neutrophils 57 Not Estab. %   Lymphs 32 Not Estab. %   Monocytes 7 Not Estab. %   Eos 3 Not Estab. %   Basos 1 Not Estab. %   Neutrophils Absolute 4.5 1.4 - 7.0 x10E3/uL   Lymphocytes Absolute 2.6 0.7 - 3.1 x10E3/uL   Monocytes Absolute 0.5 0.1 - 0.9 x10E3/uL   EOS (ABSOLUTE) 0.2 0.0 - 0.4 x10E3/uL   Basophils Absolute 0.0 0.0 - 0.2 x10E3/uL   Immature Granulocytes 0 Not Estab. %   Immature Grans (Abs) 0.0 0.0 - 0.1 x10E3/uL  Testosterone, free, total([facility])  Result Value Ref Range   Testosterone 388 264 - 916 ng/dL   Testosterone, Free 14.0 6.8 - 21.5 pg/mL   Sex Hormone Binding 32.0 16.5 - 55.9 nmol/L      Assessment & Plan:   Problem List Items Addressed This Visit   None    Follow up plan: No follow-ups on file.

## 2022-07-06 ENCOUNTER — Encounter: Payer: Self-pay | Admitting: Nurse Practitioner

## 2022-07-06 ENCOUNTER — Ambulatory Visit: Payer: Federal, State, Local not specified - PPO | Admitting: Nurse Practitioner

## 2022-07-06 VITALS — BP 127/81 | HR 69 | Temp 97.7°F | Wt 215.0 lb

## 2022-07-06 DIAGNOSIS — M2550 Pain in unspecified joint: Secondary | ICD-10-CM | POA: Diagnosis not present

## 2022-07-06 DIAGNOSIS — R5382 Chronic fatigue, unspecified: Secondary | ICD-10-CM | POA: Diagnosis not present

## 2022-07-12 LAB — EHRLICHIA ANTIBODY PANEL
E. Chaffeensis (HME) IgM Titer: NEGATIVE
E.Chaffeensis (HME) IgG: NEGATIVE
HGE IgG Titer: NEGATIVE
HGE IgM Titer: NEGATIVE

## 2022-07-12 LAB — ANA+ENA+DNA/DS+ANTICH+CENTR
ANA Titer 1: NEGATIVE
Anti JO-1: <0.2 AI (ref 0.0–0.9)
Centromere Ab Screen: <0.2 AI (ref 0.0–0.9)
Chromatin Ab SerPl-aCnc: <0.2 AI (ref 0.0–0.9)
ENA RNP Ab: <0.2 AI (ref 0.0–0.9)
ENA SM Ab Ser-aCnc: <0.2 AI (ref 0.0–0.9)
ENA SSA (RO) Ab: <0.2 AI (ref 0.0–0.9)
ENA SSB (LA) Ab: <0.2 AI (ref 0.0–0.9)
Scleroderma (Scl-70) (ENA) Antibody, IgG: <0.2 AI (ref 0.0–0.9)
dsDNA Ab: 2 IU/mL (ref 0–9)

## 2022-07-12 LAB — ALPHA-GAL PANEL
Allergen Lamb IgE: <0.1 kU/L
Beef IgE: <0.1 kU/L
IgE (Immunoglobulin E), Serum: 5 IU/mL — ABNORMAL LOW (ref 6–495)
O215-IgE Alpha-Gal: <0.1 kU/L
Pork IgE: <0.1 kU/L

## 2022-07-12 LAB — ANA: Anti Nuclear Antibody (ANA): NEGATIVE

## 2022-07-12 LAB — COMPREHENSIVE METABOLIC PANEL
ALT: 18 IU/L (ref 0–44)
AST: 16 IU/L (ref 0–40)
Albumin/Globulin Ratio: 2.4 — ABNORMAL HIGH (ref 1.2–2.2)
Albumin: 4.7 g/dL (ref 4.1–5.1)
Alkaline Phosphatase: 63 IU/L (ref 44–121)
BUN/Creatinine Ratio: 13 (ref 9–20)
BUN: 13 mg/dL (ref 6–24)
Bilirubin Total: 0.5 mg/dL (ref 0.0–1.2)
CO2: 22 mmol/L (ref 20–29)
Calcium: 9.4 mg/dL (ref 8.7–10.2)
Chloride: 104 mmol/L (ref 96–106)
Creatinine, Ser: 1.03 mg/dL (ref 0.76–1.27)
Globulin, Total: 2 g/dL (ref 1.5–4.5)
Glucose: 104 mg/dL — ABNORMAL HIGH (ref 70–99)
Potassium: 4.6 mmol/L (ref 3.5–5.2)
Sodium: 141 mmol/L (ref 134–144)
Total Protein: 6.7 g/dL (ref 6.0–8.5)
eGFR: 92 mL/min/{1.73_m2} (ref >59)

## 2022-07-12 LAB — CBC WITH DIFFERENTIAL/PLATELET
Basophils Absolute: 0 10*3/uL (ref 0.0–0.2)
Basos: 1 %
EOS (ABSOLUTE): 0.1 10*3/uL (ref 0.0–0.4)
Eos: 2 %
Hematocrit: 43.4 % (ref 37.5–51.0)
Hemoglobin: 14.6 g/dL (ref 13.0–17.7)
Immature Grans (Abs): 0 10*3/uL (ref 0.0–0.1)
Immature Granulocytes: 0 %
Lymphocytes Absolute: 2.1 10*3/uL (ref 0.7–3.1)
Lymphs: 35 %
MCH: 30.5 pg (ref 26.6–33.0)
MCHC: 33.6 g/dL (ref 31.5–35.7)
MCV: 91 fL (ref 79–97)
Monocytes Absolute: 0.3 10*3/uL (ref 0.1–0.9)
Monocytes: 5 %
Neutrophils Absolute: 3.3 10*3/uL (ref 1.4–7.0)
Neutrophils: 57 %
Platelets: 274 10*3/uL (ref 150–450)
RBC: 4.78 x10E6/uL (ref 4.14–5.80)
RDW: 12.1 % (ref 11.6–15.4)
WBC: 5.9 10*3/uL (ref 3.4–10.8)

## 2022-07-12 LAB — ROCKY MTN SPOTTED FVR ABS PNL(IGG+IGM)
RMSF IgG: NEGATIVE
RMSF IgM: 0.41 index (ref 0.00–0.89)

## 2022-07-12 LAB — BABESIA MICROTI ANTIBODY PANEL
Babesia microti IgG: <1:10 {titer}
Babesia microti IgM: <1:10 {titer}

## 2022-07-12 LAB — LYME DISEASE SEROLOGY W/REFLEX: Lyme Total Antibody EIA: NEGATIVE

## 2022-07-12 LAB — CK: Total CK: 118 U/L (ref 49–439)

## 2022-07-12 LAB — URIC ACID: Uric Acid: 6.1 mg/dL (ref 3.8–8.4)

## 2022-07-12 LAB — RHEUMATOID FACTOR: Rheumatoid fact SerPl-aCnc: <10 IU/mL (ref <14.0)

## 2022-07-12 NOTE — Progress Notes (Signed)
HI Eric Kline.  Your lab work has returned.  It is unremarkable for a reason to cause your symptoms.  Your tick borne illness labs are negative and do not require treatment.  No evidence of autoimmune disorder.

## 2022-07-16 MED ORDER — CYCLOBENZAPRINE HCL 10 MG PO TABS: 10.0000 mg | ORAL_TABLET | Freq: Three times a day (TID) | ORAL | 0 refills | Status: DC | PRN

## 2022-07-18 ENCOUNTER — Ambulatory Visit: Payer: Federal, State, Local not specified - PPO | Admitting: Nurse Practitioner

## 2022-07-18 ENCOUNTER — Encounter: Payer: Self-pay | Admitting: Nurse Practitioner

## 2022-07-18 VITALS — BP 108/68 | HR 70 | Temp 98.1°F | Ht 70.0 in | Wt 206.9 lb

## 2022-07-18 DIAGNOSIS — M25512 Pain in left shoulder: Secondary | ICD-10-CM

## 2022-07-18 DIAGNOSIS — G8929 Other chronic pain: Secondary | ICD-10-CM

## 2022-07-18 MED ORDER — LIDOCAINE 5 % EX PTCH: 1.0000 | MEDICATED_PATCH | CUTANEOUS | 0 refills | Status: DC

## 2022-07-18 MED ORDER — METHYLPREDNISOLONE 4 MG PO TBPK: ORAL_TABLET | ORAL | 0 refills | Status: DC

## 2022-07-18 NOTE — Patient Instructions (Signed)

## 2022-07-18 NOTE — Assessment & Plan Note (Signed)
Chronic for over 6 weeks.  Has tried multiple methods for pain, including chiropractor/acupuncture/PT.  At this time will place referral to ortho for further evaluation.  Recommend continue home treatment and will send in steroid taper and Lidocaine patches.  Obtain Voltaren gel over the counter.

## 2022-07-18 NOTE — Progress Notes (Signed)
BP 108/68   Pulse 70   Temp 98.1 F (36.7 C) (Oral)   Ht 5' 10" (1.778 m)   Wt 206 lb 14.4 oz (93.8 kg)   SpO2 98%   BMI 29.69 kg/m    Subjective:    Patient ID: Eric Kline, male    DOB: [birth year], 41 y.o.   MRN: [id number]  HPI: Eric Kline is a 41 y.o. male  Chief Complaint  Patient presents with   Shoulder Pain    Patient says he is experiencing L shoulder pain. Patient says he has been to PT, Chiropractor and had Accupuncure. Patient says nothing seems to help. Patient says it is starting to effect his work and sleep pattern. Patient says the Aleve isn't touching it. Patient says the Flexeril is helping, and he is getting some rest.    SHOULDER PAIN Ongoing left shoulder pain for 6 weeks -- has been to PT, acupuncture, and chiropractor for this. Reports nothing is helping.  Continues to work-out, but did take 2 weeks off which made pain worse.  Had a lipoma removed 05/15/22, with shoulder held above head for short period.  Pain is starting to affect mood and sleep.  Aleve does not help pain.  Flexeril helps somewhat.   Duration: weeks Involved shoulder: left Mechanism of injury: unknown Location: posterior Onset:gradual Severity: 7/10  Quality:  dull, aching, stabbing, and throbbing Frequency: intermittent Radiation: no Aggravating factors: lifting, movement, and sleep  Alleviating factors: Flexeril helps sleep  Status: fluctuating Treatments attempted: heat, APAP, and aleve , chiropractor, acupuncture, PT Relief with NSAIDs?:  no Weakness: no Numbness:  occasional tingly feeling in fingers Decreased grip strength: yes Redness: no Swelling: no Bruising: no Fevers: no   Relevant past medical, surgical, family and social history reviewed and updated as indicated. Interim medical history since our last visit reviewed. Allergies and medications reviewed and updated.  Review of Systems  Constitutional:  Negative for activity change, diaphoresis, fatigue and  fever.  Respiratory:  Negative for cough, chest tightness, shortness of breath and wheezing.   Cardiovascular:  Negative for chest pain, palpitations and leg swelling.  Gastrointestinal: Negative.   Musculoskeletal:  Positive for arthralgias.  Neurological: Negative.   Psychiatric/Behavioral: Negative.     Per HPI unless specifically indicated above     Objective:    BP 108/68   Pulse 70   Temp 98.1 F (36.7 C) (Oral)   Ht 5' 10" (1.778 m)   Wt 206 lb 14.4 oz (93.8 kg)   SpO2 98%   BMI 29.69 kg/m   Wt Readings from Last 3 Encounters:  07/18/22 206 lb 14.4 oz (93.8 kg)  07/06/22 215 lb (97.5 kg)  11/08/21 212 lb 6.4 oz (96.3 kg)    Physical Exam Vitals and nursing note reviewed.  Constitutional:      General: He is awake. He is not in acute distress.    Appearance: He is well-developed and well-groomed. He is not ill-appearing or toxic-appearing.  HENT:     Head: Normocephalic and atraumatic.     Right Ear: Hearing normal. No drainage.     Left Ear: Hearing normal. No drainage.  Eyes:     General: Lids are normal.        Right eye: No discharge.        Left eye: No discharge.     Conjunctiva/sclera: Conjunctivae normal.     Pupils: Pupils are equal, round, and reactive to light.  Neck:     Thyroid: No  thyromegaly.     Vascular: No carotid bruit.  Cardiovascular:     Rate and Rhythm: Normal rate and regular rhythm.     Heart sounds: Normal heart sounds, S1 normal and S2 normal. No murmur heard.    No gallop.  Pulmonary:     Effort: Pulmonary effort is normal. No accessory muscle usage or respiratory distress.     Breath sounds: Normal breath sounds.  Abdominal:     General: Bowel sounds are normal.     Palpations: Abdomen is soft.  Musculoskeletal:     Right shoulder: Normal.     Left shoulder: Tenderness (to posterior along trapezius) present. No swelling, bony tenderness or crepitus. Normal range of motion. Decreased strength.     Cervical back: Normal range  of motion and neck supple.     Right lower leg: No edema.     Left lower leg: No edema.  Lymphadenopathy:     Cervical: No cervical adenopathy.  Skin:    General: Skin is warm and dry.     Capillary Refill: Capillary refill takes less than 2 seconds.  Neurological:     Mental Status: He is alert and oriented to person, place, and time.     Deep Tendon Reflexes: Reflexes are normal and symmetric.  Psychiatric:        Attention and Perception: Attention normal.        Mood and Affect: Mood normal.        Speech: Speech normal.        Behavior: Behavior normal. Behavior is cooperative.        Thought Content: Thought content normal.    Results for orders placed or performed in visit on 07/06/22  Lyme Disease Serology w/Reflex  Result Value Ref Range   Lyme Total Antibody EIA Negative Negative  Rocky mtn spotted fvr abs pnl(IgG+IgM)  Result Value Ref Range   RMSF IgG Negative Negative   RMSF IgM 0.41 0.00 - 0.89 index  Alpha-Gal Panel  Result Value Ref Range   Class Description Allergens Comment    IgE (Immunoglobulin E), Serum 5 (L) 6 - 495 IU/mL   O215-IgE Alpha-Gal <0.10 Class 0 kU/L   Beef IgE <0.10 Class 0 kU/L   Pork IgE <0.10 Class 0 kU/L   Allergen Lamb IgE <0.10 Class 0 kU/L  Rheumatoid factor  Result Value Ref Range   Rhuematoid fact SerPl-aCnc <10.0 <14.0 IU/mL  ANA  Result Value Ref Range   Anti Nuclear Antibody (ANA) Negative Negative  ANA+ENA+DNA/DS+Antich+Centr  Result Value Ref Range   ANA Titer 1 Negative    dsDNA Ab 2 0 - 9 IU/mL   ENA RNP Ab <0.2 0.0 - 0.9 AI   ENA SM Ab Ser-aCnc <0.2 0.0 - 0.9 AI   Scleroderma (Scl-70) (ENA) Antibody, IgG <0.2 0.0 - 0.9 AI   ENA SSA (RO) Ab <0.2 0.0 - 0.9 AI   ENA SSB (LA) Ab <0.2 0.0 - 0.9 AI   Chromatin Ab SerPl-aCnc <0.2 0.0 - 0.9 AI   Anti JO-1 <0.2 0.0 - 0.9 AI   Centromere Ab Screen <0.2 0.0 - 0.9 AI   See below: Comment   CK  Result Value Ref Range   Total CK 118 49 - 439 U/L  Uric acid  Result Value  Ref Range   Uric Acid 6.1 3.8 - 8.4 mg/dL  Ehrlichia antibody panel  Result Value Ref Range   E.Chaffeensis ([facility]ME) IgG Negative Neg:<1:64   E. Chaffeensis ([facility]) IgM Titer  Negative Neg:<1:20   HGE IgG Titer Negative Neg:<1:64   HGE IgM Titer Negative Neg:<1:20   Result Comment: Comment   Babesia microti Antibody Panel  Result Value Ref Range   Babesia microti IgG <1:10 Neg:<1:10   Babesia microti IgM <1:10 Neg:<1:10   Result Comment: Comment   Comprehensive metabolic panel  Result Value Ref Range   Glucose 104 (H) 70 - 99 mg/dL   BUN 13 6 - 24 mg/dL   Creatinine, Ser 1.03 0.76 - 1.27 mg/dL   eGFR 92 >59 mL/min/1.73   BUN/Creatinine Ratio 13 9 - 20   Sodium 141 134 - 144 mmol/L   Potassium 4.6 3.5 - 5.2 mmol/L   Chloride 104 96 - 106 mmol/L   CO2 22 20 - 29 mmol/L   Calcium 9.4 8.7 - 10.2 mg/dL   Total Protein 6.7 6.0 - 8.5 g/dL   Albumin 4.7 4.1 - 5.1 g/dL   Globulin, Total 2.0 1.5 - 4.5 g/dL   Albumin/Globulin Ratio 2.4 (H) 1.2 - 2.2   Bilirubin Total 0.5 0.0 - 1.2 mg/dL   Alkaline Phosphatase 63 44 - 121 IU/L   AST 16 0 - 40 IU/L   ALT 18 0 - 44 IU/L  CBC with Differential/Platelet  Result Value Ref Range   WBC 5.9 3.4 - 10.8 x10E3/uL   RBC 4.78 4.14 - 5.80 x10E6/uL   Hemoglobin 14.6 13.0 - 17.7 g/dL   Hematocrit 43.4 37.5 - 51.0 %   MCV 91 79 - 97 fL   MCH 30.5 26.6 - 33.0 pg   MCHC 33.6 31.5 - 35.7 g/dL   RDW 12.1 11.6 - 15.4 %   Platelets 274 150 - 450 x10E3/uL   Neutrophils 57 Not Estab. %   Lymphs 35 Not Estab. %   Monocytes 5 Not Estab. %   Eos 2 Not Estab. %   Basos 1 Not Estab. %   Neutrophils Absolute 3.3 1.4 - 7.0 x10E3/uL   Lymphocytes Absolute 2.1 0.7 - 3.1 x10E3/uL   Monocytes Absolute 0.3 0.1 - 0.9 x10E3/uL   EOS (ABSOLUTE) 0.1 0.0 - 0.4 x10E3/uL   Basophils Absolute 0.0 0.0 - 0.2 x10E3/uL   Immature Granulocytes 0 Not Estab. %   Immature Grans (Abs) 0.0 0.0 - 0.1 x10E3/uL      Assessment & Plan:   Problem List Items Addressed This Visit        Other   Chronic left shoulder pain - Primary    Chronic for over 6 weeks.  Has tried multiple methods for pain, including chiropractor/acupuncture/PT.  At this time will place referral to ortho for further evaluation.  Recommend continue home treatment and will send in steroid taper and Lidocaine patches.  Obtain Voltaren gel over the counter.      Relevant Medications   methylPREDNISolone (MEDROL DOSEPAK) 4 MG TBPK tablet   Other Relevant Orders   Ambulatory referral to Orthopedics     Follow up plan: Return if symptoms worsen or fail to improve.

## 2022-07-30 DIAGNOSIS — M25512 Pain in left shoulder: Secondary | ICD-10-CM | POA: Diagnosis not present

## 2022-08-22 DIAGNOSIS — M25512 Pain in left shoulder: Secondary | ICD-10-CM | POA: Diagnosis not present

## 2022-08-29 DIAGNOSIS — S43432A Superior glenoid labrum lesion of left shoulder, initial encounter: Secondary | ICD-10-CM | POA: Diagnosis not present

## 2022-08-29 DIAGNOSIS — M7542 Impingement syndrome of left shoulder: Secondary | ICD-10-CM | POA: Diagnosis not present

## 2022-10-07 NOTE — Patient Instructions (Signed)

## 2022-10-08 ENCOUNTER — Ambulatory Visit (INDEPENDENT_AMBULATORY_CARE_PROVIDER_SITE_OTHER): Payer: Federal, State, Local not specified - PPO | Admitting: Nurse Practitioner

## 2022-10-08 ENCOUNTER — Encounter: Payer: Self-pay | Admitting: Nurse Practitioner

## 2022-10-08 VITALS — BP 123/82 | HR 72 | Temp 97.9°F | Ht 70.0 in | Wt 207.5 lb

## 2022-10-08 DIAGNOSIS — Z13220 Encounter for screening for lipoid disorders: Secondary | ICD-10-CM

## 2022-10-08 DIAGNOSIS — R7401 Elevation of levels of liver transaminase levels: Secondary | ICD-10-CM

## 2022-10-08 DIAGNOSIS — R7309 Other abnormal glucose: Secondary | ICD-10-CM | POA: Diagnosis not present

## 2022-10-08 DIAGNOSIS — Z136 Encounter for screening for cardiovascular disorders: Secondary | ICD-10-CM

## 2022-10-08 DIAGNOSIS — Z0000 Encounter for general adult medical examination without abnormal findings: Secondary | ICD-10-CM

## 2022-10-08 DIAGNOSIS — E559 Vitamin D deficiency, unspecified: Secondary | ICD-10-CM

## 2022-10-08 DIAGNOSIS — Z113 Encounter for screening for infections with a predominantly sexual mode of transmission: Secondary | ICD-10-CM

## 2022-10-08 DIAGNOSIS — F1021 Alcohol dependence, in remission: Secondary | ICD-10-CM | POA: Diagnosis not present

## 2022-10-08 NOTE — Assessment & Plan Note (Signed)
Recheck today, recommend complete cessation alcohol use.  Consider imaging if higher elevations noted.

## 2022-10-08 NOTE — Assessment & Plan Note (Signed)
Noted on past labs -- A1c 4.9% last check, discussed with patient no diabetes or prediabetes.  Recheck today.

## 2022-10-08 NOTE — Assessment & Plan Note (Addendum)
Monitor closely, at this time stable.  Recommend complete cessation.  Is not taking Naltrexone - did not tolerate.  Recommend attend AA meetings.  Labs today.

## 2022-10-08 NOTE — Progress Notes (Signed)
BP 123/82   Pulse 72   Temp 97.9 F (36.6 C) (Oral)   Ht 5' 10" (1.778 m)   Wt 207 lb 8 oz (94.1 kg)   SpO2 97%   BMI 29.77 kg/m    Subjective:    Patient ID: Eric Kline, male    DOB: 1981-01-18, 41 y.o.   MRN: [id number]  HPI: Eric Kline is a 41 y.o. male presenting on 10/08/2022 for comprehensive medical examination. Current medical complaints include: none  He currently lives with: self Interim Problems from his last visit: no  Would like STD screening today.  The 10-year ASCVD risk score (Arnett DK, et al., 2019) is: 1.7%   Values used to calculate the score:     Age: 58 years     Sex: Male     Is Non-Hispanic African American: No     Diabetic: No     Tobacco smoker: No     Systolic Blood Pressure: 820 mmHg     Is BP treated: No     HDL Cholesterol: 42 mg/dL     Total Cholesterol: 189 mg/dL  Functional Status Survey: Is the patient deaf or have difficulty hearing?: No Does the patient have difficulty seeing, even when wearing glasses/contacts?: No Does the patient have difficulty concentrating, remembering, or making decisions?: No Does the patient have difficulty walking or climbing stairs?: No Does the patient have difficulty dressing or bathing?: No Does the patient have difficulty doing errands alone such as visiting a doctor's office or shopping?: No  FALL RISK:    10/08/2022    1:12 PM 07/18/2022   10:20 AM 07/06/2022    9:40 AM 09/26/2021    3:19 PM 08/16/2020    7:54 AM  [place] in the past year? 0 0 0 1 0  Number falls in past yr: 0 0 0 1 0  Injury with Fall? 0 0 0 1 0  Risk for fall due to : No Fall Risks No Fall Risks No Fall Risks History of fall(s) No Fall Risks  Follow up Falls evaluation completed Falls evaluation completed Falls evaluation completed Education provided Falls evaluation completed       10/08/2022    1:12 PM 07/18/2022   10:21 AM 07/06/2022    9:40 AM 11/08/2021    2:36 PM 09/26/2021    3:08 PM  Depression  screen PHQ 2/9  Decreased Interest 0 _0 Down, Depressed, Hopeless 0 1 2 0 1  PHQ - 2 Score 0 _1 Altered sleeping _2 Tired, decreased energy _3 Change in appetite 2 3 0 2 1  Feeling bad or failure about yourself  0 0 0 0 1  Trouble concentrating _4 Moving slowly or fidgety/restless 0 0 3 0 1  Suicidal thoughts 0 0 0 0 0  PHQ-9 Score _5 Difficult doing work/chores Not difficult at all  Very difficult Somewhat difficult Somewhat difficult    Advanced Directives <no information>  Past Medical History:  Past Medical History:  Diagnosis Date   Allergy    Concussion    x3   Dysplastic nevus 09/07/2019   L mid back paraspinal - moderate   Dysplastic nevus 09/07/2019   L low back 4.5 cm lat to spine - moderate   Dysplastic nevus 06/16/2020   R pectoral lat to areola -  moderate   Dysplastic nevus 06/16/2020   R mid lat bicep - moderate   Dysplastic nevus 12/12/2020   L mid ant thigh - moderate   Dysplastic nevus 01/10/2022   right sup medial buttock, severe Atypia- excised 02/20/22   Iron deficiency anemia    Wears contact lenses     Surgical History:  Past Surgical History:  Procedure Laterality Date   ANKLE ARTHROSCOPY Left 04/2017   COLONOSCOPY WITH PROPOFOL N/A 01/07/2019   Procedure: COLONOSCOPY WITH PROPOFOL;  Surgeon: Lin Landsman, MD;  Location: [facility];  Service: Endoscopy;  Laterality: N/A;   ESOPHAGOGASTRODUODENOSCOPY (EGD) WITH PROPOFOL N/A 01/07/2019   Procedure: ESOPHAGOGASTRODUODENOSCOPY (EGD) WITH BIOPSIES;  Surgeon: Lin Landsman, MD;  Location: [facility];  Service: Endoscopy;  Laterality: N/A;   MENISCUS REPAIR  2002   left     Medications:  Current Outpatient Medications on File Prior to Visit  Medication Sig   cyclobenzaprine (FLEXERIL) 10 MG tablet Take 1 tablet (10 mg total) by mouth 3 (three) times daily as needed for muscle spasms.   lidocaine (LIDODERM) 5 % Place 1 patch  onto the skin daily. Remove & Discard patch within 12 hours or as directed by MD   meloxicam (MOBIC) 15 MG tablet Take 15 mg by mouth daily.   No current facility-administered medications on file prior to visit.   Social History:  Social History   Socioeconomic History   Marital status: Single    Spouse name: Not on file   Number of children: Not on file   Years of education: Not on file   Highest education level: Not on file  Occupational History   Not on file  Tobacco Use   Smoking status: Former   Smokeless tobacco: Former    Types: Chew    Quit date: 2010   Tobacco comments:    smoked socially in [employer] Use: Never used  Substance and Sexual Activity   Alcohol use: No    Comment: pt states he has not drank in the last 5 months    Drug use: No    Comment: pt states he has been clean for the last 5 months- previous marijuana use    Sexual activity: Not Currently  Other Topics Concern   Not on file  Social History Narrative   Not on file   Social Determinants of Health   Financial Resource Strain: Low Risk  (10/08/2022)   Overall Financial Resource Strain (CARDIA)    Difficulty of Paying Living Expenses: Not hard at all  Food Insecurity: No Food Insecurity (10/08/2022)   Hunger Vital Sign    Worried About Running Out of Food in the Last Year: Never true    [place] in the Last Year: Never true  Transportation Needs: No Transportation Needs (10/08/2022)   PRAPARE - Hydrologist (Medical): No    Lack of Transportation (Non-Medical): No  Physical Activity: Insufficiently Active (10/08/2022)   Exercise Vital Sign    Days of Exercise per Week: 4 days    Minutes of Exercise per Session: 30 min  Stress: Stress Concern Present (10/08/2022)   [place]    Feeling of Stress : To some extent  Social Connections: Socially Isolated (10/08/2022)    Social Connection and Isolation Panel [NHANES]    Frequency of Communication with Friends and Family: More than three times a week  Frequency of Social Gatherings with Friends and Family: More than three times a week    Attends Religious Services: Never    Marine scientist or Organizations: No    Attends Archivist Meetings: Never    Marital Status: Never married  Intimate Partner Violence: Not At Risk (10/08/2022)   Humiliation, Afraid, Rape, and Kick questionnaire    Fear of Current or Ex-Partner: No    Emotionally Abused: No    Physically Abused: No    Sexually Abused: No   Social History   Tobacco Use  Smoking Status Former  Smokeless Tobacco Former   Types: Chew   Quit date: 2010  Tobacco Comments   smoked socially in college   Social History   Substance and Sexual Activity  Alcohol Use No   Comment: pt states he has not drank in the last 5 months     Family History:  Family History  Problem Relation Age of Onset   Cancer Mother        skin   Alzheimer's disease Maternal Grandfather     Past medical history, surgical history, medications, allergies, family history and social history reviewed with patient today and changes made to appropriate areas of the chart.   ROS All other ROS negative except what is listed above and in the HPI.      Objective:    BP 123/82   Pulse 72   Temp 97.9 F (36.6 C) (Oral)   Ht _0  (1.778 m)   Wt 207 lb 8 oz (94.1 kg)   SpO2 97%   BMI 29.77 kg/m   Wt Readings from Last 3 Encounters:  10/08/22 207 lb 8 oz (94.1 kg)  07/18/22 206 lb 14.4 oz (93.8 kg)  07/06/22 215 lb (97.5 kg)    Physical Exam Vitals and nursing note reviewed.  Constitutional:      General: He is awake. He is not in acute distress.    Appearance: He is well-developed and well-groomed. He is not ill-appearing or toxic-appearing.  HENT:     Head: Normocephalic and atraumatic.     Right Ear: Hearing, tympanic membrane, ear canal and  external ear normal. No drainage.     Left Ear: Hearing, tympanic membrane, ear canal and external ear normal. No drainage.     Nose: Nose normal.     Mouth/Throat:     Pharynx: Uvula midline.  Eyes:     General: Lids are normal.        Right eye: No discharge.        Left eye: No discharge.     Extraocular Movements: Extraocular movements intact.     Conjunctiva/sclera: Conjunctivae normal.     Pupils: Pupils are equal, round, and reactive to light.     Visual Fields: Right eye visual fields normal and left eye visual fields normal.  Neck:     Thyroid: No thyromegaly.     Vascular: No carotid bruit or JVD.     Trachea: Trachea normal.  Cardiovascular:     Rate and Rhythm: Normal rate and regular rhythm.     Heart sounds: Normal heart sounds, S1 normal and S2 normal. No murmur heard.    No gallop.  Pulmonary:     Effort: Pulmonary effort is normal. No accessory muscle usage or respiratory distress.     Breath sounds: Normal breath sounds.  Abdominal:     General: Bowel sounds are normal.     Palpations: Abdomen is soft.  There is no hepatomegaly or splenomegaly.     Tenderness: There is no abdominal tenderness.  Musculoskeletal:        General: Normal range of motion.     Cervical back: Normal range of motion and neck supple.     Right lower leg: No edema.     Left lower leg: No edema.  Lymphadenopathy:     Head:     Right side of head: No submental, submandibular, tonsillar, preauricular or posterior auricular adenopathy.     Left side of head: No submental, submandibular, tonsillar, preauricular or posterior auricular adenopathy.     Cervical: No cervical adenopathy.  Skin:    General: Skin is warm and dry.     Capillary Refill: Capillary refill takes less than 2 seconds.     Findings: No rash.  Neurological:     Mental Status: He is alert and oriented to person, place, and time.     Gait: Gait is intact.     Deep Tendon Reflexes: Reflexes are normal and symmetric.      Reflex Scores:      Brachioradialis reflexes are 2+ on the right side and 2+ on the left side.      Patellar reflexes are 2+ on the right side and 2+ on the left side. Psychiatric:        Attention and Perception: Attention normal.        Mood and Affect: Mood normal.        Speech: Speech normal.        Behavior: Behavior normal. Behavior is cooperative.        Thought Content: Thought content normal.        Cognition and Memory: Cognition normal.    Results for orders placed or performed in visit on 07/06/22  Lyme Disease Serology w/Reflex  Result Value Ref Range   Lyme Total Antibody EIA Negative Negative  Rocky mtn spotted fvr abs pnl(IgG+IgM)  Result Value Ref Range   RMSF IgG Negative Negative   RMSF IgM 0.41 0.00 - 0.89 index  Alpha-Gal Panel  Result Value Ref Range   Class Description Allergens Comment    IgE (Immunoglobulin E), Serum 5 (L) 6 - 495 IU/mL   O215-IgE Alpha-Gal <0.10 Class 0 kU/L   Beef IgE <0.10 Class 0 kU/L   Pork IgE <0.10 Class 0 kU/L   Allergen Lamb IgE <0.10 Class 0 kU/L  Rheumatoid factor  Result Value Ref Range   Rhuematoid fact SerPl-aCnc <10.0 <14.0 IU/mL  ANA  Result Value Ref Range   Anti Nuclear Antibody (ANA) Negative Negative  ANA+ENA+DNA/DS+Antich+Centr  Result Value Ref Range   ANA Titer 1 Negative    dsDNA Ab 2 0 - 9 IU/mL   ENA RNP Ab <0.2 0.0 - 0.9 AI   ENA SM Ab Ser-aCnc <0.2 0.0 - 0.9 AI   Scleroderma (Scl-70) (ENA) Antibody, IgG <0.2 0.0 - 0.9 AI   ENA SSA (RO) Ab <0.2 0.0 - 0.9 AI   ENA SSB (LA) Ab <0.2 0.0 - 0.9 AI   Chromatin Ab SerPl-aCnc <0.2 0.0 - 0.9 AI   Anti JO-1 <0.2 0.0 - 0.9 AI   Centromere Ab Screen <0.2 0.0 - 0.9 AI   See below: Comment   CK  Result Value Ref Range   Total CK 118 49 - 439 U/L  Uric acid  Result Value Ref Range   Uric Acid 6.1 3.8 - 8.4 mg/dL  Ehrlichia antibody panel  Result Value Ref Range  E.Chaffeensis (HME) IgG Negative Neg:<1:64   E. Chaffeensis (HME) IgM Titer Negative Neg:<1:20    HGE IgG Titer Negative Neg:<1:64   HGE IgM Titer Negative Neg:<1:20   Result Comment: Comment   Babesia microti Antibody Panel  Result Value Ref Range   Babesia microti IgG <1:10 Neg:<1:10   Babesia microti IgM <1:10 Neg:<1:10   Result Comment: Comment   Comprehensive metabolic panel  Result Value Ref Range   Glucose 104 (H) 70 - 99 mg/dL   BUN 13 6 - 24 mg/dL   Creatinine, Ser 1.03 0.76 - 1.27 mg/dL   eGFR 92 >59 mL/min/1.73   BUN/Creatinine Ratio 13 9 - 20   Sodium 141 134 - 144 mmol/L   Potassium 4.6 3.5 - 5.2 mmol/L   Chloride 104 96 - 106 mmol/L   CO2 22 20 - 29 mmol/L   Calcium 9.4 8.7 - 10.2 mg/dL   Total Protein 6.7 6.0 - 8.5 g/dL   Albumin 4.7 4.1 - 5.1 g/dL   Globulin, Total 2.0 1.5 - 4.5 g/dL   Albumin/Globulin Ratio 2.4 (H) 1.2 - 2.2   Bilirubin Total 0.5 0.0 - 1.2 mg/dL   Alkaline Phosphatase 63 44 - 121 IU/L   AST 16 0 - 40 IU/L   ALT 18 0 - 44 IU/L  CBC with Differential/Platelet  Result Value Ref Range   WBC 5.9 3.4 - 10.8 x10E3/uL   RBC 4.78 4.14 - 5.80 x10E6/uL   Hemoglobin 14.6 13.0 - 17.7 g/dL   Hematocrit 43.4 37.5 - 51.0 %   MCV 91 79 - 97 fL   MCH 30.5 26.6 - 33.0 pg   MCHC 33.6 31.5 - 35.7 g/dL   RDW 12.1 11.6 - 15.4 %   Platelets 274 150 - 450 x10E3/uL   Neutrophils 57 Not Estab. %   Lymphs 35 Not Estab. %   Monocytes 5 Not Estab. %   Eos 2 Not Estab. %   Basos 1 Not Estab. %   Neutrophils Absolute 3.3 1.4 - 7.0 x10E3/uL   Lymphocytes Absolute 2.1 0.7 - 3.1 x10E3/uL   Monocytes Absolute 0.3 0.1 - 0.9 x10E3/uL   EOS (ABSOLUTE) 0.1 0.0 - 0.4 x10E3/uL   Basophils Absolute 0.0 0.0 - 0.2 x10E3/uL   Immature Granulocytes 0 Not Estab. %   Immature Grans (Abs) 0.0 0.0 - 0.1 x10E3/uL      Assessment & Plan:   Problem List Items Addressed This Visit       Other   Elevated ALT measurement    Recheck today, recommend complete cessation alcohol use.  Consider imaging if higher elevations noted.      Relevant Orders   CBC with  Differential/Platelet   Comprehensive metabolic panel   Elevated glucose    Noted on past labs -- A1c 4.9% last check, discussed with patient no diabetes or prediabetes.  Recheck today.      Relevant Orders   Comprehensive metabolic panel   HgB V2Z   History of alcoholism ([place]) - Primary    Monitor closely, at this time stable.  Recommend complete cessation.  Is not taking Naltrexone - did not tolerate.  Recommend attend AA meetings.  Labs today.      Relevant Orders   CBC with Differential/Platelet   Comprehensive metabolic panel   TSH   Other Visit Diagnoses     Vitamin D deficiency       History of low levels reported, recheck today and start supplement as needed.   Relevant Orders  VITAMIN D 25 Hydroxy (Vit-D Deficiency, Fractures)   Screening for STD (sexually transmitted disease)       STD screening today per patient request.   Relevant Orders   HIV Antibody (routine testing w rflx)   RPR   HSV 1 and 2 Ab, IgG   GC/Chlamydia Probe Amp   Encounter for lipid screening for cardiovascular disease       Lipid panel on labs today.   Relevant Orders   Comprehensive metabolic panel   Lipid Panel w/o Chol/HDL Ratio   Encounter for annual physical exam       Annual physical today with labs and health maintenance reviewed, discussed with patient.       Discussed aspirin prophylaxis for myocardial infarction prevention and decision was it was not indicated  LABORATORY TESTING:  Health maintenance labs ordered today as discussed above.   IMMUNIZATIONS:   - Tdap: Tetanus vaccination status reviewed: last tetanus booster within 10 years. - Influenza: Refused - Pneumovax: Not applicable - Prevnar: Not applicable - Zostavax vaccine: Not applicable  SCREENING: - Colonoscopy: Not applicable  Discussed with patient purpose of the colonoscopy is to detect colon cancer at curable precancerous or early stages   - AAA Screening: Not applicable  -Hearing Test: Not applicable   -Spirometry: Not applicable   PATIENT COUNSELING:    Sexuality: Discussed sexually transmitted diseases, partner selection, use of condoms, avoidance of unintended pregnancy  and contraceptive alternatives.   Advised to avoid cigarette smoking.  I discussed with the patient that most people either abstain from alcohol or drink within safe limits (<=14/week and <=4 drinks/occasion for males, <=7/weeks and <= 3 drinks/occasion for females) and that the risk for alcohol disorders and other health effects rises proportionally with the number of drinks per week and how often a drinker exceeds daily limits.  Discussed cessation/primary prevention of drug use and availability of treatment for abuse.   Diet: Encouraged to adjust caloric intake to maintain  or achieve ideal body weight, to reduce intake of dietary saturated fat and total fat, to limit sodium intake by avoiding high sodium foods and not adding table salt, and to maintain adequate dietary potassium and calcium preferably from fresh fruits, vegetables, and low-fat dairy products.    Stressed the importance of regular exercise  Injury prevention: Discussed safety belts, safety helmets, smoke detector, smoking near bedding or upholstery.   Dental health: Discussed importance of regular tooth brushing, flossing, and dental visits.   Follow up plan: NEXT PREVENTATIVE PHYSICAL DUE IN 1 YEAR. Return in about 1 year (around 10/09/2023) for Annual physical.

## 2022-10-09 ENCOUNTER — Encounter: Payer: Self-pay | Admitting: Nurse Practitioner

## 2022-10-09 DIAGNOSIS — E559 Vitamin D deficiency, unspecified: Secondary | ICD-10-CM | POA: Insufficient documentation

## 2022-10-09 LAB — LIPID PANEL W/O CHOL/HDL RATIO
Cholesterol, Total: 189 mg/dL (ref 100–199)
HDL: 36 mg/dL — ABNORMAL LOW (ref >39)
LDL Chol Calc (NIH): 91 mg/dL (ref 0–99)
Triglycerides: 374 mg/dL — ABNORMAL HIGH (ref 0–149)
VLDL Cholesterol Cal: 62 mg/dL — ABNORMAL HIGH (ref 5–40)

## 2022-10-09 LAB — HIV ANTIBODY (ROUTINE TESTING W REFLEX): HIV Screen 4th Generation wRfx: NONREACTIVE

## 2022-10-09 LAB — CBC WITH DIFFERENTIAL/PLATELET
Basophils Absolute: 0 10*3/uL (ref 0.0–0.2)
Basos: 0 %
EOS (ABSOLUTE): 0.1 10*3/uL (ref 0.0–0.4)
Eos: 2 %
Hematocrit: 44.2 % (ref 37.5–51.0)
Hemoglobin: 15.1 g/dL (ref 13.0–17.7)
Immature Grans (Abs): 0 10*3/uL (ref 0.0–0.1)
Immature Granulocytes: 0 %
Lymphocytes Absolute: 2.2 10*3/uL (ref 0.7–3.1)
Lymphs: 32 %
MCH: 32.1 pg (ref 26.6–33.0)
MCHC: 34.2 g/dL (ref 31.5–35.7)
MCV: 94 fL (ref 79–97)
Monocytes Absolute: 0.4 10*3/uL (ref 0.1–0.9)
Monocytes: 7 %
Neutrophils Absolute: 4 10*3/uL (ref 1.4–7.0)
Neutrophils: 59 %
Platelets: 285 10*3/uL (ref 150–450)
RBC: 4.71 x10E6/uL (ref 4.14–5.80)
RDW: 12.4 % (ref 11.6–15.4)
WBC: 6.8 10*3/uL (ref 3.4–10.8)

## 2022-10-09 LAB — COMPREHENSIVE METABOLIC PANEL
ALT: 23 IU/L (ref 0–44)
AST: 14 IU/L (ref 0–40)
Albumin/Globulin Ratio: 2.2 (ref 1.2–2.2)
Albumin: 4.7 g/dL (ref 4.1–5.1)
Alkaline Phosphatase: 64 IU/L (ref 44–121)
BUN/Creatinine Ratio: 12 (ref 9–20)
BUN: 13 mg/dL (ref 6–24)
Bilirubin Total: 0.4 mg/dL (ref 0.0–1.2)
CO2: 23 mmol/L (ref 20–29)
Calcium: 9.1 mg/dL (ref 8.7–10.2)
Chloride: 103 mmol/L (ref 96–106)
Creatinine, Ser: 1.06 mg/dL (ref 0.76–1.27)
Globulin, Total: 2.1 g/dL (ref 1.5–4.5)
Glucose: 81 mg/dL (ref 70–99)
Potassium: 4.3 mmol/L (ref 3.5–5.2)
Sodium: 142 mmol/L (ref 134–144)
Total Protein: 6.8 g/dL (ref 6.0–8.5)
eGFR: 89 mL/min/{1.73_m2} (ref >59)

## 2022-10-09 LAB — HSV 1 AND 2 AB, IGG
HSV 1 Glycoprotein G Ab, IgG: 4.88 index — ABNORMAL HIGH (ref 0.00–0.90)
HSV 2 IgG, Type Spec: 1.51 index — ABNORMAL HIGH (ref 0.00–0.90)

## 2022-10-09 LAB — RPR: RPR Ser Ql: NONREACTIVE

## 2022-10-09 LAB — TSH: TSH: 2.16 u[IU]/mL (ref 0.450–4.500)

## 2022-10-09 LAB — HEMOGLOBIN A1C
Est. average glucose Bld gHb Est-mCnc: 105 mg/dL
Hgb A1c MFr Bld: 5.3 % (ref 4.8–5.6)

## 2022-10-09 LAB — VITAMIN D 25 HYDROXY (VIT D DEFICIENCY, FRACTURES): Vit D, 25-Hydroxy: 28.7 ng/mL — ABNORMAL LOW (ref 30.0–100.0)

## 2022-10-09 NOTE — Progress Notes (Signed)
Contacted via MyChart   Good morning Eric Kline, your labs have returned: - Cholesterol levels show elevation in triglycerides -- recommend heavy focus on diet + alcohol reduction for this. - Vitamin D3 level a little low, recommend starting Vitamin D3 2000 units daily for bone health. - Herpes testing is positive for both oral and genital -- this means higher risk for outbreaks although you may never have had one.  We will monitor closely and treat outbreaks if present. - Remainder of labs are all stable.  Any questions? Keep being wonderful!!  Thank you for allowing me to participate in your care.  I appreciate you. Kindest regards, Merita Hawks

## 2022-10-10 LAB — GC/CHLAMYDIA PROBE AMP
Chlamydia trachomatis, NAA: NEGATIVE
Neisseria Gonorrhoeae by PCR: NEGATIVE

## 2022-10-10 NOTE — Progress Notes (Signed)
Contacted via MyChart   Urine testing returned negative:)

## 2022-10-19 ENCOUNTER — Encounter: Payer: Self-pay | Admitting: Nurse Practitioner

## 2022-10-19 MED ORDER — VALACYCLOVIR HCL 1 G PO TABS: 1000.0000 mg | ORAL_TABLET | Freq: Two times a day (BID) | ORAL | 4 refills | Status: AC

## 2022-10-30 NOTE — Patient Instructions (Signed)

## 2022-11-02 ENCOUNTER — Encounter: Payer: Self-pay | Admitting: Nurse Practitioner

## 2022-11-02 ENCOUNTER — Ambulatory Visit: Payer: Federal, State, Local not specified - PPO | Admitting: Nurse Practitioner

## 2022-11-02 VITALS — BP 124/79 | HR 72 | Temp 97.9°F | Ht 70.0 in | Wt 207.5 lb

## 2022-11-02 DIAGNOSIS — F1021 Alcohol dependence, in remission: Secondary | ICD-10-CM | POA: Diagnosis not present

## 2022-11-02 DIAGNOSIS — A6001 Herpesviral infection of penis: Secondary | ICD-10-CM | POA: Diagnosis not present

## 2022-11-02 DIAGNOSIS — N4889 Other specified disorders of penis: Secondary | ICD-10-CM

## 2022-11-02 DIAGNOSIS — N342 Other urethritis: Secondary | ICD-10-CM | POA: Insufficient documentation

## 2022-11-02 DIAGNOSIS — A6000 Herpesviral infection of urogenital system, unspecified: Secondary | ICD-10-CM | POA: Insufficient documentation

## 2022-11-02 LAB — URINALYSIS, ROUTINE W REFLEX MICROSCOPIC
Bilirubin, UA: NEGATIVE
Glucose, UA: NEGATIVE
Ketones, UA: NEGATIVE
Leukocytes,UA: NEGATIVE
Nitrite, UA: NEGATIVE
Protein,UA: NEGATIVE
RBC, UA: NEGATIVE
Specific Gravity, UA: 1.01 (ref 1.005–1.030)
Urobilinogen, Ur: 0.2 mg/dL (ref 0.2–1.0)
pH, UA: 7 (ref 5.0–7.5)

## 2022-11-02 MED ORDER — VALACYCLOVIR HCL 1 G PO TABS: 1000.0000 mg | ORAL_TABLET | Freq: Every day | ORAL | 4 refills | Status: DC

## 2022-11-02 MED ORDER — DOXYCYCLINE MONOHYDRATE 100 MG PO CAPS: 100.0000 mg | ORAL_CAPSULE | Freq: Two times a day (BID) | ORAL | 0 refills | Status: AC

## 2022-11-02 NOTE — Assessment & Plan Note (Signed)
Obtain labs today, UA negative.  Urine sent for gonorrhea and chlamydia.  Start Doxycycline 100 MG BID and adjust based on lab results.  Educated patient on this.

## 2022-11-02 NOTE — Assessment & Plan Note (Signed)
Recent issues with such, we will start suppressive therapy.  Educated him on this.  Valtrex 1000 MG daily.

## 2022-11-02 NOTE — Assessment & Plan Note (Addendum)
Monitor closely, at this time stable.  Recommend complete cessation.  Is not taking Naltrexone - did not tolerate.  Recommend attend AA meetings.  Labs in July.

## 2022-11-02 NOTE — Progress Notes (Signed)
BP 124/79   Pulse 72   Temp 97.9 F (36.6 C) (Oral)   Ht '5\' 10"'$  (1.778 m)   Wt 207 lb 8 oz (94.1 kg)   BMI 29.77 kg/m    Subjective:    Patient ID: Eric Kline, male    DOB: 1981/05/03, 42 y.o.   MRN: [id number]  HPI: Eric Kline is a 42 y.o. male  Chief Complaint  Patient presents with   STI follow up    Follow up, patient states that he is still having itching at the tip of the penis   STD Has known genital herpes, recent treatment with Valtrex, but continues to have itching to penis.  Feels like a intermittent scratching itch inside urethra.   Has currently had alcohol cessation for several weeks. Sexual activity:  In a Monogamous Relationship -- unprotected sex in October -- heterosexual Contraception: no Recent unprotected intercourse: yes History of sexually transmitted diseases: yes Previous sexually transmitted disease screening: yes Lifetime sexual partners: 15-20 Genital lesions: no Penile discharge: no Dysuria: no Swollen lymph nodes: no Fevers: no Rash: no   Relevant past medical, surgical, family and social history reviewed and updated as indicated. Interim medical history since our last visit reviewed. Allergies and medications reviewed and updated.  Review of Systems  Constitutional:  Negative for activity change, diaphoresis, fatigue and fever.  Respiratory:  Negative for cough, chest tightness, shortness of breath and wheezing.   Cardiovascular:  Negative for chest pain, palpitations and leg swelling.  Gastrointestinal: Negative.   Endocrine: Negative for cold intolerance, heat intolerance, polydipsia, polyphagia and polyuria.  Musculoskeletal: Negative.   Skin: Negative.   Neurological:  Negative for dizziness, syncope, weakness, light-headedness, numbness and headaches.  Psychiatric/Behavioral: Negative.     Per HPI unless specifically indicated above     Objective:    BP 124/79   Pulse 72   Temp 97.9 F (36.6 C) (Oral)   Ht '5\' 10"'$   (1.778 m)   Wt 207 lb 8 oz (94.1 kg)   BMI 29.77 kg/m   Wt Readings from Last 3 Encounters:  11/02/22 207 lb 8 oz (94.1 kg)  10/08/22 207 lb 8 oz (94.1 kg)  07/18/22 206 lb 14.4 oz (93.8 kg)    Physical Exam Vitals and nursing note reviewed.  Constitutional:      General: He is awake. He is not in acute distress.    Appearance: He is well-developed and well-groomed. He is not ill-appearing or toxic-appearing.  HENT:     Head: Normocephalic.  Eyes:     General: Lids are normal.     Extraocular Movements: Extraocular movements intact.     Conjunctiva/sclera: Conjunctivae normal.  Neck:     Thyroid: No thyromegaly.     Vascular: No carotid bruit.  Cardiovascular:     Rate and Rhythm: Normal rate and regular rhythm.     Heart sounds: Normal heart sounds.  Pulmonary:     Effort: No accessory muscle usage or respiratory distress.     Breath sounds: Normal breath sounds.  Abdominal:     General: Bowel sounds are normal. There is no distension.     Palpations: Abdomen is soft.     Tenderness: There is no abdominal tenderness.  Musculoskeletal:     Cervical back: Full passive range of motion without pain.     Right lower leg: No edema.     Left lower leg: No edema.  Lymphadenopathy:     Cervical: No cervical adenopathy.  Skin:  General: Skin is warm.     Capillary Refill: Capillary refill takes less than 2 seconds.  Neurological:     Mental Status: He is alert and oriented to person, place, and time.     Deep Tendon Reflexes: Reflexes are normal and symmetric.     Reflex Scores:      Brachioradialis reflexes are 2+ on the right side and 2+ on the left side.      Patellar reflexes are 2+ on the right side and 2+ on the left side. Psychiatric:        Attention and Perception: Attention normal.        Mood and Affect: Mood normal.        Speech: Speech normal.        Behavior: Behavior normal. Behavior is cooperative.        Thought Content: Thought content normal.     Results for orders placed or performed in visit on 10/08/22  GC/Chlamydia Probe Amp   Specimen: Urine   UR  Result Value Ref Range   Chlamydia trachomatis, NAA Negative Negative   Neisseria Gonorrhoeae by PCR Negative Negative  CBC with Differential/Platelet  Result Value Ref Range   WBC 6.8 3.4 - 10.8 x10E3/uL   RBC 4.71 4.14 - 5.80 x10E6/uL   Hemoglobin 15.1 13.0 - 17.7 g/dL   Hematocrit 44.2 37.5 - 51.0 %   MCV 94 79 - 97 fL   MCH 32.1 26.6 - 33.0 pg   MCHC 34.2 31.5 - 35.7 g/dL   RDW 12.4 11.6 - 15.4 %   Platelets 285 150 - 450 x10E3/uL   Neutrophils 59 Not Estab. %   Lymphs 32 Not Estab. %   Monocytes 7 Not Estab. %   Eos 2 Not Estab. %   Basos 0 Not Estab. %   Neutrophils Absolute 4.0 1.4 - 7.0 x10E3/uL   Lymphocytes Absolute 2.2 0.7 - 3.1 x10E3/uL   Monocytes Absolute 0.4 0.1 - 0.9 x10E3/uL   EOS (ABSOLUTE) 0.1 0.0 - 0.4 x10E3/uL   Basophils Absolute 0.0 0.0 - 0.2 x10E3/uL   Immature Granulocytes 0 Not Estab. %   Immature Grans (Abs) 0.0 0.0 - 0.1 x10E3/uL  Comprehensive metabolic panel  Result Value Ref Range   Glucose 81 70 - 99 mg/dL   BUN 13 6 - 24 mg/dL   Creatinine, Ser 1.06 0.76 - 1.27 mg/dL   eGFR 89 >59 mL/min/1.73   BUN/Creatinine Ratio 12 9 - 20   Sodium 142 134 - 144 mmol/L   Potassium 4.3 3.5 - 5.2 mmol/L   Chloride 103 96 - 106 mmol/L   CO2 23 20 - 29 mmol/L   Calcium 9.1 8.7 - 10.2 mg/dL   Total Protein 6.8 6.0 - 8.5 g/dL   Albumin 4.7 4.1 - 5.1 g/dL   Globulin, Total 2.1 1.5 - 4.5 g/dL   Albumin/Globulin Ratio 2.2 1.2 - 2.2   Bilirubin Total 0.4 0.0 - 1.2 mg/dL   Alkaline Phosphatase 64 44 - 121 IU/L   AST 14 0 - 40 IU/L   ALT 23 0 - 44 IU/L  Lipid Panel w/o Chol/HDL Ratio  Result Value Ref Range   Cholesterol, Total 189 100 - 199 mg/dL   Triglycerides 374 (H) 0 - 149 mg/dL   HDL 36 (L) >39 mg/dL   VLDL Cholesterol Cal 62 (H) 5 - 40 mg/dL   LDL Chol Calc (NIH) 91 0 - 99 mg/dL  TSH  Result Value Ref Range   TSH 2.160  0.450 - 4.500  uIU/mL  VITAMIN D 25 Hydroxy (Vit-D Deficiency, Fractures)  Result Value Ref Range   Vit D, 25-Hydroxy 28.7 (L) 30.0 - 100.0 ng/mL  HgB A1c  Result Value Ref Range   Hgb A1c MFr Bld 5.3 4.8 - 5.6 %   Est. average glucose Bld gHb Est-mCnc 105 mg/dL  HIV Antibody (routine testing w rflx)  Result Value Ref Range   HIV Screen 4th Generation wRfx Non Reactive Non Reactive  RPR  Result Value Ref Range   RPR Ser Ql Non Reactive Non Reactive  HSV 1 and 2 Ab, IgG  Result Value Ref Range   HSV 1 Glycoprotein G Ab, IgG 4.88 (H) 0.00 - 0.90 index   HSV 2 IgG, Type Spec 1.51 (H) 0.00 - 0.90 index      Assessment & Plan:   Problem List Items Addressed This Visit       Genitourinary   Genital herpes    Recent issues with such, we will start suppressive therapy.  Educated him on this.  Valtrex 1000 MG daily.      Relevant Medications   valACYclovir (VALTREX) 1000 MG tablet   Urethritis    Obtain labs today, UA negative.  Urine sent for gonorrhea and chlamydia.  Start Doxycycline 100 MG BID and adjust based on lab results.  Educated patient on this.      Relevant Orders   Urinalysis, Routine w reflex microscopic   GC/Chlamydia Probe Amp   HIV antibody (with reflex)     Other   History of alcoholism ([place]) - Primary    Monitor closely, at this time stable.  Recommend complete cessation.  Is not taking Naltrexone - did not tolerate.  Recommend attend AA meetings.  Labs in July.        Follow up plan: Return in about 6 months (around 05/03/2023) for ALCOHOL USE + ELEVATION IN CHOLESTEROL LEVELS.

## 2022-11-03 LAB — HIV ANTIBODY (ROUTINE TESTING W REFLEX): HIV Screen 4th Generation wRfx: NONREACTIVE

## 2022-11-03 NOTE — Progress Notes (Signed)
Contacted via MyChart   Good evening Eric Kline, HIV remains negative.  Waiting on gonorrhea and chlamydia testing to return.  Hope you are starting to feel better.  Have a good evening!!

## 2022-11-07 LAB — GC/CHLAMYDIA PROBE AMP
Chlamydia trachomatis, NAA: NEGATIVE
Neisseria Gonorrhoeae by PCR: NEGATIVE

## 2022-11-07 NOTE — Progress Notes (Signed)
Contacted via MyChart   Urine testing is negative:)

## 2022-11-23 ENCOUNTER — Telehealth (INDEPENDENT_AMBULATORY_CARE_PROVIDER_SITE_OTHER): Payer: Federal, State, Local not specified - PPO | Admitting: Nurse Practitioner

## 2022-11-23 ENCOUNTER — Encounter: Payer: Self-pay | Admitting: Nurse Practitioner

## 2022-11-23 DIAGNOSIS — J069 Acute upper respiratory infection, unspecified: Secondary | ICD-10-CM

## 2022-11-23 MED ORDER — PREDNISONE 20 MG PO TABS: 40.0000 mg | ORAL_TABLET | Freq: Every day | ORAL | 0 refills | Status: AC

## 2022-11-23 NOTE — Progress Notes (Signed)
There were no vitals taken for this visit.   Subjective:    Patient ID: Eric Kline, male    DOB: [birth year], 42 y.o.   MRN: [id number]  HPI: Eric Kline is a 42 y.o. male  Chief Complaint  Patient presents with   Nasal Congestion    Fever, chills, body aches, started Wednesday    This visit was completed via video visit through MyChart due to the restrictions of the COVID-19 pandemic. All issues as above were discussed and addressed. Physical exam was done as above through visual confirmation on video through MyChart. If it was felt that the patient should be evaluated in the office, they were directed there. The patient verbally consented to this visit. Location of the patient: home Location of the provider: work Those involved with this call:  Provider: Marnee Guarneri, DNP CMA: Frazier Butt, [place] Desk/Registration: [employer]  Time spent on call:  21 minutes with patient face to face via video conference. More than 50% of this time was spent in counseling and coordination of care. 15 minutes total spent in review of patient's record and preparation of their chart.  I verified patient identity using two factors (patient name and date of birth). Patient consents verbally to being seen via telemedicine visit today.    UPPER RESPIRATORY TRACT INFECTION Feels like he has been fighting something for a week, then on Wednesday he called in sick due to fever/chills -- 1 to 1 1/2 hours ago fever broke.  Fever with chills and body aches.  Has had some Covid vaccinations.  Covid testing at home was negative.   Fever: yes 101.7 -- then 99 twenty minutes ago Cough: yes Shortness of breath: no Wheezing: no Chest pain: no Chest tightness: yes Chest congestion: yes Nasal congestion: yes Runny nose: no Post nasal drip: yes Sneezing: no Sore throat: no Swollen glands: no Sinus pressure: yes Headache: yes Face pain: no Toothache: no Ear pain: none Ear pressure: yes  bilateral Eyes red/itching:yes Eye drainage/crusting: no  Vomiting: no Rash: no Fatigue: yes Sick contacts: yes -- son was sick Strep contacts: no  Context: stable Recurrent sinusitis: no Relief with OTC cold/cough medications: yes  Treatments attempted: Mucinex    Relevant past medical, surgical, family and social history reviewed and updated as indicated. Interim medical history since our last visit reviewed. Allergies and medications reviewed and updated.  Review of Systems  Constitutional:  Positive for chills, fatigue and fever. Negative for activity change, appetite change and diaphoresis.  HENT:  Positive for congestion, postnasal drip, rhinorrhea and sinus pressure. Negative for ear discharge, ear pain, sinus pain, sneezing, sore throat and voice change.   Respiratory:  Positive for cough. Negative for chest tightness, shortness of breath and wheezing.   Cardiovascular:  Negative for chest pain, palpitations and leg swelling.  Gastrointestinal: Negative.   Neurological: Negative.   Psychiatric/Behavioral: Negative.      Per HPI unless specifically indicated above     Objective:    There were no vitals taken for this visit.  Wt Readings from Last 3 Encounters:  11/02/22 207 lb 8 oz (94.1 kg)  10/08/22 207 lb 8 oz (94.1 kg)  07/18/22 206 lb 14.4 oz (93.8 kg)    Physical Exam Vitals and nursing note reviewed.  Constitutional:      General: He is awake. He is not in acute distress.    Appearance: He is well-developed and well-groomed. He is not ill-appearing or toxic-appearing.  HENT:  Head: Normocephalic.     Right Ear: Hearing normal. No drainage.     Left Ear: Hearing normal. No drainage.  Eyes:     General: Lids are normal.        Right eye: No discharge.        Left eye: No discharge.     Conjunctiva/sclera: Conjunctivae normal.  Pulmonary:     Effort: Pulmonary effort is normal. No accessory muscle usage or respiratory distress.  Musculoskeletal:      Cervical back: Normal range of motion.  Neurological:     Mental Status: He is alert and oriented to person, place, and time.  Psychiatric:        Mood and Affect: Mood normal.        Behavior: Behavior normal. Behavior is cooperative.        Thought Content: Thought content normal.        Judgment: Judgment normal.     Results for orders placed or performed in visit on 11/02/22  GC/Chlamydia Probe Amp   Specimen: Urine   UR  Result Value Ref Range   Chlamydia trachomatis, NAA Negative Negative   Neisseria Gonorrhoeae by PCR Negative Negative  Urinalysis, Routine w reflex microscopic  Result Value Ref Range   Specific Gravity, UA 1.010 1.005 - 1.030   pH, UA 7.0 5.0 - 7.5   Color, UA Yellow Yellow   Appearance Ur Clear Clear   Leukocytes,UA Negative Negative   Protein,UA Negative Negative/Trace   Glucose, UA Negative Negative   Ketones, UA Negative Negative   RBC, UA Negative Negative   Bilirubin, UA Negative Negative   Urobilinogen, Ur 0.2 0.2 - 1.0 mg/dL   Nitrite, UA Negative Negative   Microscopic Examination Comment   HIV antibody (with reflex)  Result Value Ref Range   HIV Screen 4th Generation wRfx Non Reactive Non Reactive      Assessment & Plan:   Problem List Items Addressed This Visit       Respiratory   URI (upper respiratory infection) - Primary    Acute for 3 days with some improvement today, suspect more viral.  Covid was negative at home.  At this time will send in Prednisone 40 MG daily for 5 days.  Continue Mucinex as needed.  Work note sent via [employer].  Recommend: - Increased rest - Increasing Fluids - Acetaminophen / ibuprofen as needed for fever/pain.  - Mucinex.  - Humidifying the air.  If no improvement or ongoing in 5 days then alert provider and abx therapy will be sent in, at this time still early on and not recommended.         I discussed the assessment and treatment plan with the patient. The patient was provided an opportunity to  ask questions and all were answered. The patient agreed with the plan and demonstrated an understanding of the instructions.   The patient was advised to call back or seek an in-person evaluation if the symptoms worsen or if the condition fails to improve as anticipated.   I provided 21+ minutes of time during this encounter.    Follow up plan: Return if symptoms worsen or fail to improve.

## 2022-11-23 NOTE — Patient Instructions (Signed)

## 2022-11-23 NOTE — Assessment & Plan Note (Signed)
Acute for 3 days with some improvement today, suspect more viral.  Covid was negative at home.  At this time will send in Prednisone 40 MG daily for 5 days.  Continue Mucinex as needed.  Work note sent via [employer].  Recommend: - Increased rest - Increasing Fluids - Acetaminophen / ibuprofen as needed for fever/pain.  - Mucinex.  - Humidifying the air.  If no improvement or ongoing in 5 days then alert provider and abx therapy will be sent in, at this time still early on and not recommended.

## 2022-12-12 ENCOUNTER — Ambulatory Visit: Payer: Self-pay | Admitting: *Deleted

## 2022-12-12 NOTE — Telephone Encounter (Signed)
  Chief Complaint: wheezing x 3 weeks Symptoms: wheezing worse upon awakening and better after taking shower, congestion blowing nose, using saline solution . Wheezing comes and goes, worse after eating.  Frequency: 3 weeks  Pertinent Negatives: Patient denies chest pain no difficulty breathing, fever.  Disposition: []$ ED /[]$ Urgent Care (no appt availability in office) / [x]$ Appointment(In office/virtual)/ []$  [facility]/ []$ Home Care/ []$ Refused Recommended Disposition /[]$  Mobile Bus/ []$  Follow-up with PCP Additional Notes:   Appt 12/14/22.    Reason for Disposition  [1] MODERATE longstanding difficulty breathing (e.g., speaks in phrases, SOB even at rest, pulse 100-120) AND [2] SAME as normal  Answer Assessment - Initial Assessment Questions 1. RESPIRATORY STATUS: "Describe your breathing?" (e.g., wheezing, shortness of breath, unable to speak, severe coughing)      Wheezing "feels like breathing through sponge" 2. ONSET: "When did this breathing problem begin?"      3 weeks ago  3. PATTERN "Does the difficult breathing come and go, or has it been constant since it started?"      Comes and goes  4. SEVERITY: "How bad is your breathing?" (e.g., mild, moderate, severe)    - MILD: No SOB at rest, mild SOB with walking, speaks normally in sentences, can lie down, no retractions, pulse < 100.    - MODERATE: SOB at rest, SOB with minimal exertion and prefers to sit, cannot lie down flat, speaks in phrases, mild retractions, audible wheezing, pulse 100-120.    - SEVERE: Very SOB at rest, speaks in single words, struggling to breathe, sitting hunched forward, retractions, pulse > 120      Worse with eating . Random times of wheezing  5. RECURRENT SYMPTOM: "Have you had difficulty breathing before?" If Yes, ask: "When was the last time?" and "What happened that time?"      na 6. CARDIAC HISTORY: "Do you have any history of heart disease?" (e.g., heart attack, angina, bypass  surgery, angioplasty)      na 7. LUNG HISTORY: "Do you have any history of lung disease?"  (e.g., pulmonary embolus, asthma, emphysema)     na 8. CAUSE: "What do you think is causing the breathing problem?"      na 9. OTHER SYMPTOMS: "Do you have any other symptoms? (e.g., dizziness, runny nose, cough, chest pain, fever)     Wheezing comes and goes. Worse upon awakening feels congested. After shower feels better  10. O2 SATURATION MONITOR:  "Do you use an oxygen saturation monitor (pulse oximeter) at home?" If Yes, ask: "What is your reading (oxygen level) today?" "What is your usual oxygen saturation reading?" (e.g., 95%)       na 11. PREGNANCY: "Is there any chance you are pregnant?" "When was your last menstrual period?"       na 12. TRAVEL: "Have you traveled out of the country in the last month?" (e.g., travel history, exposures)       na  Protocols used: Breathing Difficulty-A-AH

## 2022-12-12 NOTE — Telephone Encounter (Signed)
Pt has been scheduled as requested

## 2022-12-14 ENCOUNTER — Ambulatory Visit
Admission: RE | Admit: 2022-12-14 | Discharge: 2022-12-14 | Disposition: A | Discharge status: Home / Self Care | Payer: Federal, State, Local not specified - PPO | Source: Ambulatory Visit | Attending: Nurse Practitioner | Admitting: Nurse Practitioner

## 2022-12-14 ENCOUNTER — Ambulatory Visit: Payer: Federal, State, Local not specified - PPO | Admitting: Nurse Practitioner

## 2022-12-14 ENCOUNTER — Encounter: Payer: Self-pay | Admitting: Nurse Practitioner

## 2022-12-14 ENCOUNTER — Ambulatory Visit
Admission: RE | Admit: 2022-12-14 | Discharge: 2022-12-14 | Disposition: A | Discharge status: Home / Self Care | Payer: Federal, State, Local not specified - PPO | Attending: Nurse Practitioner | Admitting: Nurse Practitioner

## 2022-12-14 VITALS — BP 108/70 | HR 60 | Temp 97.9°F | Ht 70.0 in | Wt 207.3 lb

## 2022-12-14 DIAGNOSIS — R052 Subacute cough: Secondary | ICD-10-CM

## 2022-12-14 DIAGNOSIS — R059 Cough, unspecified: Secondary | ICD-10-CM | POA: Diagnosis not present

## 2022-12-14 MED ORDER — PREDNISONE 20 MG PO TABS: 40.0000 mg | ORAL_TABLET | Freq: Every day | ORAL | 0 refills | Status: AC

## 2022-12-14 MED ORDER — AMOXICILLIN-POT CLAVULANATE 875-125 MG PO TABS: 1.0000 | ORAL_TABLET | Freq: Two times a day (BID) | ORAL | 0 refills | Status: DC

## 2022-12-14 NOTE — Patient Instructions (Signed)
IMAGING LOCATION: [address], [place],  [id number] [phone]   Cough, Adult A cough helps to clear your throat and lungs. It may be a sign of an illness or another condition. A short-term (acute) cough may last 2-3 weeks. A long-term (chronic) cough may last 8 or more weeks. Many things can cause a cough. They include: Illnesses such as: An infection in your throat or lungs. Asthma or other heart or lung problems. Gastroesophageal reflux. This is when acid comes back up from your stomach. Breathing in things that bother (irritate) your lungs. Allergies. Postnasal drip. This is when mucus runs down the back of your throat. Smoking. Some medicines. Follow these instructions at home: Medicines Take over-the-counter and prescription medicines only as told by your doctor. Talk with your doctor before you take cough medicine (cough suppressants). Eating and drinking Do not drink alcohol. Do not drink caffeine. Drink enough fluid to keep your pee (urine) pale yellow. Lifestyle Stay away from cigarette smoke. Do not smoke or use any products that contain nicotine or tobacco. If you need help quitting, ask your doctor. Stay away from things that make you cough. These may include perfume, candles, cleaning products, or campfire smoke. General instructions  Watch for any changes to your cough. Tell your doctor about them. Always cover your mouth when you cough. If the air is dry in your home, use a cool mist vaporizer or humidifier. If your cough is worse at night, try using extra pillows to raise your head up higher while you sleep. Rest as needed. Contact a doctor if: You have new symptoms. Your symptoms get worse. You cough up pus. You have a fever that does not go away. Your cough does not get better after 2-3 weeks. Cough medicine does not help, and you are not sleeping well. You have pain that gets worse or is not helped with medicine. You are losing  weight and do not know why. You have night sweats. Get help right away if: You cough up blood. You have trouble breathing. Your heart is beating very fast. These symptoms may be an emergency. Get help right away. Call 911. Do not wait to see if the symptoms will go away. Do not drive yourself to the hospital. This information is not intended to replace advice given to you by your health care provider. Make sure you discuss any questions you have with your health care provider. Document Revised: 06/08/2022 Document Reviewed: 06/08/2022 Elsevier Patient Education  [place].

## 2022-12-14 NOTE — Assessment & Plan Note (Signed)
Acute for 3 weeks, coarse sounds in RLL.  Will obtain CXR to r/o PNA.  Start Augmentin BID for 7 days and Prednisone 40 MG daily for 5 days -- if PNA present will add on Zpack to regimen for CAP.  Discussed at length with patient.  Recommend: - Increased rest - Increasing Fluids - Acetaminophen as needed for fever/pain.  - Salt water gargling, chloraseptic spray and throat lozenges - Mucinex.  - Humidifying the air.  Return in one week for lung check.

## 2022-12-14 NOTE — Progress Notes (Signed)
Contacted via MyChart   Good morning Eric Kline, your imaging shows no pneumonia!!  Woohoo!!!  [employer] [facility]!! Continue current medications as ordered and we will recheck next week.

## 2022-12-14 NOTE — Progress Notes (Signed)
Acute Office Visit  Subjective:     Patient ID: Eric Kline, male    DOB: Oct 20, 1981, 42 y.o.   MRN: [id number]  Chief Complaint  Patient presents with   Cough    X 3 weeks, patient feels like he is struggling to get in a deep breath   Has been ill since beginning of February, was treated with Prednisone and OTC medications.  No abx therapy at time.  Has ongoing cough and wheezing + fatigue.  Currently taking Mucinex which is offering no benefit.  No inhalers at home.  It is a little better this week then last week.  History of walking pneumonia years ago.  Cough This is a new problem. The current episode started 1 to 4 weeks ago. The problem has been unchanged. The problem occurs every few minutes. The cough is Productive of sputum. Associated symptoms include nasal congestion and wheezing. Pertinent negatives include no chest pain, chills, ear congestion, ear pain, fever, headaches, hemoptysis, myalgias, postnasal drip, rhinorrhea, sore throat, shortness of breath, sweats or weight loss. Nothing aggravates the symptoms. He has tried rest, OTC cough suppressant and oral steroids for the symptoms. The treatment provided mild relief. His past medical history is significant for environmental allergies. There is no history of asthma, COPD, emphysema or pneumonia.   Patient is in today for cough and wheezing.    Review of Systems  Constitutional:  Negative for chills, fever and weight loss.  HENT:  Positive for congestion. Negative for ear pain, postnasal drip, rhinorrhea, sinus pain and sore throat.   Respiratory:  Positive for cough and wheezing. Negative for hemoptysis and shortness of breath.   Cardiovascular:  Negative for chest pain, palpitations and leg swelling.  Gastrointestinal: Negative.   Musculoskeletal:  Negative for myalgias.  Neurological:  Negative for headaches.  Endo/Heme/Allergies:  Positive for environmental allergies.  Psychiatric/Behavioral: Negative.         Objective:    BP 108/70   Pulse 60   Temp 97.9 F (36.6 C) (Oral)   Ht '5\' 10"'$  (1.778 m)   Wt 207 lb 4.8 oz (94 kg)   SpO2 98%   BMI 29.74 kg/m  BP Readings from Last 3 Encounters:  12/14/22 108/70  11/02/22 124/79  10/08/22 123/82   Wt Readings from Last 3 Encounters:  12/14/22 207 lb 4.8 oz (94 kg)  11/02/22 207 lb 8 oz (94.1 kg)  10/08/22 207 lb 8 oz (94.1 kg)      Physical Exam Vitals and nursing note reviewed.  Constitutional:      General: He is awake. He is not in acute distress.    Appearance: He is well-developed and well-groomed. He is not ill-appearing or toxic-appearing.  HENT:     Head: Normocephalic.     Right Ear: Hearing, ear canal and external ear normal. No tenderness. A middle ear effusion is present. Tympanic membrane is not injected or perforated.     Left Ear: Hearing, ear canal and external ear normal. No tenderness. A middle ear effusion is present. Tympanic membrane is not injected or perforated.     Nose: Rhinorrhea present. Rhinorrhea is clear.     Right Sinus: No maxillary sinus tenderness or frontal sinus tenderness.     Left Sinus: No maxillary sinus tenderness or frontal sinus tenderness.     Mouth/Throat:     Mouth: Mucous membranes are moist.     Pharynx: Posterior oropharyngeal erythema (mild) present. No pharyngeal swelling or oropharyngeal exudate.  Eyes:  General: Lids are normal.     Extraocular Movements: Extraocular movements intact.     Conjunctiva/sclera: Conjunctivae normal.  Neck:     Thyroid: No thyromegaly.     Vascular: No carotid bruit.  Cardiovascular:     Rate and Rhythm: Normal rate and regular rhythm.     Heart sounds: Normal heart sounds.  Pulmonary:     Effort: No accessory muscle usage or respiratory distress.     Breath sounds: Examination of the right-lower field reveals rhonchi. Rhonchi present. No decreased breath sounds or wheezing.  Abdominal:     General: Bowel sounds are normal. There is no  distension.     Palpations: Abdomen is soft.     Tenderness: There is no abdominal tenderness.  Musculoskeletal:     Cervical back: Full passive range of motion without pain.     Right lower leg: No edema.     Left lower leg: No edema.  Lymphadenopathy:     Cervical: No cervical adenopathy.  Skin:    General: Skin is warm.     Capillary Refill: Capillary refill takes less than 2 seconds.  Neurological:     Mental Status: He is alert and oriented to person, place, and time.     Deep Tendon Reflexes: Reflexes are normal and symmetric.     Reflex Scores:      Brachioradialis reflexes are 2+ on the right side and 2+ on the left side.      Patellar reflexes are 2+ on the right side and 2+ on the left side. Psychiatric:        Attention and Perception: Attention normal.        Mood and Affect: Mood normal.        Speech: Speech normal.        Behavior: Behavior normal. Behavior is cooperative.        Thought Content: Thought content normal.     No results found for any visits on 12/14/22.      Assessment & Plan:   Problem List Items Addressed This Visit       Other   Subacute cough - Primary    Acute for 3 weeks, coarse sounds in RLL.  Will obtain CXR to r/o PNA.  Start Augmentin BID for 7 days and Prednisone 40 MG daily for 5 days -- if PNA present will add on Zpack to regimen for CAP.  Discussed at length with patient.  Recommend: - Increased rest - Increasing Fluids - Acetaminophen as needed for fever/pain.  - Salt water gargling, chloraseptic spray and throat lozenges - Mucinex.  - Humidifying the air.  Return in one week for lung check.      Relevant Orders   DG Chest 2 View    Meds ordered this encounter  Medications   amoxicillin-clavulanate (AUGMENTIN) 875-125 MG tablet    Sig: Take 1 tablet by mouth 2 (two) times daily for 7 days.    Dispense:  14 tablet    Refill:  0   predniSONE (DELTASONE) 20 MG tablet    Sig: Take 2 tablets (40 mg total) by mouth  daily with breakfast for 5 days.    Dispense:  10 tablet    Refill:  0    Return in about 1 week (around 12/21/2022) for Cough and lung check.  Venita Lick, NP

## 2022-12-16 NOTE — Patient Instructions (Signed)

## 2022-12-21 ENCOUNTER — Ambulatory Visit: Payer: Federal, State, Local not specified - PPO | Admitting: Nurse Practitioner

## 2022-12-21 ENCOUNTER — Encounter: Payer: Self-pay | Admitting: Nurse Practitioner

## 2022-12-21 VITALS — BP 115/69 | HR 76 | Temp 98.1°F | Ht 70.0 in | Wt 208.2 lb

## 2022-12-21 DIAGNOSIS — R052 Subacute cough: Secondary | ICD-10-CM | POA: Diagnosis not present

## 2022-12-21 NOTE — Progress Notes (Signed)
BP 115/69   Pulse 76   Temp 98.1 F (36.7 C) (Oral)   Ht '5\' 10"'$  (1.778 m)   Wt 208 lb 3.2 oz (94.4 kg)   SpO2 96%   BMI 29.87 kg/m    Subjective:    Patient ID: Eric Kline, male    DOB: 09-10-1981, 42 y.o.   MRN: [id number]  HPI: Eric Kline is a 42 y.o. male  Chief Complaint  Patient presents with   Cough    Patient states that cough is nearly gone   Lung Check   COUGH Follow-up for cough today, treated on 12/14/22 for this with Augmentin -- 3 weeks present.  He reports not 100%, but getting better.  Imaging noted no acute findings.  He may consider allergy testing in future.   Duration: weeks Circumstances of initial development of cough: URI Cough severity: mild -- improving, occasional wheezing still Aggravating factors:  nothing Alleviating factors: mucinex, cough syrup, and antibiotics Status:  better Treatments attempted: cold/sinus, mucinex, cough syrup, and antibiotics Wheezing: yes mild Shortness of breath: no Chest pain: no Chest tightness:no Nasal congestion: mild Runny nose: no Postnasal drip: no Frequent throat clearing or swallowing: no Hemoptysis: no Fevers: no Night sweats: no Weight loss: no Heartburn: no  Relevant past medical, surgical, family and social history reviewed and updated as indicated. Interim medical history since our last visit reviewed. Allergies and medications reviewed and updated.  Review of Systems  Constitutional:  Negative for activity change, diaphoresis, fatigue and fever.  HENT:  Positive for congestion (improving). Negative for postnasal drip, rhinorrhea, sinus pressure, sinus pain and sore throat.   Respiratory:  Positive for wheezing (improving). Negative for cough (improved), chest tightness and shortness of breath.   Cardiovascular:  Negative for chest pain, palpitations and leg swelling.  Gastrointestinal:  Negative for abdominal distention, abdominal pain, constipation, diarrhea, nausea and vomiting.   Endocrine: Negative for cold intolerance, heat intolerance, polydipsia, polyphagia and polyuria.  Musculoskeletal: Negative.   Skin: Negative.   Neurological:  Negative for dizziness, syncope, weakness, light-headedness, numbness and headaches.  Psychiatric/Behavioral: Negative.     Per HPI unless specifically indicated above     Objective:    BP 115/69   Pulse 76   Temp 98.1 F (36.7 C) (Oral)   Ht '5\' 10"'$  (1.778 m)   Wt 208 lb 3.2 oz (94.4 kg)   SpO2 96%   BMI 29.87 kg/m   Wt Readings from Last 3 Encounters:  12/21/22 208 lb 3.2 oz (94.4 kg)  12/14/22 207 lb 4.8 oz (94 kg)  11/02/22 207 lb 8 oz (94.1 kg)    Physical Exam Vitals and nursing note reviewed.  Constitutional:      General: He is awake. He is not in acute distress.    Appearance: He is well-developed and well-groomed. He is not ill-appearing or toxic-appearing.  HENT:     Head: Normocephalic.     Right Ear: Hearing, ear canal and external ear normal. No tenderness. No middle ear effusion. Tympanic membrane is not injected or perforated.     Left Ear: Hearing, ear canal and external ear normal. No tenderness.  No middle ear effusion. Tympanic membrane is not injected or perforated.     Nose: Rhinorrhea present. Rhinorrhea is clear.     Right Sinus: No maxillary sinus tenderness or frontal sinus tenderness.     Left Sinus: No maxillary sinus tenderness or frontal sinus tenderness.     Mouth/Throat:     Mouth: Mucous membranes  are moist.     Pharynx: Posterior oropharyngeal erythema (mild) present. No pharyngeal swelling or oropharyngeal exudate.  Eyes:     General: Lids are normal.     Extraocular Movements: Extraocular movements intact.     Conjunctiva/sclera: Conjunctivae normal.  Neck:     Thyroid: No thyromegaly.     Vascular: No carotid bruit.  Cardiovascular:     Rate and Rhythm: Normal rate and regular rhythm.     Heart sounds: Normal heart sounds.  Pulmonary:     Effort: No accessory muscle usage  or respiratory distress.     Breath sounds: Normal breath sounds.  Abdominal:     General: Bowel sounds are normal. There is no distension.     Palpations: Abdomen is soft.     Tenderness: There is no abdominal tenderness.  Musculoskeletal:     Cervical back: Full passive range of motion without pain.     Right lower leg: No edema.     Left lower leg: No edema.  Lymphadenopathy:     Cervical: No cervical adenopathy.  Skin:    General: Skin is warm.     Capillary Refill: Capillary refill takes less than 2 seconds.  Neurological:     Mental Status: He is alert and oriented to person, place, and time.     Deep Tendon Reflexes: Reflexes are normal and symmetric.     Reflex Scores:      Brachioradialis reflexes are 2+ on the right side and 2+ on the left side.      Patellar reflexes are 2+ on the right side and 2+ on the left side. Psychiatric:        Attention and Perception: Attention normal.        Mood and Affect: Mood normal.        Speech: Speech normal.        Behavior: Behavior normal. Behavior is cooperative.        Thought Content: Thought content normal.    Results for orders placed or performed in visit on 11/02/22  GC/Chlamydia Probe Amp   Specimen: Urine   UR  Result Value Ref Range   Chlamydia trachomatis, NAA Negative Negative   Neisseria Gonorrhoeae by PCR Negative Negative  Urinalysis, Routine w reflex microscopic  Result Value Ref Range   Specific Gravity, UA 1.010 1.005 - 1.030   pH, UA 7.0 5.0 - 7.5   Color, UA Yellow Yellow   Appearance Ur Clear Clear   Leukocytes,UA Negative Negative   Protein,UA Negative Negative/Trace   Glucose, UA Negative Negative   Ketones, UA Negative Negative   RBC, UA Negative Negative   Bilirubin, UA Negative Negative   Urobilinogen, Ur 0.2 0.2 - 1.0 mg/dL   Nitrite, UA Negative Negative   Microscopic Examination Comment   HIV antibody (with reflex)  Result Value Ref Range   HIV Screen 4th Generation wRfx Non Reactive  Non Reactive      Assessment & Plan:   Problem List Items Addressed This Visit       Other   Subacute cough - Primary    Acute and improved at this time after abx therapy.  Continue to monitor and recommend return to office if symptoms worsen.        Follow up plan: Return if symptoms worsen or fail to improve.

## 2022-12-21 NOTE — Assessment & Plan Note (Signed)
Acute and improved at this time after abx therapy.  Continue to monitor and recommend return to office if symptoms worsen.

## 2023-01-17 ENCOUNTER — Ambulatory Visit: Payer: Federal, State, Local not specified - PPO | Admitting: Dermatology

## 2023-05-03 ENCOUNTER — Ambulatory Visit: Payer: Federal, State, Local not specified - PPO | Admitting: Nurse Practitioner

## 2023-05-21 ENCOUNTER — Ambulatory Visit: Payer: Federal, State, Local not specified - PPO | Admitting: Dermatology

## 2023-09-03 DIAGNOSIS — M5416 Radiculopathy, lumbar region: Secondary | ICD-10-CM | POA: Diagnosis not present

## 2023-09-12 DIAGNOSIS — M5416 Radiculopathy, lumbar region: Secondary | ICD-10-CM | POA: Diagnosis not present

## 2023-09-27 DIAGNOSIS — M5416 Radiculopathy, lumbar region: Secondary | ICD-10-CM | POA: Diagnosis not present

## 2023-10-05 NOTE — Patient Instructions (Signed)
Be Involved in Caring For Your Health:  Taking Medications When medications are taken as directed, they can greatly improve your health. But if they are not taken as prescribed, they may not work. In some cases, not taking them correctly can be harmful. To help ensure your treatment remains effective and safe, understand your medications and how to take them. Bring your medications to each visit for review by your provider.  Your lab results, notes, and after visit summary will be available on My Chart. We strongly encourage you to use this feature. If lab results are abnormal the clinic will contact you with the appropriate steps. If the clinic does not contact you assume the results are satisfactory. You can always view your results on My Chart. If you have questions regarding your health or results, please contact the clinic during office hours. You can also ask questions on My Chart.  We at [facility] are grateful that you chose [place] to provide your care. We strive to provide evidence-based and compassionate care and are always looking for feedback. If you get a survey from the clinic please complete this so we can hear your opinions.  Healthy Eating, Adult Healthy eating may help you get and keep a healthy body weight, reduce the risk of chronic disease, and live a long and productive life. It is important to follow a healthy eating pattern. Your nutritional and calorie needs should be met mainly by different nutrient-rich foods. What are tips for following this plan? Reading food labels Read labels and choose the following: Reduced or low sodium products. Juices with 100% fruit juice. Foods with low saturated fats (<3 g per serving) and high polyunsaturated and monounsaturated fats. Foods with whole grains, such as whole wheat, cracked wheat, brown rice, and wild rice. Whole grains that are fortified with folic acid. This is recommended for females who are pregnant or who want to  become pregnant. Read labels and do not eat or drink the following: Foods or drinks with added sugars. These include foods that contain brown sugar, corn sweetener, corn syrup, dextrose, fructose, glucose, high-fructose corn syrup, honey, invert sugar, lactose, malt syrup, maltose, molasses, raw sugar, sucrose, trehalose, or turbinado sugar. Limit your intake of added sugars to less than 10% of your total daily calories. Do not eat more than the following amounts of added sugar per day: 6 teaspoons (25 g) for females. 9 teaspoons (38 g) for males. Foods that contain processed or refined starches and grains. Refined grain products, such as white flour, degermed cornmeal, white bread, and white rice. Shopping Choose nutrient-rich snacks, such as vegetables, whole fruits, and nuts. Avoid high-calorie and high-sugar snacks, such as potato chips, fruit snacks, and candy. Use oil-based dressings and spreads on foods instead of solid fats such as butter, margarine, sour cream, or cream cheese. Limit pre-made sauces, mixes, and "instant" products such as flavored rice, instant noodles, and ready-made pasta. Try more plant-protein sources, such as tofu, tempeh, black beans, edamame, lentils, nuts, and seeds. Explore eating plans such as the Mediterranean diet or vegetarian diet. Try heart-healthy dips made with beans and healthy fats like hummus and guacamole. Vegetables go great with these. Cooking Use oil to saut or stir-fry foods instead of solid fats such as butter, margarine, or lard. Try baking, boiling, grilling, or broiling instead of frying. Remove the fatty part of meats before cooking. Steam vegetables in water or broth. Meal planning  At meals, imagine dividing your plate into fourths: One-half of  your plate is fruits and vegetables. One-fourth of your plate is whole grains. One-fourth of your plate is protein, especially lean meats, poultry, eggs, tofu, beans, or nuts. Include low-fat  dairy as part of your daily diet. Lifestyle Choose healthy options in all settings, including home, work, school, restaurants, or stores. Prepare your food safely: Wash your hands after handling raw meats. Where you prepare food, keep surfaces clean by regularly washing with hot, soapy water. Keep raw meats separate from ready-to-eat foods, such as fruits and vegetables. Cook seafood, meat, poultry, and eggs to the recommended temperature. Get a food thermometer. Store foods at safe temperatures. In general: Keep cold foods at 48F (4.4C) or below. Keep hot foods at 148F (60C) or above. Keep your freezer at [facility] (-17.8C) or below. Foods are not safe to eat if they have been between the temperatures of 40-148F (4.4-60C) for more than 2 hours. What foods should I eat? Fruits Aim to eat 1-2 cups of fresh, canned (in natural juice), or frozen fruits each day. One cup of fruit equals 1 small apple, 1 large banana, 8 large strawberries, 1 cup (237 g) canned fruit,  cup (82 g) dried fruit, or 1 cup (240 mL) 100% juice. Vegetables Aim to eat 2-4 cups of fresh and frozen vegetables each day, including different varieties and colors. One cup of vegetables equals 1 cup (91 g) broccoli or cauliflower florets, 2 medium carrots, 2 cups (150 g) raw, leafy greens, 1 large tomato, 1 large bell pepper, 1 large sweet potato, or 1 medium white potato. Grains Aim to eat 5-10 ounce-equivalents of whole grains each day. Examples of 1 ounce-equivalent of grains include 1 slice of bread, 1 cup (40 g) ready-to-eat cereal, 3 cups (24 g) popcorn, or  cup (93 g) cooked rice. Meats and other proteins Try to eat 5-7 ounce-equivalents of protein each day. Examples of 1 ounce-equivalent of protein include 1 egg,  oz nuts (12 almonds, 24 pistachios, or 7 walnut halves), 1/4 cup (90 g) cooked beans, 6 tablespoons (90 g) hummus or 1 tablespoon (16 g) peanut butter. A cut of meat or fish that is the size of a deck of  cards is about 3-4 ounce-equivalents (85 g). Of the protein you eat each week, try to have at least 8 sounce (227 g) of seafood. This is about 2 servings per week. This includes salmon, trout, herring, sardines, and anchovies. Dairy Aim to eat 3 cup-equivalents of fat-free or low-fat dairy each day. Examples of 1 cup-equivalent of dairy include 1 cup (240 mL) milk, 8 ounces (250 g) yogurt, 1 ounces (44 g) natural cheese, or 1 cup (240 mL) fortified soy milk. Fats and oils Aim for about 5 teaspoons (21 g) of fats and oils per day. Choose monounsaturated fats, such as canola and olive oils, mayonnaise made with olive oil or avocado oil, avocados, peanut butter, and most nuts, or polyunsaturated fats, such as sunflower, corn, and soybean oils, walnuts, pine nuts, sesame seeds, sunflower seeds, and flaxseed. Beverages Aim for 6 eight-ounce glasses of water per day. Limit coffee to 3-5 eight-ounce cups per day. Limit caffeinated beverages that have added calories, such as soda and energy drinks. If you drink alcohol: Limit how much you have to: 0-1 drink a day if you are male. 0-2 drinks a day if you are male. Know how much alcohol is in your drink. In the [place] one drink is one 12 oz bottle of beer (355 mL), one 5 oz glass of wine (  148 mL), or one 1 oz glass of hard liquor (44 mL). Seasoning and other foods Try not to add too much salt to your food. Try using herbs and spices instead of salt. Try not to add sugar to food. This information is based on [place] nutrition guidelines. To learn more, visit [employer]. Exact amounts may vary. You may need different amounts. This information is not intended to replace advice given to you by your health care provider. Make sure you discuss any questions you have with your health care provider. Document Revised: 07/09/2022 Document Reviewed: 07/09/2022 Elsevier Patient Education  2024 [employer].

## 2023-10-07 DIAGNOSIS — M5451 Vertebrogenic low back pain: Secondary | ICD-10-CM | POA: Diagnosis not present

## 2023-10-10 ENCOUNTER — Encounter: Payer: Self-pay | Admitting: Nurse Practitioner

## 2023-10-10 ENCOUNTER — Ambulatory Visit (INDEPENDENT_AMBULATORY_CARE_PROVIDER_SITE_OTHER): Payer: Federal, State, Local not specified - PPO | Admitting: Nurse Practitioner

## 2023-10-10 VITALS — BP 123/74 | HR 70 | Temp 98.1°F | Ht 70.1 in | Wt 215.0 lb

## 2023-10-10 DIAGNOSIS — R7401 Elevation of levels of liver transaminase levels: Secondary | ICD-10-CM

## 2023-10-10 DIAGNOSIS — E559 Vitamin D deficiency, unspecified: Secondary | ICD-10-CM | POA: Diagnosis not present

## 2023-10-10 DIAGNOSIS — Z136 Encounter for screening for cardiovascular disorders: Secondary | ICD-10-CM

## 2023-10-10 DIAGNOSIS — R7309 Other abnormal glucose: Secondary | ICD-10-CM

## 2023-10-10 DIAGNOSIS — M5126 Other intervertebral disc displacement, lumbar region: Secondary | ICD-10-CM | POA: Insufficient documentation

## 2023-10-10 DIAGNOSIS — Z0000 Encounter for general adult medical examination without abnormal findings: Secondary | ICD-10-CM

## 2023-10-10 DIAGNOSIS — F322 Major depressive disorder, single episode, severe without psychotic features: Secondary | ICD-10-CM | POA: Insufficient documentation

## 2023-10-10 DIAGNOSIS — F1021 Alcohol dependence, in remission: Secondary | ICD-10-CM

## 2023-10-10 DIAGNOSIS — D508 Other iron deficiency anemias: Secondary | ICD-10-CM

## 2023-10-10 DIAGNOSIS — Z13220 Encounter for screening for lipoid disorders: Secondary | ICD-10-CM

## 2023-10-10 MED ORDER — CYCLOBENZAPRINE HCL 10 MG PO TABS: 10.0000 mg | ORAL_TABLET | Freq: Three times a day (TID) | ORAL | 0 refills | Status: DC | PRN

## 2023-10-10 NOTE — Assessment & Plan Note (Signed)
Ongoing, to take supplement daily.  Continue this regimen and recheck levels today.

## 2023-10-10 NOTE — Assessment & Plan Note (Signed)
History of low levels -- recheck CBC, iron, ferritin today.

## 2023-10-10 NOTE — Progress Notes (Signed)
z  BP 123/74   Pulse 70   Temp 98.1 F (36.7 C) (Oral)   Ht 5' 10.1" (1.781 m)   Wt 215 lb (97.5 kg)   SpO2 98%   BMI 30.76 kg/m    Subjective:    Patient ID: Eric Kline, male    DOB: May 25, 1981, 42 y.o.   MRN: [id number]  HPI: Eric Kline is a 42 y.o. male presenting on 10/10/2023 for comprehensive medical examination. Current medical complaints include: none  He currently lives with: self Interim Problems from his last visit: no  Following with [facility] with herniated disc L4-L5 and spondylosis. Reports he started drinking alcohol again 3 weeks ago.  Is drinking 3-6 beers a day to help self medicate and help pain.  Ortho has not provided any pain medications.  Going for injection tomorrow.  Depression and anxiety scores are elevated due to his recent back issues.    The 10-year ASCVD risk score (Arnett DK, et al., 2019) is: 2.3%   Values used to calculate the score:     Age: 17 years     Sex: Male     Is Non-Hispanic African American: No     Diabetic: No     Tobacco smoker: No     Systolic Blood Pressure: 123 mmHg     Is BP treated: No     HDL Cholesterol: 36 mg/dL     Total Cholesterol: 189 mg/dL  Functional Status Survey: Is the patient deaf or have difficulty hearing?: No Does the patient have difficulty seeing, even when wearing glasses/contacts?: No Does the patient have difficulty concentrating, remembering, or making decisions?: No Does the patient have difficulty walking or climbing stairs?: No Does the patient have difficulty dressing or bathing?: No Does the patient have difficulty doing errands alone such as visiting a doctor's office or shopping?: No  FALL RISK:    10/10/2023    8:12 AM 12/21/2022    9:04 AM 12/14/2022    8:17 AM 11/23/2022   10:21 AM 10/08/2022    1:12 PM  Fall Risk   Falls in the past year? 0 0 0 0 0  Number falls in past yr: 0 0 0 0 0  Injury with Fall? 0 0 0 0 0  Risk for fall due to : No Fall Risks No Fall Risks No Fall  Risks No Fall Risks No Fall Risks  Follow up Falls evaluation completed Falls evaluation completed Falls evaluation completed Falls evaluation completed Falls evaluation completed       10/10/2023    8:13 AM 12/21/2022    9:04 AM 12/14/2022    8:17 AM 11/23/2022   10:21 AM 11/02/2022    8:30 AM  Depression screen PHQ 2/9  Decreased Interest 3 1 1  0 0  Down, Depressed, Hopeless 3 0 0 0 1  PHQ - 2 Score 6 1 1  0 1  Altered sleeping 3 1 1  0 1  Tired, decreased energy 3 1 2  0 2  Change in appetite 3 1 1  0 1  Feeling bad or failure about yourself  1 0 0 0 1  Trouble concentrating 3 1 1  0 1  Moving slowly or fidgety/restless 3 1 0 0 1  Suicidal thoughts 0 0 0 0 0  PHQ-9 Score 22 6 6  0 8  Difficult doing work/chores Extremely dIfficult Somewhat difficult Not difficult at all         10/10/2023    8:13 AM 12/21/2022    9:04  AM 12/14/2022    8:18 AM 11/23/2022   10:21 AM  GAD 7 : Generalized Anxiety Score  Nervous, Anxious, on Edge 3 1 1  0  Control/stop worrying 2 0 0 0  Worry too much - different things 2 0 0 0  Trouble relaxing 3 1 0 0  Restless 3 1 0 0  Easily annoyed or irritable 3 1 1  0  Afraid - awful might happen 3 0 0 0  Total GAD 7 Score 19 4 2  0  Anxiety Difficulty Extremely difficult Somewhat difficult Not difficult at all    Past Medical History:  Past Medical History:  Diagnosis Date   Allergy    Concussion    x3   Dysplastic nevus 09/07/2019   L mid back paraspinal - moderate   Dysplastic nevus 09/07/2019   L low back 4.5 cm lat to spine - moderate   Dysplastic nevus 06/16/2020   R pectoral lat to areola - moderate   Dysplastic nevus 06/16/2020   R mid lat bicep - moderate   Dysplastic nevus 12/12/2020   L mid ant thigh - moderate   Dysplastic nevus 01/10/2022   right sup medial buttock, severe Atypia- excised 02/20/22   Iron deficiency anemia    Wears contact lenses     Surgical History:  Past Surgical History:  Procedure Laterality Date   ANKLE ARTHROSCOPY  Left 04/2017   COLONOSCOPY WITH PROPOFOL N/A 01/07/2019   Procedure: COLONOSCOPY WITH PROPOFOL;  Surgeon: Toney Reil, MD;  Location: [facility];  Service: Endoscopy;  Laterality: N/A;   ESOPHAGOGASTRODUODENOSCOPY (EGD) WITH PROPOFOL N/A 01/07/2019   Procedure: ESOPHAGOGASTRODUODENOSCOPY (EGD) WITH BIOPSIES;  Surgeon: Toney Reil, MD;  Location: [facility];  Service: Endoscopy;  Laterality: N/A;   MENISCUS REPAIR  2002   left    Medications:  Current Outpatient Medications on File Prior to Visit  Medication Sig   valACYclovir (VALTREX) 1000 MG tablet Take 1 tablet (1,000 mg total) by mouth daily.   No current facility-administered medications on file prior to visit.   Social History:  Social History   Socioeconomic History   Marital status: Single    Spouse name: Not on file   Number of children: Not on file   Years of education: Not on file   Highest education level: Not on file  Occupational History   Not on file  Tobacco Use   Smoking status: Former   Smokeless tobacco: Former    Types: Chew    Quit date: 2010   Tobacco comments:    smoked socially in college  Vaping Use   Vaping status: Never Used  Substance and Sexual Activity   Alcohol use: Yes    Alcohol/week: 20.0 standard drinks of alcohol    Types: 20 Standard drinks or equivalent per week   Drug use: No    Comment: pt states he has been clean for the last 5 months- previous marijuana use    Sexual activity: Not Currently  Other Topics Concern   Not on file  Social History Narrative   Not on file   Social Drivers of Health   Financial Resource Strain: Low Risk  (10/08/2022)   Overall Financial Resource Strain (CARDIA)    Difficulty of Paying Living Expenses: Not hard at all  Food Insecurity: No Food Insecurity (10/08/2022)   Hunger Vital Sign    Worried About Running Out of Food in the Last Year: Never true    Ran Out of Food in  the Last Year: Never true  Transportation  Needs: No Transportation Needs (10/08/2022)   PRAPARE - Administrator, Civil Service (Medical): No    Lack of Transportation (Non-Medical): No  Physical Activity: Insufficiently Active (10/08/2022)   Exercise Vital Sign    Days of Exercise per Week: 4 days    Minutes of Exercise per Session: 30 min  Stress: Stress Concern Present (10/08/2022)   [employer] of Occupational Health - Occupational Stress Questionnaire    Feeling of Stress : To some extent  Social Connections: Socially Isolated (10/08/2022)   Social Connection and Isolation Panel [NHANES]    Frequency of Communication with Friends and Family: More than three times a week    Frequency of Social Gatherings with Friends and Family: More than three times a week    Attends Religious Services: Never    Database administrator or Organizations: No    Attends Banker Meetings: Never    Marital Status: Never married  Intimate Partner Violence: Not At Risk (10/08/2022)   Humiliation, Afraid, Rape, and Kick questionnaire    Fear of Current or Ex-Partner: No    Emotionally Abused: No    Physically Abused: No    Sexually Abused: No   Social History   Tobacco Use  Smoking Status Former  Smokeless Tobacco Former   Types: Chew   Quit date: 2010  Tobacco Comments   smoked socially in college   Social History   Substance and Sexual Activity  Alcohol Use Yes   Alcohol/week: 20.0 standard drinks of alcohol   Types: 20 Standard drinks or equivalent per week   Family History:  Family History  Problem Relation Age of Onset   Cancer Mother        skin   Alzheimer's disease Maternal Grandfather     Past medical history, surgical history, medications, allergies, family history and social history reviewed with patient today and changes made to appropriate areas of the chart.   ROS All other ROS negative except what is listed above and in the HPI.      Objective:    BP 123/74   Pulse 70    Temp 98.1 F (36.7 C) (Oral)   Ht 5' 10.1" (1.781 m)   Wt 215 lb (97.5 kg)   SpO2 98%   BMI 30.76 kg/m   Wt Readings from Last 3 Encounters:  10/10/23 215 lb (97.5 kg)  12/21/22 208 lb 3.2 oz (94.4 kg)  12/14/22 207 lb 4.8 oz (94 kg)    Physical Exam Vitals and nursing note reviewed.  Constitutional:      General: He is awake. He is not in acute distress.    Appearance: Normal appearance. He is well-developed and well-groomed. He is not ill-appearing or toxic-appearing.  HENT:     Head: Normocephalic and atraumatic.     Right Ear: Hearing, tympanic membrane, ear canal and external ear normal. No drainage.     Left Ear: Hearing, tympanic membrane, ear canal and external ear normal. No drainage.     Nose: Nose normal.     Mouth/Throat:     Pharynx: Uvula midline.  Eyes:     General: Lids are normal.        Right eye: No discharge.        Left eye: No discharge.     Extraocular Movements: Extraocular movements intact.     Conjunctiva/sclera: Conjunctivae normal.     Pupils: Pupils are equal, round, and reactive  to light.     Visual Fields: Right eye visual fields normal and left eye visual fields normal.  Neck:     Thyroid: No thyromegaly.     Vascular: No carotid bruit or JVD.     Trachea: Trachea normal.  Cardiovascular:     Rate and Rhythm: Normal rate and regular rhythm.     Heart sounds: Normal heart sounds, S1 normal and S2 normal. No murmur heard.    No gallop.  Pulmonary:     Effort: Pulmonary effort is normal. No accessory muscle usage or respiratory distress.     Breath sounds: Normal breath sounds.  Abdominal:     General: Bowel sounds are normal.     Palpations: Abdomen is soft. There is no hepatomegaly or splenomegaly.     Tenderness: There is no abdominal tenderness.  Musculoskeletal:        General: Normal range of motion.     Cervical back: Normal range of motion and neck supple.     Right lower leg: No edema.     Left lower leg: No edema.   Lymphadenopathy:     Head:     Right side of head: No submental, submandibular, tonsillar, preauricular or posterior auricular adenopathy.     Left side of head: No submental, submandibular, tonsillar, preauricular or posterior auricular adenopathy.     Cervical: No cervical adenopathy.  Skin:    General: Skin is warm and dry.     Capillary Refill: Capillary refill takes less than 2 seconds.     Findings: No rash.  Neurological:     Mental Status: He is alert and oriented to person, place, and time.     Gait: Gait is intact.     Deep Tendon Reflexes: Reflexes are normal and symmetric.     Reflex Scores:      Brachioradialis reflexes are 2+ on the right side and 2+ on the left side.      Patellar reflexes are 2+ on the right side and 2+ on the left side. Psychiatric:        Attention and Perception: Attention normal.        Mood and Affect: Mood normal.        Speech: Speech normal.        Behavior: Behavior normal. Behavior is cooperative.        Thought Content: Thought content normal.        Cognition and Memory: Cognition normal.    Results for orders placed or performed in visit on 11/02/22  GC/Chlamydia Probe Amp   Collection Time: 11/02/22  8:28 AM   Specimen: Urine   UR  Result Value Ref Range   Chlamydia trachomatis, NAA Negative Negative   Neisseria Gonorrhoeae by PCR Negative Negative  Urinalysis, Routine w reflex microscopic   Collection Time: 11/02/22  8:28 AM  Result Value Ref Range   Specific Gravity, UA 1.010 1.005 - 1.030   pH, UA 7.0 5.0 - 7.5   Color, UA Yellow Yellow   Appearance Ur Clear Clear   Leukocytes,UA Negative Negative   Protein,UA Negative Negative/Trace   Glucose, UA Negative Negative   Ketones, UA Negative Negative   RBC, UA Negative Negative   Bilirubin, UA Negative Negative   Urobilinogen, Ur 0.2 0.2 - 1.0 mg/dL   Nitrite, UA Negative Negative   Microscopic Examination Comment   HIV antibody (with reflex)   Collection Time: 11/02/22   8:31 AM  Result Value Ref Range   HIV  Screen 4th Generation wRfx Non Reactive Non Reactive      Assessment & Plan:   Problem List Items Addressed This Visit       Musculoskeletal and Integument   Lumbar herniated disc   Following with [facility].  To L4-L5 with spondylosis.  Continue collaboration with [facility], goes for steroid injection tomorrow.  Will send in Flexeril to help with pain until then.  He is aware not to take with alcohol + no driving or working while using medication. PHQ and GAD scores are elevated due to pain, but he denies SI/HI.        Other   Elevated ALT measurement   Recheck today, recommend complete cessation alcohol use.  Consider imaging if higher elevations noted.  He has been back to drinking for a few weeks due to back pain, so may be some elevations on this check.      Relevant Orders   Comprehensive metabolic panel   Elevated glucose   Noted on past labs -- A1c 5.3% last check, discussed with patient no diabetes or prediabetes.  Recheck today.      Relevant Orders   Comprehensive metabolic panel   HgB A1c   History of alcoholism (HCC) - Primary   Exacerbated at present.  Monitor closely, did return to alcohol use 3 weeks ago due to back pain, but goes for steroid injection tomorrow which may help pain and help him reduce back down to no alcohol use.  Recommend complete cessation.  Is not taking Naltrexone - did not tolerate.  Recommend attend AA meetings.  Labs today.      Relevant Orders   Comprehensive metabolic panel   TSH   Iron deficiency anemia   History of low levels -- recheck CBC, iron, ferritin today.      Relevant Orders   CBC with Differential/Platelet   Ferritin   Iron   Vitamin D deficiency   Ongoing, to take supplement daily.  Continue this regimen and recheck levels today.      Relevant Orders   VITAMIN D 25 Hydroxy (Vit-D Deficiency, Fractures)   Other Visit Diagnoses       Encounter for lipid screening for  cardiovascular disease       Lipid panel today.   Relevant Orders   Lipid Panel w/o Chol/HDL Ratio     Encounter for annual physical exam       Annual physical today with labs and health maintenance reviewed, discussed with patient.       Discussed aspirin prophylaxis for myocardial infarction prevention and decision was it was not indicated  LABORATORY TESTING:  Health maintenance labs ordered today as discussed above.   IMMUNIZATIONS:   - Tdap: Tetanus vaccination status reviewed: last tetanus booster within 10 years. - Influenza: Refused - has adverse reactions to these - Pneumovax: Not applicable - Prevnar: Not applicable - Zostavax vaccine: Not applicable - Covid: initial ones only  SCREENING: - Colonoscopy: Not applicable  Discussed with patient purpose of the colonoscopy is to detect colon cancer at curable precancerous or early stages   - AAA Screening: Not applicable  -Hearing Test: Not applicable  -Spirometry: Not applicable   PATIENT COUNSELING:    Sexuality: Discussed sexually transmitted diseases, partner selection, use of condoms, avoidance of unintended pregnancy  and contraceptive alternatives.   Advised to avoid cigarette smoking.  I discussed with the patient that most people either abstain from alcohol or drink within safe limits (<=14/week and <=4 drinks/occasion for males, <=  7/weeks and <= 3 drinks/occasion for females) and that the risk for alcohol disorders and other health effects rises proportionally with the number of drinks per week and how often a drinker exceeds daily limits.  Discussed cessation/primary prevention of drug use and availability of treatment for abuse.   Diet: Encouraged to adjust caloric intake to maintain  or achieve ideal body weight, to reduce intake of dietary saturated fat and total fat, to limit sodium intake by avoiding high sodium foods and not adding table salt, and to maintain adequate dietary potassium and calcium  preferably from fresh fruits, vegetables, and low-fat dairy products.    Stressed the importance of regular exercise  Injury prevention: Discussed safety belts, safety helmets, smoke detector, smoking near bedding or upholstery.   Dental health: Discussed importance of regular tooth brushing, flossing, and dental visits.   Follow up plan: NEXT PREVENTATIVE PHYSICAL DUE IN 1 YEAR. Return in about 1 year (around 10/09/2024) for Annual Physical.

## 2023-10-10 NOTE — Assessment & Plan Note (Signed)
Exacerbated at present.  Monitor closely, did return to alcohol use 3 weeks ago due to back pain, but goes for steroid injection tomorrow which may help pain and help him reduce back down to no alcohol use.  Recommend complete cessation.  Is not taking Naltrexone - did not tolerate.  Recommend attend AA meetings.  Labs today.

## 2023-10-10 NOTE — Assessment & Plan Note (Signed)
Recheck today, recommend complete cessation alcohol use.  Consider imaging if higher elevations noted.  He has been back to drinking for a few weeks due to back pain, so may be some elevations on this check.

## 2023-10-10 NOTE — Assessment & Plan Note (Addendum)
Following with [facility].  To L4-L5 with spondylosis.  Continue collaboration with [facility], goes for steroid injection tomorrow.  Will send in Flexeril to help with pain until then.  He is aware not to take with alcohol + no driving or working while using medication. PHQ and GAD scores are elevated due to pain, but he denies SI/HI.

## 2023-10-10 NOTE — Assessment & Plan Note (Signed)
Noted on past labs -- A1c 5.3% last check, discussed with patient no diabetes or prediabetes.  Recheck today.

## 2023-10-11 DIAGNOSIS — M5416 Radiculopathy, lumbar region: Secondary | ICD-10-CM | POA: Diagnosis not present

## 2023-10-11 LAB — CBC WITH DIFFERENTIAL/PLATELET
Basophils Absolute: 0 10*3/uL (ref 0.0–0.2)
Basos: 0 %
EOS (ABSOLUTE): 0.2 10*3/uL (ref 0.0–0.4)
Eos: 3 %
Hematocrit: 47.5 % (ref 37.5–51.0)
Hemoglobin: 15.7 g/dL (ref 13.0–17.7)
Immature Grans (Abs): 0 10*3/uL (ref 0.0–0.1)
Immature Granulocytes: 0 %
Lymphocytes Absolute: 2.5 10*3/uL (ref 0.7–3.1)
Lymphs: 28 %
MCH: 31.5 pg (ref 26.6–33.0)
MCHC: 33.1 g/dL (ref 31.5–35.7)
MCV: 95 fL (ref 79–97)
Monocytes Absolute: 0.5 10*3/uL (ref 0.1–0.9)
Monocytes: 6 %
Neutrophils Absolute: 5.6 10*3/uL (ref 1.4–7.0)
Neutrophils: 63 %
Platelets: 330 10*3/uL (ref 150–450)
RBC: 4.99 x10E6/uL (ref 4.14–5.80)
RDW: 12 % (ref 11.6–15.4)
WBC: 8.9 10*3/uL (ref 3.4–10.8)

## 2023-10-11 LAB — HEMOGLOBIN A1C
Est. average glucose Bld gHb Est-mCnc: 105 mg/dL
Hgb A1c MFr Bld: 5.3 % (ref 4.8–5.6)

## 2023-10-11 LAB — COMPREHENSIVE METABOLIC PANEL
ALT: 22 [IU]/L (ref 0–44)
AST: 20 [IU]/L (ref 0–40)
Albumin: 5 g/dL (ref 4.1–5.1)
Alkaline Phosphatase: 92 [IU]/L (ref 44–121)
BUN/Creatinine Ratio: 9 (ref 9–20)
BUN: 11 mg/dL (ref 6–24)
Bilirubin Total: 0.3 mg/dL (ref 0.0–1.2)
CO2: 21 mmol/L (ref 20–29)
Calcium: 9.7 mg/dL (ref 8.7–10.2)
Chloride: 103 mmol/L (ref 96–106)
Creatinine, Ser: 1.22 mg/dL (ref 0.76–1.27)
Globulin, Total: 2.3 g/dL (ref 1.5–4.5)
Glucose: 91 mg/dL (ref 70–99)
Potassium: 4.7 mmol/L (ref 3.5–5.2)
Sodium: 140 mmol/L (ref 134–144)
Total Protein: 7.3 g/dL (ref 6.0–8.5)
eGFR: 75 mL/min/{1.73_m2} (ref >59)

## 2023-10-11 LAB — FERRITIN: Ferritin: 267 ng/mL (ref 30–400)

## 2023-10-11 LAB — LIPID PANEL W/O CHOL/HDL RATIO
Cholesterol, Total: 224 mg/dL — ABNORMAL HIGH (ref 100–199)
HDL: 43 mg/dL (ref >39)
LDL Chol Calc (NIH): 135 mg/dL — ABNORMAL HIGH (ref 0–99)
Triglycerides: 255 mg/dL — ABNORMAL HIGH (ref 0–149)
VLDL Cholesterol Cal: 46 mg/dL — ABNORMAL HIGH (ref 5–40)

## 2023-10-11 LAB — VITAMIN D 25 HYDROXY (VIT D DEFICIENCY, FRACTURES): Vit D, 25-Hydroxy: 28.2 ng/mL — ABNORMAL LOW (ref 30.0–100.0)

## 2023-10-11 LAB — TSH: TSH: 1.94 u[IU]/mL (ref 0.450–4.500)

## 2023-10-11 LAB — IRON: Iron: 83 ug/dL (ref 38–169)

## 2023-10-11 NOTE — Progress Notes (Signed)
Contacted via MyChart  The 10-year ASCVD risk score (Arnett DK, et al., 2019) is: 2.5%   Values used to calculate the score:     Age: 42 years     Sex: Male     Is Non-Hispanic African American: No     Diabetic: No     Tobacco smoker: No     Systolic Blood Pressure: 123 mmHg     Is BP treated: No     HDL Cholesterol: 43 mg/dL     Total Cholesterol: 224 mg/dL  Good morning Eric Kline, your labs have returned and I know you were worried, but overall these actually look good: - Kidney function, creatinine and eGFR, remains normal, as is liver function, AST and ALT.  - Vitamin D remains low, please ensure to take Vitamin D3 2000 units daily. - Lipid panel does have elevations, no medication needed at this time.  Focus heavily on diet and regular exercise. Any questions? Keep being stellar!!  Thank you for allowing me to participate in your care.  I appreciate you. Kindest regards, Catalino Plascencia

## 2023-10-25 DIAGNOSIS — M5416 Radiculopathy, lumbar region: Secondary | ICD-10-CM | POA: Diagnosis not present

## 2023-11-01 DIAGNOSIS — M5451 Vertebrogenic low back pain: Secondary | ICD-10-CM | POA: Diagnosis not present

## 2023-11-15 DIAGNOSIS — M5451 Vertebrogenic low back pain: Secondary | ICD-10-CM | POA: Diagnosis not present

## 2023-11-22 DIAGNOSIS — M5451 Vertebrogenic low back pain: Secondary | ICD-10-CM | POA: Diagnosis not present

## 2023-12-26 DIAGNOSIS — M5451 Vertebrogenic low back pain: Secondary | ICD-10-CM | POA: Diagnosis not present

## 2024-01-02 ENCOUNTER — Other Ambulatory Visit: Payer: Self-pay | Admitting: Nurse Practitioner

## 2024-01-02 NOTE — Telephone Encounter (Signed)
 Requested Prescriptions  Pending Prescriptions Disp Refills   valACYclovir (VALTREX) 1000 MG tablet [Pharmacy Med Name: VALACYCLOVIR HCL 1 GRAM TABLET] 90 tablet 0    Sig: TAKE 1 TABLET BY MOUTH EVERY DAY     Antimicrobials:  Antiviral Agents - Anti-Herpetic Passed - 01/02/2024 12:34 PM      Passed - Valid encounter within last 12 months    Recent Outpatient Visits           2 months ago History of alcoholism (HCC)   [place] [facility] [place], Corrie Dandy T, NP   1 year ago Subacute cough   [place] [facility] [place], Corrie Dandy T, NP   1 year ago Subacute cough   [place] [facility] [place], Corrie Dandy T, NP   1 year ago Viral upper respiratory tract infection   [place] [facility] [place], Corrie Dandy T, NP   1 year ago History of alcoholism [facility])   [place] [facility] [place], Dorie Rank, NP       Future Appointments             In 9 months [place], Dorie Rank, NP [facility]

## 2024-01-28 ENCOUNTER — Other Ambulatory Visit: Payer: Self-pay

## 2024-01-28 ENCOUNTER — Ambulatory Visit
Admission: RE | Admit: 2024-01-28 | Discharge: 2024-01-28 | Disposition: A | Discharge status: Home / Self Care | Source: Ambulatory Visit | Attending: Emergency Medicine | Admitting: Emergency Medicine

## 2024-01-28 VITALS — BP 157/99 | HR 78 | Temp 98.7°F | Resp 16

## 2024-01-28 DIAGNOSIS — B349 Viral infection, unspecified: Secondary | ICD-10-CM | POA: Diagnosis not present

## 2024-01-28 MED ORDER — AMOXICILLIN-POT CLAVULANATE 875-125 MG PO TABS: 1.0000 | ORAL_TABLET | Freq: Two times a day (BID) | ORAL | 0 refills | Status: DC

## 2024-01-28 NOTE — ED Provider Notes (Signed)
 Eric Kline    CSN: [id number] Arrival date & time: 01/28/24  1335      History   Chief Complaint Chief Complaint  Patient presents with   Cough    Been sick since 4/3, thought it was seasonal allergies. Not getting better. - Entered by patient   URI    HPI Eric Kline is a 43 y.o. male.   Patient presents for evaluation of increased fatigue, nasal congestion and sinus pressure present for 5 days.  Associated ear fullness but denies pain.  Began to experience a nonproductive cough 1 day ago.  Symptoms waxing and waning but have begun to improve.  Has attempted use of vitamin infusion, DayQuil and NyQuil.  Known sick contact in household.  Decreased appetite but tolerating some food and liquids.  Denies shortness of breath or wheezing.  Denies fever.  Past Medical History:  Diagnosis Date   Allergy    Concussion    x3   Dysplastic nevus 09/07/2019   L mid back paraspinal - moderate   Dysplastic nevus 09/07/2019   L low back 4.5 cm lat to spine - moderate   Dysplastic nevus 06/16/2020   R pectoral lat to areola - moderate   Dysplastic nevus 06/16/2020   R mid lat bicep - moderate   Dysplastic nevus 12/12/2020   L mid ant thigh - moderate   Dysplastic nevus 01/10/2022   right sup medial buttock, severe Atypia- excised 02/20/22   Iron deficiency anemia    Wears contact lenses     Patient Active Problem List   Diagnosis Date Noted   Lumbar herniated disc 10/10/2023   Genital herpes 11/02/2022   Vitamin D deficiency 10/09/2022   Chronic left shoulder pain 07/18/2022   Tinnitus, left ear 09/26/2021   Elevated ALT measurement 08/17/2020   Elevated glucose 08/17/2020   History of Helicobacter pylori infection 05/14/2019   Iron deficiency anemia    History of alcoholism (HCC) 07/25/2018   S/P surgical manipulation of ankle joint 05/08/2017   HPV (human papilloma virus) infection 01/15/2017   Eczema 01/15/2017    Past Surgical History:  Procedure Laterality  Date   ANKLE ARTHROSCOPY Left 04/2017   COLONOSCOPY WITH PROPOFOL N/A 01/07/2019   Procedure: COLONOSCOPY WITH PROPOFOL;  Surgeon: Toney Reil, MD;  Location: [facility];  Service: Endoscopy;  Laterality: N/A;   ESOPHAGOGASTRODUODENOSCOPY (EGD) WITH PROPOFOL N/A 01/07/2019   Procedure: ESOPHAGOGASTRODUODENOSCOPY (EGD) WITH BIOPSIES;  Surgeon: Toney Reil, MD;  Location: [facility];  Service: Endoscopy;  Laterality: N/A;   MENISCUS REPAIR  2002   left        Home Medications    Prior to Admission medications   Medication Sig Start Date End Date Taking? Authorizing Provider  amoxicillin-clavulanate (AUGMENTIN) 875-125 MG tablet Take 1 tablet by mouth every 12 (twelve) hours. 02/02/24  Yes Miia Blanks, Elita Boone, NP  cyclobenzaprine (FLEXERIL) 10 MG tablet Take 1 tablet (10 mg total) by mouth 3 (three) times daily as needed for muscle spasms. 10/10/23   Marjie Skiff, NP  valACYclovir (VALTREX) 1000 MG tablet TAKE 1 TABLET BY MOUTH EVERY DAY 01/02/24   Marjie Skiff, NP    Family History Family History  Problem Relation Age of Onset   Cancer Mother        skin   Alzheimer's disease Maternal Grandfather     Social History Social History   Tobacco Use   Smoking status: Former   Smokeless tobacco: Former  Types: Dorna Bloom    Quit date: 2010   Tobacco comments:    smoked socially in college  Vaping Use   Vaping status: Never Used  Substance Use Topics   Alcohol use: Yes    Alcohol/week: 20.0 standard drinks of alcohol    Types: 20 Standard drinks or equivalent per week   Drug use: No    Comment: pt states he has been clean for the last 5 months- previous marijuana use      Allergies   Patient has no known allergies.   Review of Systems Review of Systems   Physical Exam Triage Vital Signs ED Triage Vitals  Encounter Vitals Group     BP 01/28/24 1400 (!) 157/99     Systolic BP Percentile --      Diastolic BP Percentile --       Pulse Rate 01/28/24 1400 78     Resp 01/28/24 1400 16     Temp 01/28/24 1400 98.7 F (37.1 C)     Temp Source 01/28/24 1400 Oral     SpO2 01/28/24 1400 98 %     Weight --      Height --      Head Circumference --      Peak Flow --      Pain Score 01/28/24 1401 0     Pain Loc --      Pain Education --      Exclude from Growth Chart --    No data found.  Updated Vital Signs BP (!) 157/99 (BP Location: Left Arm)   Pulse 78   Temp 98.7 F (37.1 C) (Oral)   Resp 16   SpO2 98%   Visual Acuity Right Eye Distance:   Left Eye Distance:   Bilateral Distance:    Right Eye Near:   Left Eye Near:    Bilateral Near:     Physical Exam Constitutional:      Appearance: Normal appearance.  HENT:     Head: Normocephalic.     Right Ear: Tympanic membrane, ear canal and external ear normal.     Left Ear: Tympanic membrane, ear canal and external ear normal.     Nose: Congestion present.     Mouth/Throat:     Mouth: Mucous membranes are moist.     Pharynx: Oropharynx is clear. Posterior oropharyngeal erythema present. No oropharyngeal exudate.  Eyes:     Extraocular Movements: Extraocular movements intact.  Cardiovascular:     Rate and Rhythm: Normal rate and regular rhythm.     Pulses: Normal pulses.     Heart sounds: Normal heart sounds.  Pulmonary:     Effort: Pulmonary effort is normal.     Breath sounds: Normal breath sounds.  Musculoskeletal:     Cervical back: Normal range of motion and neck supple.  Neurological:     Mental Status: He is alert and oriented to person, place, and time. Mental status is at baseline.      UC Treatments / Results  Labs (all labs ordered are listed, but only abnormal results are displayed) Labs Reviewed - No data to display  EKG   Radiology No results found.  Procedures Procedures (including critical care time)  Medications Ordered in UC Medications - No data to display  Initial Impression / Assessment and Plan / UC Course   I have reviewed the triage vital signs and the nursing notes.  Pertinent labs & imaging results that were available during my care of the  patient were reviewed by me and considered in my medical decision making (see chart for details).  Viral illness   Patient is in no signs of distress nor toxic appearing.  Vital signs are stable.  Low suspicion for pneumonia, pneumothorax or bronchitis and therefore will defer imaging.  COVID and flu testing declined.May use additional over-the-counter medications as needed for supportive care.  May follow-up with urgent care as needed if symptoms persist or worsen.  Note given.   Final Clinical Impressions(s) / UC Diagnoses   Final diagnoses:  Viral illness     Discharge Instructions      Symptoms today are most likely related to virus versus seasonal allergies, differentiating factors typically of fever which you not truly experience, treatment remains the same at minimizing symptoms until resolved, if no improvement seen by Sunday may pick up Augmentin from your pharmacy    You can take Tylenol and/or Ibuprofen as needed for fever reduction and pain relief.   For cough: honey 1/2 to 1 teaspoon (you can dilute the honey in water or another fluid).  You can also use guaifenesin and dextromethorphan for cough. You can use a humidifier for chest congestion and cough.  If you don't have a humidifier, you can sit in the bathroom with the hot shower running.      For sore throat: try warm salt water gargles, cepacol lozenges, throat spray, warm tea or water with lemon/honey, popsicles or ice, or OTC cold relief medicine for throat discomfort.   For congestion: take a daily anti-histamine like Zyrtec, Claritin, and a oral decongestant, such as pseudoephedrine.  You can also use Flonase 1-2 sprays in each nostril daily.   It is important to stay hydrated: drink plenty of fluids (water, gatorade/powerade/pedialyte, juices, or teas) to keep your throat  moisturized and help further relieve irritation/discomfort.    ED Prescriptions     Medication Sig Dispense Auth. Provider   amoxicillin-clavulanate (AUGMENTIN) 875-125 MG tablet Take 1 tablet by mouth every 12 (twelve) hours. 14 tablet Demitra Danley, Elita Boone, NP      PDMP not reviewed this encounter.   Valinda Hoar, NP 01/28/24 1446

## 2024-01-28 NOTE — Discharge Instructions (Signed)
 Symptoms today are most likely related to virus versus seasonal allergies, differentiating factors typically of fever which you not truly experience, treatment remains the same at minimizing symptoms until resolved, if no improvement seen by Sunday may pick up Augmentin from your pharmacy    You can take Tylenol and/or Ibuprofen as needed for fever reduction and pain relief.   For cough: honey 1/2 to 1 teaspoon (you can dilute the honey in water or another fluid).  You can also use guaifenesin and dextromethorphan for cough. You can use a humidifier for chest congestion and cough.  If you don't have a humidifier, you can sit in the bathroom with the hot shower running.      For sore throat: try warm salt water gargles, cepacol lozenges, throat spray, warm tea or water with lemon/honey, popsicles or ice, or OTC cold relief medicine for throat discomfort.   For congestion: take a daily anti-histamine like Zyrtec, Claritin, and a oral decongestant, such as pseudoephedrine.  You can also use Flonase 1-2 sprays in each nostril daily.   It is important to stay hydrated: drink plenty of fluids (water, gatorade/powerade/pedialyte, juices, or teas) to keep your throat moisturized and help further relieve irritation/discomfort.

## 2024-01-28 NOTE — ED Triage Notes (Signed)
 Patient presents to [facility] for evaluation after he started feeling run down on Wednesday when his son started to feel bad  He felt very run down on Thursday and called out of work.  On Friday he had to go to work for a half day but was in bed the rest of the day.  Saturday he felt like he had "been run over by a bus".  He has had pressure and tightness in his sinuses and chest and some dizziness with movement.  At 10am this morning he went and had an IV infusion and he says he is feeling a lot better, but wanted to get his vitals checked and an overall evaluation to make sure he is okay

## 2024-03-20 ENCOUNTER — Ambulatory Visit: Payer: Self-pay

## 2024-03-20 NOTE — Telephone Encounter (Signed)
  Chief Complaint: Ringing in ears and HA Symptoms: above Frequency: ongoing Pertinent Negatives: Patient denies pain Disposition: [] ED /[] Urgent Care (no appt availability in office) / [x] Appointment(In office/virtual)/ []  [facility]/ [] Home Care/ [] Refused Recommended Disposition /[] [facility]/ []  Follow-up with PCP Additional Notes: Pt reports that ringing in ears is long term pt was prescribed medication by ENT, but did not purchase d/t cost. PT states that now he is also experiencing pain between his eyes and thinks this may be a migraine. Pt is also light sensitive.  Pt also has long standing back and leg pain that need to be addressed. Appt scheduled for Monday morning.    Copied from CRM [id number]. Topic: Clinical - Red Word Triage >> Mar 20, 2024 11:44 AM Oddis Bench wrote: Red Word that prompted transfer to Nurse Triage: Patient is stating that he has tenitius and now he is getting migranes, he is stating that he was referred to audiologist but it is getting worst. Reason for Disposition  MODERATE-SEVERE tinnitus (i.e., interferes with work, school, or sleep)  Answer Assessment - Initial Assessment Questions 1. DESCRIPTION: "Describe the sound you are hearing." (e.g., buzzing, hissing, humming, ringing)     Muffled - ringing 2. LOCATION: "Is the sound in one or both ears?" If one, ask: "Which ear?"     both 3. SEVERITY: "How bad is it?"    - MILD: Doesn't interfere with normal activities, only can hear in a quiet room.    - MODERATE-SEVERE: interferes with work, school, sleep, or other activities.      moderate 4. ONSET: "When did this begin?" "Did it start suddenly or come on gradually?"     gradually 5. PATTERN: "Does this come and go, or has it been constant since it started?"     constant 6. HEARING LOSS: "Is your hearing decreased?" (e.g., normal, decreased)       yes 7. OTHER SYMPTOMS: "Do you have any other symptoms?" (e.g., dizziness, earache)      HA - between eyes  Protocols used: Tinnitus-A-AH

## 2024-03-21 NOTE — Patient Instructions (Incomplete)
 Migraine Headache A migraine headache is a very strong throbbing pain on one or both sides of your head. This type of headache can also cause other symptoms. It can last from 4 hours to 3 days. Talk with your doctor about what things may bring on (trigger) this condition. What are the causes? The exact cause of a migraine is not known. This condition may be brought on or caused by: Smoking. Medicines, such as: Medicine used to treat chest pain (nitroglycerin). Birth control pills. Estrogen. Some blood pressure medicines. Certain substances in some foods or drinks. Foods and drinks, such as: Cheese. Chocolate. Alcohol. Caffeine. Doing physical activity that is very hard. Other things that may trigger a migraine headache include: Periods. Pregnancy. Hunger. Stress. Getting too much or too little sleep. Weather changes. Feeling tired (fatigue). What increases the risk? Being 40-24 years old. Being male. Having a family history of migraine headaches. Being Caucasian. Having a mental health condition, such as being sad (depressed) or feeling worried or nervous (anxious). Being very overweight (obese). What are the signs or symptoms? A throbbing pain. This pain may: Happen in any area of the head, such as on one or both sides. Make it hard to do daily activities. Get worse with physical activity. Get worse around bright lights, loud noises, or smells. Other symptoms may include: Feeling like you may vomit (nauseous). Vomiting. Dizziness. Before a migraine headache starts, you may get warning signs (an aura). An aura may include: Seeing flashing lights or having blind spots. Seeing bright spots, halos, or zigzag lines. Having tunnel vision or blurred vision. Having numbness or a tingling feeling. Having trouble talking. Having weak muscles. After a migraine ends, you may have symptoms. These may include: Tiredness. Trouble thinking (concentrating). How is this  treated? Taking medicines that: Relieve pain. Relieve the feeling like you may vomit. Prevent migraine headaches. Treatment may also include: Acupuncture. Lifestyle changes like avoiding foods that bring on migraine headaches. Learning ways to control your body functions (biofeedback). Therapy to help you know and deal with negative thoughts (cognitive behavioral therapy). Follow these instructions at home: Medicines Take over-the-counter and prescription medicines only as told by your doctor. If told, take steps to prevent problems with pooping (constipation). You may need to: Drink enough fluid to keep your pee (urine) pale yellow. Take medicines. You will be told what medicines to take. Eat foods that are high in fiber. These include beans, whole grains, and fresh fruits and vegetables. Limit foods that are high in fat and sugar. These include fried or sweet foods. Ask your doctor if you should avoid driving or using machines while you are taking your medicine. Lifestyle  Do not drink alcohol. Do not smoke or use any products that contain nicotine or tobacco. If you need help quitting, ask your doctor. Get 7-9 hours of sleep each night, or the amount recommended by your doctor. Find ways to deal with stress, such as meditation, deep breathing, or yoga. Try to exercise often. This can help lessen how bad and how often your migraines happen. General instructions Keep a journal to find out what may bring on your migraine headaches. This can help you avoid those things. For example, write down: What you eat and drink. How much sleep you get. Any change to your medicines or diet. If you have a migraine headache: Avoid things that make your symptoms worse, such as bright lights. Lie down in a dark, quiet room. Do not drive or use machinery. Ask your  doctor what activities are safe for you. Where to find more information Coalition for Headache and Migraine Patients (CHAMP):  headachemigraine.org American Migraine Foundation: americanmigrainefoundation.org National Headache Foundation: headaches.org Contact a doctor if: You get a migraine headache that is different or worse than others you have had. You have more than 15 days of headaches in one month. Get help right away if: Your migraine headache gets very bad. Your migraine headache lasts more than 72 hours. You have a fever or stiff neck. You have trouble seeing. Your muscles feel weak or like you cannot control them. You lose your balance a lot. You have trouble walking. You faint. You have a seizure. This information is not intended to replace advice given to you by your health care provider. Make sure you discuss any questions you have with your health care provider. Document Revised: 06/04/2022 Document Reviewed: 06/04/2022 Elsevier Patient Education  2024 [employer].

## 2024-03-23 ENCOUNTER — Encounter: Payer: Self-pay | Admitting: Nurse Practitioner

## 2024-03-23 ENCOUNTER — Ambulatory Visit: Admitting: Nurse Practitioner

## 2024-03-23 VITALS — BP 111/69 | HR 62 | Temp 97.7°F | Ht 70.0 in | Wt 216.4 lb

## 2024-03-23 DIAGNOSIS — R519 Headache, unspecified: Secondary | ICD-10-CM | POA: Insufficient documentation

## 2024-03-23 DIAGNOSIS — H9313 Tinnitus, bilateral: Secondary | ICD-10-CM

## 2024-03-23 MED ORDER — AMOXICILLIN-POT CLAVULANATE 875-125 MG PO TABS: 1.0000 | ORAL_TABLET | Freq: Two times a day (BID) | ORAL | 0 refills | Status: AC

## 2024-03-23 MED ORDER — PREDNISONE 20 MG PO TABS: 40.0000 mg | ORAL_TABLET | Freq: Every day | ORAL | 0 refills | Status: AC

## 2024-03-23 NOTE — Progress Notes (Signed)
 BP 111/69   Pulse 62   Temp 97.7 F (36.5 C) (Oral)   Ht 5\' 10"  (1.778 m)   Wt 216 lb 6.4 oz (98.2 kg)   SpO2 97%   BMI 31.05 kg/m    Subjective:    Patient ID: Eric Kline, male    DOB: 11/10/1980, 43 y.o.   MRN: [id number]  HPI: Eric Kline is a 43 y.o. male  Chief Complaint  Patient presents with   Migraine    Patient states he has been experiencing migraines for the last 5 months. States he has been experiencing about to 2 to 3 migraines per week. States he has been Advil, but states this does not help. States he has not taken any prescription meds for migraines in the past.    Tinnitus    Patient states he has been having issues with this for years. States it is becoming more frequent. States it seems to be worse when there is more noise involved, like a room full of people.    HEADACHES Present for 5 months, around end of January. 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 Losing job is a constant worry.  No recent heavy alcohol use.  In past has not had similar headaches. Start mid forehead and then radiates to both temples.  Days he is more congested the headaches are worse. Duration: months Onset: sudden Severity: 4/10 Quality: dull, aching, and throbbing Frequency: intermittent Location: as above  Headache duration: most of the day Radiation: no Time of day headache occurs: starts in morning Alleviating factors: getting into sauna, if times nasal spray right Aggravating factors: humid days if congested Headache status at time of visit: current headache Treatments attempted: Nasal sprays, nettie pot, sauna,  Advil, increasing fluids Aura: no Nausea:  no Vomiting: no Photophobia:  yes Phonophobia:  no Effect on social functioning: occasionally Numbers of missed days of school/work each month: a couple days Confusion:  no Gait disturbance/ataxia:  no Behavioral changes:  no Fevers:  no   TINNITUS  (BILATERAL) Ongoing for years, but feels this is worsening. Has seen ENT in past, last at end of 2022 for tinnitus. 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 Exposure to ototoxic medications: no Vertigo:no Hearing loss: yes -- harder hear Aural fullness: no Headache:yes  TMJ syndrome symptoms: no Unsteady gait: no Postural instability: no Diplopia, dysarthria, dysphagia or weakness: no Anxietydepression: yes    03/23/2024    9:40 AM 10/10/2023    8:13 AM 12/21/2022    9:04 AM 12/14/2022    8:17 AM 11/23/2022   10:21 AM  Depression screen PHQ 2/9  Decreased Interest 1 3 1 1  0  Down, Depressed, Hopeless 1 3 0 0 0  PHQ - 2 Score 2 6 1 1  0  Altered sleeping 3 3 1 1  0  Tired, decreased energy 2 3 1 2  0  Change in appetite 1 3 1 1  0  Feeling bad or failure about yourself  0 1 0 0 0  Trouble concentrating 1 3 1 1  0  Moving slowly or fidgety/restless 2 3 1  0 0  Suicidal thoughts 0 0 0 0 0  PHQ-9 Score 11 22 6 6  0  Difficult doing work/chores Somewhat difficult Extremely dIfficult Somewhat difficult Not difficult at all  03/23/2024    9:41 AM 10/10/2023    8:13 AM 12/21/2022    9:04 AM 12/14/2022    8:18 AM  GAD 7 : Generalized Anxiety Score  Nervous, Anxious, on Edge 1 3 1 1   Control/stop worrying 0 2 0 0  Worry too much - different things 1 2 0 0  Trouble relaxing 2 3 1  0  Restless 1 3 1  0  Easily annoyed or irritable 1 3 1 1   Afraid - awful might happen 0 3 0 0  Total GAD 7 Score 6 19 4 2   Anxiety Difficulty Somewhat difficult Extremely difficult Somewhat difficult Not difficult at all    Relevant past medical, surgical, family and social history reviewed and updated as indicated. Interim medical history since our last visit  reviewed. Allergies and medications reviewed and updated.  Review of Systems  Constitutional:  Negative for activity change, appetite change, diaphoresis, fatigue and fever.  HENT:  Positive for congestion, postnasal drip, rhinorrhea, sinus pressure and tinnitus. Negative for ear discharge, ear pain and sore throat.   Respiratory:  Negative for cough, chest tightness, shortness of breath and wheezing.   Cardiovascular:  Negative for chest pain, palpitations and leg swelling.  Gastrointestinal: Negative.   Neurological:  Positive for headaches. Negative for dizziness, tremors, weakness and light-headedness.  Psychiatric/Behavioral: Negative.      Per HPI unless specifically indicated above     Objective:     BP 111/69   Pulse 62   Temp 97.7 F (36.5 C) (Oral)   Ht 5\' 10"  (1.778 m)   Wt 216 lb 6.4 oz (98.2 kg)   SpO2 97%   BMI 31.05 kg/m   Wt Readings from Last 3 Encounters:  03/23/24 216 lb 6.4 oz (98.2 kg)  10/10/23 215 lb (97.5 kg)  12/21/22 208 lb 3.2 oz (94.4 kg)    Physical Exam Vitals and nursing note reviewed.  Constitutional:      General: He is awake. He is not in acute distress.    Appearance: He is well-developed and well-groomed. He is obese. He is not ill-appearing or toxic-appearing.  HENT:     Head: Normocephalic.     Right Ear: Hearing, ear canal and external ear normal. A middle ear effusion is present. There is no impacted cerumen. Tympanic membrane is not injected.     Left Ear: Hearing, ear canal and external ear normal. A middle ear effusion is present. There is no impacted cerumen. Tympanic membrane is not injected.     Nose:     Right Turbinates: Swollen.     Left Turbinates: Swollen.     Right Sinus: Maxillary sinus tenderness and frontal sinus tenderness present.     Left Sinus: Maxillary sinus tenderness and frontal sinus tenderness present.     Mouth/Throat:     Mouth: Mucous membranes are moist.     Pharynx: Posterior oropharyngeal erythema  and postnasal drip present. No pharyngeal swelling or oropharyngeal exudate.  Eyes:     General: Lids are normal.     Extraocular Movements: Extraocular movements intact.     Conjunctiva/sclera: Conjunctivae normal.  Neck:     Thyroid : No thyromegaly.     Vascular: No carotid bruit.  Cardiovascular:     Rate and Rhythm: Normal rate and regular rhythm.     Heart sounds: Normal heart sounds. No murmur heard.    No gallop.  Pulmonary:     Effort: No accessory muscle usage or respiratory distress.  Breath sounds: Normal breath sounds.  Abdominal:     General: Bowel sounds are normal. There is no distension.     Palpations: Abdomen is soft.     Tenderness: There is no abdominal tenderness.  Musculoskeletal:     Cervical back: Full passive range of motion without pain.     Right lower leg: No edema.     Left lower leg: No edema.  Lymphadenopathy:     Cervical: No cervical adenopathy.  Skin:    General: Skin is warm.     Capillary Refill: Capillary refill takes less than 2 seconds.  Neurological:     Mental Status: He is alert and oriented to person, place, and time.     Cranial Nerves: Cranial nerves 2-12 are intact.     Coordination: Coordination is intact.     Gait: Gait is intact.     Deep Tendon Reflexes: Reflexes are normal and symmetric.     Reflex Scores:      Brachioradialis reflexes are 2+ on the right side and 2+ on the left side.      Patellar reflexes are 2+ on the right side and 2+ on the left side. Psychiatric:        Attention and Perception: Attention normal.        Mood and Affect: Mood normal.        Speech: Speech normal.        Behavior: Behavior normal. Behavior is cooperative.        Thought Content: Thought content normal.     Results for orders placed or performed in visit on 10/10/23  CBC with Differential/Platelet   Collection Time: 10/10/23  8:30 AM  Result Value Ref Range   WBC 8.9 3.4 - 10.8 x10E3/uL   RBC 4.99 4.14 - 5.80 x10E6/uL    Hemoglobin 15.7 13.0 - 17.7 g/dL   Hematocrit 40.9 81.1 - 51.0 %   MCV 95 79 - 97 fL   MCH 31.5 26.6 - 33.0 pg   MCHC 33.1 31.5 - 35.7 g/dL   RDW 91.4 78.2 - 95.6 %   Platelets 330 150 - 450 x10E3/uL   Neutrophils 63 Not Estab. %   Lymphs 28 Not Estab. %   Monocytes 6 Not Estab. %   Eos 3 Not Estab. %   Basos 0 Not Estab. %   Neutrophils Absolute 5.6 1.4 - 7.0 x10E3/uL   Lymphocytes Absolute 2.5 0.7 - 3.1 x10E3/uL   Monocytes Absolute 0.5 0.1 - 0.9 x10E3/uL   EOS (ABSOLUTE) 0.2 0.0 - 0.4 x10E3/uL   Basophils Absolute 0.0 0.0 - 0.2 x10E3/uL   Immature Granulocytes 0 Not Estab. %   Immature Grans (Abs) 0.0 0.0 - 0.1 x10E3/uL  Comprehensive metabolic panel   Collection Time: 10/10/23  8:30 AM  Result Value Ref Range   Glucose 91 70 - 99 mg/dL   BUN 11 6 - 24 mg/dL   Creatinine, Ser 2.13 0.76 - 1.27 mg/dL   eGFR 75 >08 MV/HQI/6.96   BUN/Creatinine Ratio 9 9 - 20   Sodium 140 134 - 144 mmol/L   Potassium 4.7 3.5 - 5.2 mmol/L   Chloride 103 96 - 106 mmol/L   CO2 21 20 - 29 mmol/L   Calcium 9.7 8.7 - 10.2 mg/dL   Total Protein 7.3 6.0 - 8.5 g/dL   Albumin 5.0 4.1 - 5.1 g/dL   Globulin, Total 2.3 1.5 - 4.5 g/dL   Bilirubin Total 0.3 0.0 - 1.2 mg/dL   Alkaline  Phosphatase 92 44 - 121 IU/L   AST 20 0 - 40 IU/L   ALT 22 0 - 44 IU/L  TSH   Collection Time: 10/10/23  8:30 AM  Result Value Ref Range   TSH 1.940 0.450 - 4.500 uIU/mL  Lipid Panel w/o Chol/HDL Ratio   Collection Time: 10/10/23  8:30 AM  Result Value Ref Range   Cholesterol, Total 224 (H) 100 - 199 mg/dL   Triglycerides 161 (H) 0 - 149 mg/dL   HDL 43 >09 mg/dL   VLDL Cholesterol Cal 46 (H) 5 - 40 mg/dL   LDL Chol Calc (NIH) 604 (H) 0 - 99 mg/dL  VITAMIN D  25 Hydroxy (Vit-D Deficiency, Fractures)   Collection Time: 10/10/23  8:30 AM  Result Value Ref Range   Vit D, 25-Hydroxy 28.2 (L) 30.0 - 100.0 ng/mL  Ferritin   Collection Time: 10/10/23  8:30 AM  Result Value Ref Range   Ferritin 267 30 - 400 ng/mL  Iron    Collection Time: 10/10/23  8:30 AM  Result Value Ref Range   Iron 83 38 - 169 ug/dL  HgB V4U   Collection Time: 10/10/23  8:30 AM  Result Value Ref Range   Hgb A1c MFr Bld 5.3 4.8 - 5.6 %   Est. average glucose Bld gHb Est-mCnc 105 mg/dL      Assessment & Plan:   Problem List Items Addressed This Visit       Other   Tinnitus, bilateral   Present for several years, saw ENT in 2022 for this.  Currently with worsening, ?if related to increased stress and sinus issues. Will start Augmentin  BID for 7 days and Prednisone  40 MG daily for 5 days.  Recommend he start taking Xyzal or Zyrtec daily + using Azelastine nasal spray.  Plan to follow-up in 2 weeks and if ongoing will get back into ENT.      Frequent headaches - Primary   For 5 months.  ?if related to increased stress and sinus issues, significant tenderness to both frontal and maxillary sinuses. Will start Augmentin  BID for 7 days and Prednisone  40 MG daily for 5 days.  Recommend he start taking Xyzal or Zyrtec daily + using Azelastine nasal spray. May use Advil as needed for headache pain.  Plan to follow-up in 2 weeks and if ongoing will get back into ENT.        Follow up plan: Return in about 2 weeks (around 04/06/2024) for HEADACHES AND TINNITUS.

## 2024-03-23 NOTE — Assessment & Plan Note (Signed)
 Present for several years, saw ENT in 2022 for this.  Currently with worsening, ?if related to increased stress and sinus issues. Will start Augmentin  BID for 7 days and Prednisone  40 MG daily for 5 days.  Recommend he start taking Xyzal or Zyrtec daily + using Azelastine nasal spray.  Plan to follow-up in 2 weeks and if ongoing will get back into ENT.

## 2024-03-23 NOTE — Assessment & Plan Note (Signed)
 For 5 months.  ?if related to increased stress and sinus issues, significant tenderness to both frontal and maxillary sinuses. Will start Augmentin  BID for 7 days and Prednisone  40 MG daily for 5 days.  Recommend he start taking Xyzal or Zyrtec daily + using Azelastine nasal spray. May use Advil as needed for headache pain.  Plan to follow-up in 2 weeks and if ongoing will get back into ENT.

## 2024-03-31 DIAGNOSIS — M25572 Pain in left ankle and joints of left foot: Secondary | ICD-10-CM | POA: Diagnosis not present

## 2024-04-04 NOTE — Patient Instructions (Signed)
Be Involved in Caring For Your Health:  Taking Medications When medications are taken as directed, they can greatly improve your health. But if they are not taken as prescribed, they may not work. In some cases, not taking them correctly can be harmful. To help ensure your treatment remains effective and safe, understand your medications and how to take them. Bring your medications to each visit for review by your provider.  Your lab results, notes, and after visit summary will be available on My Chart. We strongly encourage you to use this feature. If lab results are abnormal the clinic will contact you with the appropriate steps. If the clinic does not contact you assume the results are satisfactory. You can always view your results on My Chart. If you have questions regarding your health or results, please contact the clinic during office hours. You can also ask questions on My Chart.  We at [facility] are grateful that you chose [place] to provide your care. We strive to provide evidence-based and compassionate care and are always looking for feedback. If you get a survey from the clinic please complete this so we can hear your opinions.  Tinnitus Tinnitus is when you hear a sound that there's no actual source for. It may sound like ringing in your ears or something else. The sound may be loud, soft, or somewhere in between. It can last for a few seconds or be constant for days. It can come and go. Almost everyone has tinnitus at some point. It's not the same as hearing loss. But you may need to see a health care provider if: It lasts for a long time. It comes back often. You have trouble sleeping and focusing. What are the causes? The cause of tinnitus is often unknown. In some cases, you may get it if: You're around loud noises, such as from machines or music. An object gets stuck in your ear. Earwax builds up in Landscape architect. You drink a lot of alcohol or caffeine. You take certain  medicines. You start to lose your hearing. You may also get it from some medical conditions. These may include: Ear or sinus infections. Heart diseases. High blood pressure. Allergies. Mnire's disease. Problems with your thyroid. A tumor. This is a growth of cells that isn't normal. A weak, bulging blood vessel called an aneurysm near your ear. What increases the risk? You may be more likely to get tinnitus if: You're around loud noises a lot. You're older. You drink alcohol. You smoke. What are the signs or symptoms? The main symptom is hearing a sound that there's no source for. It may sound like ringing. It may also sound like: Buzzing. Sizzling. Blowing air. Hissing. Whistling. Other sounds may include: Roaring. Running water. A musical note. Tapping. Humming. You may have symptoms in one ear or both ears. How is this diagnosed? Tinnitus is diagnosed based on your symptoms, your medical history, and an exam. Your provider may do a full hearing test if your tinnitus: Is in just one ear. Makes it hard for you to hear. Lasts 6 months or longer. You may also need to see an expert in hearing disorders called an audiologist. They may ask you about your symptoms and how tinnitus affects your daily life. You may have other tests done. These may include: A CT scan. An MRI. An angiogram. This shows how blood flows through your blood vessels. How is this treated? Treatment may include: Therapy to help you manage the stress  of living with tinnitus. Finding ways to mask or cover the sound of tinnitus. These include: Sound or white noise machines. Devices that fit in your ear and play sounds or music. A loud humidifier. Acoustic neural stimulation. This is when you use headphones to listen to music that has a special signal in it. Over time, this signal may change some of the pathways in your brain. This can make you less sensitive to tinnitus. This treatment is used for very  severe cases. Using hearing aids or cochlear implants if your tinnitus is from hearing loss. If your tinnitus is caused by a medical condition, treating the condition may make it go away.  Follow these instructions at home: Managing symptoms     Try to avoid being in loud places or around loud noises. Wear earplugs or headphones when you're around loud noises. Find ways to reduce stress. These may include meditation, yoga, or deep breathing. Sleep with your head slightly raised. General instructions Take over-the-counter and prescription medicines only as told by your provider. Track the things that cause symptoms (triggers). Try to avoid these things. To stop your tinnitus from getting worse: Do not drink alcohol. Do not have caffeine. Do not use any products that contain nicotine or tobacco. These products include cigarettes, chewing tobacco, and vaping devices, such as e-cigarettes. If you need help quitting, ask your provider. Avoid using too much salt. Get enough sleep each night. Where to find more information American Tinnitus Association: https://www.johnson-[place].org/ Contact a health care provider if: Your symptoms last for 3 weeks or longer without stopping. You have sudden hearing loss. Your symptoms get worse or don't get better with home care. You can't manage the stress of living with tinnitus. Get help right away if: You get tinnitus after a head injury. You have tinnitus and: Dizziness. Nausea and vomiting. Loss of balance. A sudden, severe headache. Changes to your eyesight. Weakness in your face, arms, or legs. These symptoms may be an emergency. Get help right away. Call 911. Do not wait to see if the symptoms will go away. Do not drive yourself to the hospital. This information is not intended to replace advice given to you by your health care provider. Make sure you discuss any questions you have with your health care provider. Document Revised: 01/14/2023 Document Reviewed:  01/14/2023 Elsevier Patient Education  2024 [employer].

## 2024-04-06 ENCOUNTER — Telehealth (INDEPENDENT_AMBULATORY_CARE_PROVIDER_SITE_OTHER): Admitting: Nurse Practitioner

## 2024-04-06 ENCOUNTER — Encounter: Payer: Self-pay | Admitting: Nurse Practitioner

## 2024-04-06 DIAGNOSIS — R519 Headache, unspecified: Secondary | ICD-10-CM

## 2024-04-06 DIAGNOSIS — H9313 Tinnitus, bilateral: Secondary | ICD-10-CM | POA: Diagnosis not present

## 2024-04-06 NOTE — Assessment & Plan Note (Signed)
 Improved with recent sinusitis treatment and initiation of allergy medications.  Continue OTC allergy medications and adjust as needed.

## 2024-04-06 NOTE — Progress Notes (Signed)
 There were no vitals taken for this visit.   Subjective:    Patient ID: Eric Kline, male    DOB: June 01, 1981, 43 y.o.   MRN: [id number]  HPI: Eric Kline is a 43 y.o. male  Chief Complaint  Patient presents with   Migraine    Nasal spray, claritin and antibiotics have helped.    Tinnitus    Remains with little to no improvement.    Virtual Visit via Video Note  I connected with Eric Kline on 04/06/24 at  1:20 PM EDT by a video enabled telemedicine application and verified that I am speaking with the correct person using two identifiers.  Location: Patient: work Restaurant manager, fast food: work   I discussed the limitations of evaluation and management by telemedicine and the availability of in person appointments. The patient expressed understanding and agreed to proceed.  I discussed the assessment and treatment plan with the patient. The patient was provided an opportunity to ask questions and all were answered. The patient agreed with the plan and demonstrated an understanding of the instructions.   The patient was advised to call back or seek an in-person evaluation if the symptoms worsen or if the condition fails to improve as anticipated.  I provided 25 minutes of non-face-to-face time during this encounter.   Jacqlyn Marolf T Jemmie Ledgerwood, NP   TINNITUS Follow-up today for tinnitus and headaches.  Was treated on 03/23/24 with Augmentin  and Prednisone  for sinus issues causing headaches.  He has also started oral antihistamine and nasal spray for allergies. This has offered benefit to headaches, but tinnitus is ongoing. Has been ongoing for years, but feels this is worsening. Saw ENT in past, last at end of 2022 for tinnitus.  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 Exposure to ototoxic medications: no Vertigo:no Hearing loss: yes -- had testing with ENT Aural fullness: no Headache: no TMJ syndrome symptoms: no Unsteady gait: no Postural instability: no Diplopia, dysarthria, dysphagia or weakness: no Anxietydepression: yes    04/06/2024    1:11 PM 03/23/2024    9:40 AM 10/10/2023    8:13 AM 12/21/2022    9:04 AM 12/14/2022    8:17 AM  Depression screen PHQ 2/9  Decreased Interest 0 1 3 1 1   Down, Depressed, Hopeless 0 1 3 0 0  PHQ - 2 Score 0 2 6 1 1   Altered sleeping  3 3 1 1   Tired, decreased energy  2 3 1 2   Change in appetite  1 3 1 1   Feeling bad or failure about yourself   0 1 0 0  Trouble concentrating  1 3 1 1   Moving slowly or fidgety/restless  2 3 1  0  Suicidal thoughts  0 0 0 0  PHQ-9 Score  11 22 6 6   Difficult doing work/chores  Somewhat difficult Extremely dIfficult Somewhat difficult Not difficult at all       04/06/2024    1:11 PM 03/23/2024    9:41 AM 10/10/2023    8:13 AM 12/21/2022    9:04 AM  GAD 7 : Generalized Anxiety Score  Nervous, Anxious, on Edge 1 1 3 1   Control/stop worrying 0 0 2 0  Worry too much - different things 0 1 2 0  Trouble relaxing 1 2 3 1   Restless 1 1 3 1   Easily annoyed  or irritable 1 1 3 1   Afraid - awful might happen 0 0 3 0  Total GAD 7 Score 4 6 19 4   Anxiety Difficulty  Somewhat difficult Extremely difficult Somewhat difficult   Relevant past medical, surgical, family and social history reviewed and updated as indicated. Interim medical history since our last visit reviewed. Allergies and medications reviewed and updated.  Review of Systems  Constitutional:  Negative for activity change, appetite change, diaphoresis, fatigue and fever.  HENT:  Positive for tinnitus.   Respiratory:  Negative for cough, chest tightness, shortness of breath and wheezing.   Cardiovascular:  Negative for chest pain, palpitations and leg swelling.  Gastrointestinal: Negative.    Neurological:  Negative for dizziness, tremors, weakness, light-headedness and headaches.  Psychiatric/Behavioral: Negative.      Per HPI unless specifically indicated above     Objective:    There were no vitals taken for this visit.  Wt Readings from Last 3 Encounters:  03/23/24 216 lb 6.4 oz (98.2 kg)  10/10/23 215 lb (97.5 kg)  12/21/22 208 lb 3.2 oz (94.4 kg)    Physical Exam Vitals and nursing note reviewed.  Constitutional:      General: He is awake. He is not in acute distress.    Appearance: He is well-developed. He is not ill-appearing.  HENT:     Head: Normocephalic.     Right Ear: Hearing normal. No drainage.     Left Ear: Hearing normal. No drainage.   Eyes:     General: Lids are normal.        Right eye: No discharge.        Left eye: No discharge.     Conjunctiva/sclera: Conjunctivae normal.   Pulmonary:     Effort: Pulmonary effort is normal. No accessory muscle usage or respiratory distress.   Musculoskeletal:     Cervical back: Normal range of motion.   Neurological:     Mental Status: He is alert and oriented to person, place, and time.   Psychiatric:        Mood and Affect: Mood normal.        Behavior: Behavior normal. Behavior is cooperative.        Thought Content: Thought content normal.        Judgment: Judgment normal.    Results for orders placed or performed in visit on 10/10/23  CBC with Differential/Platelet   Collection Time: 10/10/23  8:30 AM  Result Value Ref Range   WBC 8.9 3.4 - 10.8 x10E3/uL   RBC 4.99 4.14 - 5.80 x10E6/uL   Hemoglobin 15.7 13.0 - 17.7 g/dL   Hematocrit 16.1 09.6 - 51.0 %   MCV 95 79 - 97 fL   MCH 31.5 26.6 - 33.0 pg   MCHC 33.1 31.5 - 35.7 g/dL   RDW 04.5 40.9 - 81.1 %   Platelets 330 150 - 450 x10E3/uL   Neutrophils 63 Not Estab. %   Lymphs 28 Not Estab. %   Monocytes 6 Not Estab. %   Eos 3 Not Estab. %   Basos 0 Not Estab. %   Neutrophils Absolute 5.6 1.4 - 7.0 x10E3/uL   Lymphocytes Absolute  2.5 0.7 - 3.1 x10E3/uL   Monocytes Absolute 0.5 0.1 - 0.9 x10E3/uL   EOS (ABSOLUTE) 0.2 0.0 - 0.4 x10E3/uL   Basophils Absolute 0.0 0.0 - 0.2 x10E3/uL   Immature Granulocytes 0 Not Estab. %   Immature Grans (Abs) 0.0 0.0 - 0.1 x10E3/uL  Comprehensive metabolic panel  Collection Time: 10/10/23  8:30 AM  Result Value Ref Range   Glucose 91 70 - 99 mg/dL   BUN 11 6 - 24 mg/dL   Creatinine, Ser 8.29 0.76 - 1.27 mg/dL   eGFR 75 >56 OZ/HYQ/6.57   BUN/Creatinine Ratio 9 9 - 20   Sodium 140 134 - 144 mmol/L   Potassium 4.7 3.5 - 5.2 mmol/L   Chloride 103 96 - 106 mmol/L   CO2 21 20 - 29 mmol/L   Calcium 9.7 8.7 - 10.2 mg/dL   Total Protein 7.3 6.0 - 8.5 g/dL   Albumin 5.0 4.1 - 5.1 g/dL   Globulin, Total 2.3 1.5 - 4.5 g/dL   Bilirubin Total 0.3 0.0 - 1.2 mg/dL   Alkaline Phosphatase 92 44 - 121 IU/L   AST 20 0 - 40 IU/L   ALT 22 0 - 44 IU/L  TSH   Collection Time: 10/10/23  8:30 AM  Result Value Ref Range   TSH 1.940 0.450 - 4.500 uIU/mL  Lipid Panel w/o Chol/HDL Ratio   Collection Time: 10/10/23  8:30 AM  Result Value Ref Range   Cholesterol, Total 224 (H) 100 - 199 mg/dL   Triglycerides 846 (H) 0 - 149 mg/dL   HDL 43 >96 mg/dL   VLDL Cholesterol Cal 46 (H) 5 - 40 mg/dL   LDL Chol Calc (NIH) 295 (H) 0 - 99 mg/dL  VITAMIN D  25 Hydroxy (Vit-D Deficiency, Fractures)   Collection Time: 10/10/23  8:30 AM  Result Value Ref Range   Vit D, 25-Hydroxy 28.2 (L) 30.0 - 100.0 ng/mL  Ferritin   Collection Time: 10/10/23  8:30 AM  Result Value Ref Range   Ferritin 267 30 - 400 ng/mL  Iron   Collection Time: 10/10/23  8:30 AM  Result Value Ref Range   Iron 83 38 - 169 ug/dL  HgB M8U   Collection Time: 10/10/23  8:30 AM  Result Value Ref Range   Hgb A1c MFr Bld 5.3 4.8 - 5.6 %   Est. average glucose Bld gHb Est-mCnc 105 mg/dL      Assessment & Plan:   Problem List Items Addressed This Visit       Other   Tinnitus, bilateral   Present for several years, saw ENT in 2022 for  this.  Continues to be present, no improvement. Offered referral to return to ENT, he declines at this time.  Will consider in future.      Frequent headaches - Primary   Improved with recent sinusitis treatment and initiation of allergy medications.  Continue OTC allergy medications and adjust as needed.        Follow up plan: Return for as scheduled December 22nd for physical.

## 2024-04-06 NOTE — Assessment & Plan Note (Signed)
 Present for several years, saw ENT in 2022 for this.  Continues to be present, no improvement. Offered referral to return to ENT, he declines at this time.  Will consider in future.

## 2024-04-07 ENCOUNTER — Other Ambulatory Visit: Payer: Self-pay | Admitting: Nurse Practitioner

## 2024-04-09 NOTE — Telephone Encounter (Signed)
 LOV 03/23/2024 and 04/06/2024.  Requested Prescriptions  Pending Prescriptions Disp Refills   valACYclovir  (VALTREX ) 1000 MG tablet [Pharmacy Med Name: VALACYCLOVIR  HCL 1 GRAM TABLET] 90 tablet 0    Sig: TAKE 1 TABLET BY MOUTH EVERY DAY     Antimicrobials:  Antiviral Agents - Anti-Herpetic Failed - 04/09/2024 10:40 AM      Failed - Valid encounter within last 12 months    Recent Outpatient Visits           3 days ago Frequent headaches   [place] [facility] [place], Sanjuan Crumbly T, NP   2 weeks ago Frequent headaches   [place] [facility] [place], Lavelle Posey, NP       Future Appointments             In 6 months Cannady, Jolene T, NP [place] [facility]

## 2024-05-31 DIAGNOSIS — M5416 Radiculopathy, lumbar region: Secondary | ICD-10-CM | POA: Insufficient documentation

## 2024-06-01 DIAGNOSIS — M1712 Unilateral primary osteoarthritis, left knee: Secondary | ICD-10-CM | POA: Diagnosis not present

## 2024-06-08 DIAGNOSIS — M1712 Unilateral primary osteoarthritis, left knee: Secondary | ICD-10-CM | POA: Diagnosis not present

## 2024-06-15 DIAGNOSIS — M1712 Unilateral primary osteoarthritis, left knee: Secondary | ICD-10-CM | POA: Diagnosis not present

## 2024-06-23 ENCOUNTER — Encounter: Payer: Self-pay | Admitting: Nurse Practitioner

## 2024-06-23 DIAGNOSIS — M722 Plantar fascial fibromatosis: Secondary | ICD-10-CM

## 2024-06-23 MED ORDER — PODOFILOX 0.5 % EX GEL: CUTANEOUS | 5 refills | Status: DC

## 2024-07-07 ENCOUNTER — Ambulatory Visit

## 2024-07-07 DIAGNOSIS — M25372 Other instability, left ankle: Secondary | ICD-10-CM

## 2024-07-07 DIAGNOSIS — M79672 Pain in left foot: Secondary | ICD-10-CM

## 2024-07-07 DIAGNOSIS — M76822 Posterior tibial tendinitis, left leg: Secondary | ICD-10-CM | POA: Diagnosis not present

## 2024-07-07 DIAGNOSIS — M722 Plantar fascial fibromatosis: Secondary | ICD-10-CM

## 2024-07-07 DIAGNOSIS — M2042 Other hammer toe(s) (acquired), left foot: Secondary | ICD-10-CM

## 2024-07-07 MED ORDER — METHYLPREDNISOLONE 4 MG PO TBPK: ORAL_TABLET | ORAL | 0 refills | Status: DC

## 2024-07-07 NOTE — Progress Notes (Signed)
 Subjective:  Patient ID: Eric Kline, male    DOB: 26-Dec-1980,  MRN: [id number]  No chief complaint on file.   43 y.o. male presents with the above complaint.  Patient states that approximately 20 years ago he was working in the [employer] and experienced an injury to his left foot and ankle.  He stated this gave him lateral ankle ligament instability and knee pain.  He subsequently had a lateral ankle ligament stabilization surgery performed.  He states that this did improve his stability but he does feel relatively unstable still.  He has subsequently tried physical therapy. Patient does wear supportive shoegear. Denies any other complaints.   Review of Systems: Negative except as noted in the HPI. Denies N/V/F/Ch.  Past Medical History:  Diagnosis Date   Allergy    Concussion    x3   Dysplastic nevus 09/07/2019   L mid back paraspinal - moderate   Dysplastic nevus 09/07/2019   L low back 4.5 cm lat to spine - moderate   Dysplastic nevus 06/16/2020   R pectoral lat to areola - moderate   Dysplastic nevus 06/16/2020   R mid lat bicep - moderate   Dysplastic nevus 12/12/2020   L mid ant thigh - moderate   Dysplastic nevus 01/10/2022   right sup medial buttock, severe Atypia- excised 02/20/22   Iron deficiency anemia    Wears contact lenses     Current Outpatient Medications:    cyclobenzaprine  (FLEXERIL ) 10 MG tablet, Take 1 tablet (10 mg total) by mouth 3 (three) times daily as needed for muscle spasms., Disp: 30 tablet, Rfl: 0   methylPREDNISolone  (MEDROL  DOSEPAK) 4 MG TBPK tablet, Dispense medrol  dosepak x 6 days with tapered dosing, Disp: 1 each, Rfl: 0   podofilox  (CONDYLOX ) 0.5 % gel, Apply topically every 12 hours in the morning and evening for 3 days, then withhold for 4 days; repeat cycle up to 4 times, Disp: 3.5 g, Rfl: 5   valACYclovir  (VALTREX ) 1000 MG tablet, TAKE 1 TABLET BY MOUTH EVERY DAY, Disp: 90 tablet, Rfl: 0  Social History   Tobacco Use  Smoking Status  Former  Smokeless Tobacco Former   Types: Chew   Quit date: 2010  Tobacco Comments   smoked socially in college    No Known Allergies Objective:  There were no vitals filed for this visit. There is no height or weight on file to calculate BMI. Constitutional Well developed. Well nourished. Oriented to person, place, and time.  Vascular Dorsalis pedis pulses palpable bilaterally. Posterior tibial pulses palpable bilaterally. Capillary refill normal to all digits.  No cyanosis or clubbing noted. Pedal hair growth normal.  Neurologic Normal speech. Epicritic sensation to light touch grossly present bilaterally. Negative tinel sign at tarsal tunnel bilaterally.   Dermatologic Skin texture and turgor are within normal limits.  No open wounds. No skin lesions. Healed incision over lateral malleolus.  Musculoskeletal: 5 out of 5 muscle strength all major pedal muscle groups.  Pain with resisted inversion.  Pain with palpation of the medial calcaneal tubercle with activation of windlass mechanism.  Rectus foot shape with adequate and pain-free range of motion through the foot.  Unable to get to 90 degrees ankle dorsiflexion with knee extended, able to get past 90 with knee flexed.  Negative anterior drawer, does elicit pain. Minimal pain to palpation anterior gutter of ankle.  2nd digit exhibits reducible hammertoe deformity with lateral deviation and dorsiflexion of the metatarsophalangeal joint.   Radiographs: 3 views of  the left foot and 2 views of the left ankle were taken today Taken and reviewed.  There are no fractures or other acute osseous pathology that is identified.  Mild elevation of the first ray consistent with posterior tibial tendinitis.  Joint spaces well-preserved without arthritis at the ankle.  There is lateral deviation of the second digit with minor hammering at the distal interphalangeal joint.  Assessment:   1. Foot pain, left   2. Posterior tibial tendonitis,  left   3. Plantar fasciitis, left   4. Ankle instability, left   5. Hammertoe of second toe of left foot    Plan:  - Patient was evaluated and treated and all questions answered.  Posterior tibial tendonitis, left - Discussed diagnosis of posterior tibial tendinitis without weakness. - Discussed use of supportive insole to reduce pressure on the posterior tibial tendon. He will schedule an appointment with our pedorthist for custom orthotics.  - Discussed stretching regimen to the Achilles and plantar fascia to decrease tightness  Plantar fasciitis, left - Discussed use of supportive insole to reduce pressure on the posterior tibial tendon. He will schedule an appointment with our pedorthist for custom orthotics.  - Discussed stretching regimen to the Achilles and plantar fascia to decrease tightness - Tapered medrol  dosepak x 6 days ordered to patient's pharmacy on record to decrease inflammation  Residual ankle instability, left - Patient has previously had lateral ankle stabilization surgery -He does feel that his current pain in his ankle may be due to compensation from posterior tibial tendinitis pain. - We discussed acquiring another MRI to evaluate the ankle as we cannot see the previously taken MRIs in the system.  He expressed that he would like to try orthotic to see if that decreases compensation pain before proceeding.  Second hammertoe deformity, left - Discussed diagnosis of hammertoe deformity that is flexible in nature -Discussed surgical intervention including FDL transfer to the second digit and hammertoe correction as needed.  We will discuss moving forward with this at next visit.   Return in about 6 weeks (around 08/18/2024).  Prentice Ovens, DPM AACFAS Fellowship Trained Podiatric Surgeon [facility]

## 2024-07-07 NOTE — Patient Instructions (Signed)

## 2024-07-08 ENCOUNTER — Other Ambulatory Visit: Payer: Self-pay | Admitting: Nurse Practitioner

## 2024-07-09 NOTE — Telephone Encounter (Signed)
 Requested Prescriptions  Pending Prescriptions Disp Refills   valACYclovir  (VALTREX ) 1000 MG tablet [Pharmacy Med Name: VALACYCLOVIR  HCL 1 GRAM TABLET] 90 tablet 0    Sig: TAKE 1 TABLET BY MOUTH EVERY DAY     Antimicrobials:  Antiviral Agents - Anti-Herpetic Passed - 07/09/2024 11:20 AM      Passed - Valid encounter within last 12 months    Recent Outpatient Visits           3 months ago Frequent headaches   [place] [facility] [place], [place] T, NP   3 months ago Frequent headaches   [place] [facility] [place], Melanie DASEN, NP       Future Appointments             In 3 months Cannady, Jolene T, NP [facility], [address]

## 2024-08-12 ENCOUNTER — Ambulatory Visit

## 2024-08-14 DIAGNOSIS — M9904 Segmental and somatic dysfunction of sacral region: Secondary | ICD-10-CM | POA: Diagnosis not present

## 2024-08-14 DIAGNOSIS — M9903 Segmental and somatic dysfunction of lumbar region: Secondary | ICD-10-CM | POA: Diagnosis not present

## 2024-08-14 DIAGNOSIS — M5442 Lumbago with sciatica, left side: Secondary | ICD-10-CM | POA: Diagnosis not present

## 2024-08-14 DIAGNOSIS — M461 Sacroiliitis, not elsewhere classified: Secondary | ICD-10-CM | POA: Diagnosis not present

## 2024-08-19 DIAGNOSIS — M9904 Segmental and somatic dysfunction of sacral region: Secondary | ICD-10-CM | POA: Diagnosis not present

## 2024-08-19 DIAGNOSIS — M461 Sacroiliitis, not elsewhere classified: Secondary | ICD-10-CM | POA: Diagnosis not present

## 2024-08-19 DIAGNOSIS — M9903 Segmental and somatic dysfunction of lumbar region: Secondary | ICD-10-CM | POA: Diagnosis not present

## 2024-08-19 DIAGNOSIS — M5442 Lumbago with sciatica, left side: Secondary | ICD-10-CM | POA: Diagnosis not present

## 2024-08-21 DIAGNOSIS — M461 Sacroiliitis, not elsewhere classified: Secondary | ICD-10-CM | POA: Diagnosis not present

## 2024-08-21 DIAGNOSIS — M9904 Segmental and somatic dysfunction of sacral region: Secondary | ICD-10-CM | POA: Diagnosis not present

## 2024-08-21 DIAGNOSIS — M9903 Segmental and somatic dysfunction of lumbar region: Secondary | ICD-10-CM | POA: Diagnosis not present

## 2024-08-21 DIAGNOSIS — M5442 Lumbago with sciatica, left side: Secondary | ICD-10-CM | POA: Diagnosis not present

## 2024-08-27 ENCOUNTER — Ambulatory Visit

## 2024-08-27 DIAGNOSIS — M722 Plantar fascial fibromatosis: Secondary | ICD-10-CM

## 2024-08-27 DIAGNOSIS — M79672 Pain in left foot: Secondary | ICD-10-CM

## 2024-08-27 DIAGNOSIS — M76822 Posterior tibial tendinitis, left leg: Secondary | ICD-10-CM

## 2024-08-27 DIAGNOSIS — M25372 Other instability, left ankle: Secondary | ICD-10-CM

## 2024-08-27 DIAGNOSIS — M2042 Other hammer toe(s) (acquired), left foot: Secondary | ICD-10-CM

## 2024-08-27 MED ORDER — TRIAMCINOLONE ACETONIDE 10 MG/ML IJ SUSP
5.0000 mg | Freq: Once | INTRAMUSCULAR | Status: AC
  Administered 2024-08-27: 5 mg via INTRAMUSCULAR

## 2024-08-27 NOTE — Progress Notes (Signed)
 Subjective:  Patient ID: Eric Kline, male    DOB: Jan 29, 1981,  MRN: [id number]  Chief Complaint  Patient presents with   Routine Post Op    6 wk f/u left foot  Patient presents to clinic for repeat evaluation.  I saw him 6 weeks ago for left plantar fasciitis, mild posterior tibial tendinitis, residual ankle instability following lateral ankle stabilization, second hammertoe.  He states that he has been furloughed from his job and is in less pain, he believes that this is due to him being able to rest.  He states the bottom of his heel is the most painful spot currently.  He has not acquired orthotics but will be acquiring them from his chiropractor. Medrol  dosepak from last visit was minimally effective.   Interval history 07/07/24: 43 y.o. male presents with the above complaint.  Patient states that approximately 20 years ago he was working in the [employer] and experienced an injury to his left foot and ankle.  He stated this gave him lateral ankle ligament instability and knee pain.  He subsequently had a lateral ankle ligament stabilization surgery performed.  He states that this did improve his stability but he does feel relatively unstable still.  He has subsequently tried physical therapy. Patient does wear supportive shoegear. Denies any other complaints.   Review of Systems: Negative except as noted in the HPI. Denies N/V/F/Ch.  Past Medical History:  Diagnosis Date   Allergy    Concussion    x3   Dysplastic nevus 09/07/2019   L mid back paraspinal - moderate   Dysplastic nevus 09/07/2019   L low back 4.5 cm lat to spine - moderate   Dysplastic nevus 06/16/2020   R pectoral lat to areola - moderate   Dysplastic nevus 06/16/2020   R mid lat bicep - moderate   Dysplastic nevus 12/12/2020   L mid ant thigh - moderate   Dysplastic nevus 01/10/2022   right sup medial buttock, severe Atypia- excised 02/20/22   Iron deficiency anemia    Wears contact lenses     Current  Outpatient Medications:    cyclobenzaprine  (FLEXERIL ) 10 MG tablet, Take 1 tablet (10 mg total) by mouth 3 (three) times daily as needed for muscle spasms., Disp: 30 tablet, Rfl: 0   methylPREDNISolone  (MEDROL  DOSEPAK) 4 MG TBPK tablet, Dispense medrol  dosepak x 6 days with tapered dosing, Disp: 1 each, Rfl: 0   podofilox  (CONDYLOX ) 0.5 % gel, Apply topically every 12 hours in the morning and evening for 3 days, then withhold for 4 days; repeat cycle up to 4 times, Disp: 3.5 g, Rfl: 5   valACYclovir  (VALTREX ) 1000 MG tablet, TAKE 1 TABLET BY MOUTH EVERY DAY, Disp: 90 tablet, Rfl: 0  Social History   Tobacco Use  Smoking Status Former  Smokeless Tobacco Former   Types: Chew   Quit date: 2010  Tobacco Comments   smoked socially in college    No Known Allergies Objective:  There were no vitals filed for this visit. There is no height or weight on file to calculate BMI. Constitutional Well developed. Well nourished. Oriented to person, place, and time.  Vascular Dorsalis pedis pulses palpable bilaterally. Posterior tibial pulses palpable bilaterally. Capillary refill normal to all digits.  No cyanosis or clubbing noted. Pedal hair growth normal.  Neurologic Normal speech. Epicritic sensation to light touch grossly present bilaterally. Negative tinel sign at tarsal tunnel bilaterally.   Dermatologic Skin texture and turgor are within normal limits.  No  open wounds. No skin lesions. Healed incision over lateral malleolus.  Musculoskeletal: 5 out of 5 muscle strength all major pedal muscle groups.  Minimal pain with resisted inversion.  Pain with palpation of the medial calcaneal tubercle with activation of windlass mechanism.  Rectus foot shape with adequate and pain-free range of motion through the foot.  Unable to get to 90 degrees ankle dorsiflexion with knee extended, able to get past 90 with knee flexed.  Negative anterior drawer, does elicit pain.  Pain to palpation lateral ankle  ligament complex.  Minimal pain to palpation anterior gutter of ankle.  2nd digit exhibits reducible hammertoe deformity with lateral deviation and dorsiflexion of the metatarsophalangeal joint.     Assessment:   1. Foot pain, left   2. Plantar fasciitis, left   3. Ankle instability, left   4. Posterior tibial tendonitis, left   5. Hammertoe of second toe of left foot     Plan:  - Patient was evaluated and treated and all questions answered.  Posterior tibial tendonitis, left - Discussed diagnosis of posterior tibial tendinitis without weakness. - Discussed use of supportive insole to reduce pressure on the posterior tibial tendon.  - Discussed stretching regimen to the Achilles and plantar fascia to decrease tightness  Plantar fasciitis, left - Discussed stretching regimen to the Achilles and plantar fascia to decrease tightness - Injection into left plantar fascial as described below was performed today - If patient does not have any relief, we will consider MRI to evaluate both residual ankle instability and plantar fascia  Residual ankle instability, left - Patient has previously had lateral ankle stabilization surgery - We discussed acquiring another MRI to evaluate the ankle as we cannot see the previously taken MRIs in the system.  He expressed that he would like to try orthotic to see if that decreases compensation pain before proceeding.  Second hammertoe deformity, left - Discussed diagnosis of hammertoe deformity that is flexible in nature - He has been receiving ultrasound therapy at the chiropractor, this area is feeling better.  Procedure: Injection tendon/ligament Location: Left plantar fascia Skin Prep: alcohol Injectate: 0.5 cc 0.5% marcaine plain, 0.5 cc of 1% Lidocaine , 0.25 cc kenalog  40. Disposition: Patient tolerated procedure well. Injection site dressed with a band-aid.   No follow-ups on file.  Prentice Ovens, DPM AACFAS Fellowship Trained Podiatric  Surgeon [facility]

## 2024-08-28 DIAGNOSIS — M9903 Segmental and somatic dysfunction of lumbar region: Secondary | ICD-10-CM | POA: Diagnosis not present

## 2024-08-28 DIAGNOSIS — M5442 Lumbago with sciatica, left side: Secondary | ICD-10-CM | POA: Diagnosis not present

## 2024-08-28 DIAGNOSIS — M9904 Segmental and somatic dysfunction of sacral region: Secondary | ICD-10-CM | POA: Diagnosis not present

## 2024-08-28 DIAGNOSIS — M461 Sacroiliitis, not elsewhere classified: Secondary | ICD-10-CM | POA: Diagnosis not present

## 2024-09-04 DIAGNOSIS — M5442 Lumbago with sciatica, left side: Secondary | ICD-10-CM | POA: Diagnosis not present

## 2024-09-04 DIAGNOSIS — M9904 Segmental and somatic dysfunction of sacral region: Secondary | ICD-10-CM | POA: Diagnosis not present

## 2024-09-04 DIAGNOSIS — M9903 Segmental and somatic dysfunction of lumbar region: Secondary | ICD-10-CM | POA: Diagnosis not present

## 2024-09-04 DIAGNOSIS — M461 Sacroiliitis, not elsewhere classified: Secondary | ICD-10-CM | POA: Diagnosis not present

## 2024-09-09 DIAGNOSIS — M9903 Segmental and somatic dysfunction of lumbar region: Secondary | ICD-10-CM | POA: Diagnosis not present

## 2024-09-09 DIAGNOSIS — M461 Sacroiliitis, not elsewhere classified: Secondary | ICD-10-CM | POA: Diagnosis not present

## 2024-09-09 DIAGNOSIS — M9904 Segmental and somatic dysfunction of sacral region: Secondary | ICD-10-CM | POA: Diagnosis not present

## 2024-09-09 DIAGNOSIS — M5442 Lumbago with sciatica, left side: Secondary | ICD-10-CM | POA: Diagnosis not present

## 2024-09-14 DIAGNOSIS — M9904 Segmental and somatic dysfunction of sacral region: Secondary | ICD-10-CM | POA: Diagnosis not present

## 2024-09-14 DIAGNOSIS — M461 Sacroiliitis, not elsewhere classified: Secondary | ICD-10-CM | POA: Diagnosis not present

## 2024-09-14 DIAGNOSIS — M9903 Segmental and somatic dysfunction of lumbar region: Secondary | ICD-10-CM | POA: Diagnosis not present

## 2024-09-14 DIAGNOSIS — M5442 Lumbago with sciatica, left side: Secondary | ICD-10-CM | POA: Diagnosis not present

## 2024-09-21 DIAGNOSIS — M5442 Lumbago with sciatica, left side: Secondary | ICD-10-CM | POA: Diagnosis not present

## 2024-09-21 DIAGNOSIS — M9903 Segmental and somatic dysfunction of lumbar region: Secondary | ICD-10-CM | POA: Diagnosis not present

## 2024-09-21 DIAGNOSIS — M9904 Segmental and somatic dysfunction of sacral region: Secondary | ICD-10-CM | POA: Diagnosis not present

## 2024-09-21 DIAGNOSIS — M461 Sacroiliitis, not elsewhere classified: Secondary | ICD-10-CM | POA: Diagnosis not present

## 2024-09-28 DIAGNOSIS — M9903 Segmental and somatic dysfunction of lumbar region: Secondary | ICD-10-CM | POA: Diagnosis not present

## 2024-09-28 DIAGNOSIS — M9904 Segmental and somatic dysfunction of sacral region: Secondary | ICD-10-CM | POA: Diagnosis not present

## 2024-09-28 DIAGNOSIS — M5442 Lumbago with sciatica, left side: Secondary | ICD-10-CM | POA: Diagnosis not present

## 2024-09-28 DIAGNOSIS — M461 Sacroiliitis, not elsewhere classified: Secondary | ICD-10-CM | POA: Diagnosis not present

## 2024-10-05 DIAGNOSIS — M5442 Lumbago with sciatica, left side: Secondary | ICD-10-CM | POA: Diagnosis not present

## 2024-10-05 DIAGNOSIS — M461 Sacroiliitis, not elsewhere classified: Secondary | ICD-10-CM | POA: Diagnosis not present

## 2024-10-05 DIAGNOSIS — M9904 Segmental and somatic dysfunction of sacral region: Secondary | ICD-10-CM | POA: Diagnosis not present

## 2024-10-05 DIAGNOSIS — M9903 Segmental and somatic dysfunction of lumbar region: Secondary | ICD-10-CM | POA: Diagnosis not present

## 2024-10-08 ENCOUNTER — Ambulatory Visit

## 2024-10-08 DIAGNOSIS — M76822 Posterior tibial tendinitis, left leg: Secondary | ICD-10-CM

## 2024-10-08 DIAGNOSIS — M722 Plantar fascial fibromatosis: Secondary | ICD-10-CM

## 2024-10-08 DIAGNOSIS — M79672 Pain in left foot: Secondary | ICD-10-CM | POA: Diagnosis not present

## 2024-10-08 DIAGNOSIS — M25372 Other instability, left ankle: Secondary | ICD-10-CM | POA: Diagnosis not present

## 2024-10-08 DIAGNOSIS — M2042 Other hammer toe(s) (acquired), left foot: Secondary | ICD-10-CM | POA: Diagnosis not present

## 2024-10-08 NOTE — Progress Notes (Signed)
 Subjective:  Patient ID: Eric Kline, male    DOB: October 26, 1980,  MRN: [id number]  Chief Complaint  Patient presents with   Foot Pain    follow up left foot. He feels that he is so much better  Patient presents to clinic for repeat evaluation. I last saw him 08/27/24 and performed an injection into his left plantar fascia. He states he is feeling much better and is working on stretches. Plans to get orthotics from his chiropractor, currently wearing OTC orthotics. He states he has been in the office for work recently, thinks that has helped his ankle pain as well.   Interval history 08/27/24: Patient presents to clinic for repeat evaluation.  I saw him 6 weeks ago for left plantar fasciitis, mild posterior tibial tendinitis, residual ankle instability following lateral ankle stabilization, second hammertoe.  He states that he has been furloughed from his job and is in less pain, he believes that this is due to him being able to rest.  He states the bottom of his heel is the most painful spot currently.  He has not acquired orthotics but will be acquiring them from his chiropractor. Medrol  dosepak from last visit was minimally effective.     Review of Systems: Negative except as noted in the HPI. Denies N/V/F/Ch.  Past Medical History:  Diagnosis Date   Allergy    Concussion    x3   Dysplastic nevus 09/07/2019   L mid back paraspinal - moderate   Dysplastic nevus 09/07/2019   L low back 4.5 cm lat to spine - moderate   Dysplastic nevus 06/16/2020   R pectoral lat to areola - moderate   Dysplastic nevus 06/16/2020   R mid lat bicep - moderate   Dysplastic nevus 12/12/2020   L mid ant thigh - moderate   Dysplastic nevus 01/10/2022   right sup medial buttock, severe Atypia- excised 02/20/22   Iron deficiency anemia    Wears contact lenses     Current Outpatient Medications:    cyclobenzaprine  (FLEXERIL ) 10 MG tablet, Take 1 tablet (10 mg total) by mouth 3 (three) times daily as needed  for muscle spasms., Disp: 30 tablet, Rfl: 0   methylPREDNISolone  (MEDROL  DOSEPAK) 4 MG TBPK tablet, Dispense medrol  dosepak x 6 days with tapered dosing, Disp: 1 each, Rfl: 0   podofilox  (CONDYLOX ) 0.5 % gel, Apply topically every 12 hours in the morning and evening for 3 days, then withhold for 4 days; repeat cycle up to 4 times, Disp: 3.5 g, Rfl: 5   valACYclovir  (VALTREX ) 1000 MG tablet, TAKE 1 TABLET BY MOUTH EVERY DAY, Disp: 90 tablet, Rfl: 0  Social History   Tobacco Use  Smoking Status Former  Smokeless Tobacco Former   Types: Chew   Quit date: 2010  Tobacco Comments   smoked socially in college    No Known Allergies Objective:  There were no vitals filed for this visit. There is no height or weight on file to calculate BMI. Constitutional Well developed. Well nourished. Oriented to person, place, and time.  Vascular Dorsalis pedis pulses palpable bilaterally. Posterior tibial pulses palpable bilaterally. Capillary refill normal to all digits.  No cyanosis or clubbing noted. Pedal hair growth normal.  Neurologic Normal speech. Epicritic sensation to light touch grossly present bilaterally. Negative tinel sign at tarsal tunnel bilaterally.   Dermatologic Skin texture and turgor are within normal limits.  No open wounds. No skin lesions. Healed incision over lateral malleolus.  Musculoskeletal: 5 out of 5 muscle  strength all major pedal muscle groups.  Minimal pain with resisted inversion.  No pain with palpation of the medial calcaneal tubercle with activation of windlass mechanism.  Rectus foot shape with adequate and pain-free range of motion through the foot.  Unable to get to 90 degrees ankle dorsiflexion with knee extended, able to get past 90 with knee flexed.  Negative anterior drawer, does not elicit pain.  Minimal pain to palpation lateral ankle ligament complex.  Minimal pain to palpation anterior gutter of ankle.  2nd digit exhibits reducible hammertoe deformity  with lateral deviation and dorsiflexion of the metatarsophalangeal joint.     Assessment:   1. Foot pain, left   2. Plantar fasciitis, left   3. Ankle instability, left   4. Posterior tibial tendonitis, left   5. Hammertoe of second toe of left foot      Plan:  - Patient was evaluated and treated and all questions answered.  Plantar fasciitis, left Posterior tibial tendonitis, left - Doing well after plantar fascia injection at last visit - Continue stretching exercises and use of supportive insole   Residual ankle instability, left - Patient has previously had lateral ankle stabilization surgery - Doing well, if this area becomes problematic again will consider MRI for evaluation  Second hammertoe deformity, left - Discussed diagnosis of hammertoe deformity that is flexible in nature  RTC PRN  Prentice Ovens, DPM AACFAS Fellowship Trained Podiatric Surgeon [facility]

## 2024-10-10 DIAGNOSIS — E7800 Pure hypercholesterolemia, unspecified: Secondary | ICD-10-CM | POA: Insufficient documentation

## 2024-10-10 NOTE — Patient Instructions (Signed)
 Be Involved in Caring For Your Health:  Taking Medications When medications are taken as directed, they can greatly improve your health. But if they are not taken as prescribed, they may not work. In some cases, not taking them correctly can be harmful. To help ensure your treatment remains effective and safe, understand your medications and how to take them. Bring your medications to each visit for review by your provider.  Your lab results, notes, and after visit summary will be available on My Chart. We strongly encourage you to use this feature. If lab results are abnormal the clinic will contact you with the appropriate steps. If the clinic does not contact you assume the results are satisfactory. You can always view your results on My Chart. If you have questions regarding your health or results, please contact the clinic during office hours. You can also ask questions on My Chart.  We at [facility] are grateful that you chose us  to provide your care. We strive to provide evidence-based and compassionate care and are always looking for feedback. If you get a survey from the clinic please complete this so we can hear your opinions.  Healthy Eating, Adult Healthy eating may help you get and keep a healthy body weight, reduce the risk of chronic disease, and live a long and productive life. It is important to follow a healthy eating pattern. Your nutritional and calorie needs should be met mainly by different nutrient-rich foods. What are tips for following this plan? Reading food labels Read labels and choose the following: Reduced or low sodium products. Juices with 100% fruit juice. Foods with low saturated fats (<3 g per serving) and high polyunsaturated and monounsaturated fats. Foods with whole grains, such as whole wheat, cracked wheat, brown rice, and wild rice. Whole grains that are fortified with folic acid. This is recommended for females who are pregnant or who want to  become pregnant. Read labels and do not eat or drink the following: Foods or drinks with added sugars. These include foods that contain brown sugar, corn sweetener, corn syrup, dextrose , fructose, glucose, high-fructose corn syrup, honey, invert sugar, lactose, malt syrup, maltose, molasses, raw sugar, sucrose, trehalose, or turbinado sugar. Limit your intake of added sugars to less than 10% of your total daily calories. Do not eat more than the following amounts of added sugar per day: 6 teaspoons (25 g) for females. 9 teaspoons (38 g) for males. Foods that contain processed or refined starches and grains. Refined grain products, such as white flour, degermed cornmeal, white bread, and white rice. Shopping Choose nutrient-rich snacks, such as vegetables, whole fruits, and nuts. Avoid high-calorie and high-sugar snacks, such as potato chips, fruit snacks, and candy. Use oil-based dressings and spreads on foods instead of solid fats such as butter, margarine, sour cream, or cream cheese. Limit pre-made sauces, mixes, and instant products such as flavored rice, instant noodles, and ready-made pasta. Try more plant-protein sources, such as tofu, tempeh, black beans, edamame, lentils, nuts, and seeds. Explore eating plans such as the Mediterranean diet or vegetarian diet. Try heart-healthy dips made with beans and healthy fats like hummus and guacamole. Vegetables go great with these. Cooking Use oil to saut or stir-fry foods instead of solid fats such as butter, margarine, or lard. Try baking, boiling, grilling, or broiling instead of frying. Remove the fatty part of meats before cooking. Steam vegetables in water  or broth. Meal planning  At meals, imagine dividing your plate into fourths: One-half of  your plate is fruits and vegetables. One-fourth of your plate is whole grains. One-fourth of your plate is protein, especially lean meats, poultry, eggs, tofu, beans, or nuts. Include low-fat  dairy as part of your daily diet. Lifestyle Choose healthy options in all settings, including home, work, school, restaurants, or stores. Prepare your food safely: Wash your hands after handling raw meats. Where you prepare food, keep surfaces clean by regularly washing with hot, soapy water . Keep raw meats separate from ready-to-eat foods, such as fruits and vegetables. Cook seafood, meat, poultry, and eggs to the recommended temperature. Get a food thermometer. Store foods at safe temperatures. In general: Keep cold foods at 84F (4.4C) or below. Keep hot foods at 184F (60C) or above. Keep your freezer at [facility] (-17.8C) or below. Foods are not safe to eat if they have been between the temperatures of 40-184F (4.4-60C) for more than 2 hours. What foods should I eat? Fruits Aim to eat 1-2 cups of fresh, canned (in natural juice), or frozen fruits each day. One cup of fruit equals 1 small apple, 1 large banana, 8 large strawberries, 1 cup (237 g) canned fruit,  cup (82 g) dried fruit, or 1 cup (240 mL) 100% juice. Vegetables Aim to eat 2-4 cups of fresh and frozen vegetables each day, including different varieties and colors. One cup of vegetables equals 1 cup (91 g) broccoli or cauliflower florets, 2 medium carrots, 2 cups (150 g) raw, leafy greens, 1 large tomato, 1 large bell pepper, 1 large sweet potato, or 1 medium white potato. Grains Aim to eat 5-10 ounce-equivalents of whole grains each day. Examples of 1 ounce-equivalent of grains include 1 slice of bread, 1 cup (40 g) ready-to-eat cereal, 3 cups (24 g) popcorn, or  cup (93 g) cooked rice. Meats and other proteins Try to eat 5-7 ounce-equivalents of protein each day. Examples of 1 ounce-equivalent of protein include 1 egg,  oz nuts (12 almonds, 24 pistachios, or 7 walnut halves), 1/4 cup (90 g) cooked beans, 6 tablespoons (90 g) hummus or 1 tablespoon (16 g) peanut butter. A cut of meat or fish that is the size of a deck of  cards is about 3-4 ounce-equivalents (85 g). Of the protein you eat each week, try to have at least 8 sounce (227 g) of seafood. This is about 2 servings per week. This includes salmon, trout, herring, sardines, and anchovies. Dairy Aim to eat 3 cup-equivalents of fat-free or low-fat dairy each day. Examples of 1 cup-equivalent of dairy include 1 cup (240 mL) milk, 8 ounces (250 g) yogurt, 1 ounces (44 g) natural cheese, or 1 cup (240 mL) fortified soy milk. Fats and oils Aim for about 5 teaspoons (21 g) of fats and oils per day. Choose monounsaturated fats, such as canola and olive oils, mayonnaise made with olive oil or avocado oil, avocados, peanut butter, and most nuts, or polyunsaturated fats, such as sunflower, corn, and soybean oils, walnuts, pine nuts, sesame seeds, sunflower seeds, and flaxseed. Beverages Aim for 6 eight-ounce glasses of water  per day. Limit coffee to 3-5 eight-ounce cups per day. Limit caffeinated beverages that have added calories, such as soda and energy drinks. If you drink alcohol: Limit how much you have to: 0-1 drink a day if you are male. 0-2 drinks a day if you are male. Know how much alcohol is in your drink. In the [place] one drink is one 12 oz bottle of beer (355 mL), one 5 oz glass of wine (  148 mL), or one 1 oz glass of hard liquor (44 mL). Seasoning and other foods Try not to add too much salt to your food. Try using herbs and spices instead of salt. Try not to add sugar to food. This information is based on [place] nutrition guidelines. To learn more, visit [employer].be. Exact amounts may vary. You may need different amounts. This information is not intended to replace advice given to you by your health care provider. Make sure you discuss any questions you have with your health care provider. Document Revised: 07/09/2022 Document Reviewed: 07/09/2022 Elsevier Patient Education  2024 [employer].

## 2024-10-12 ENCOUNTER — Encounter: Payer: Self-pay | Admitting: Nurse Practitioner

## 2024-10-12 ENCOUNTER — Ambulatory Visit: Payer: Self-pay | Admitting: Nurse Practitioner

## 2024-10-12 VITALS — BP 118/74 | HR 54 | Temp 98.2°F | Resp 15 | Ht 70.0 in | Wt 217.0 lb

## 2024-10-12 DIAGNOSIS — D508 Other iron deficiency anemias: Secondary | ICD-10-CM

## 2024-10-12 DIAGNOSIS — E7800 Pure hypercholesterolemia, unspecified: Secondary | ICD-10-CM

## 2024-10-12 DIAGNOSIS — E559 Vitamin D deficiency, unspecified: Secondary | ICD-10-CM

## 2024-10-12 DIAGNOSIS — R7309 Other abnormal glucose: Secondary | ICD-10-CM | POA: Diagnosis not present

## 2024-10-12 DIAGNOSIS — Z0000 Encounter for general adult medical examination without abnormal findings: Secondary | ICD-10-CM | POA: Diagnosis not present

## 2024-10-12 DIAGNOSIS — F5101 Primary insomnia: Secondary | ICD-10-CM | POA: Diagnosis not present

## 2024-10-12 DIAGNOSIS — M5442 Lumbago with sciatica, left side: Secondary | ICD-10-CM | POA: Diagnosis not present

## 2024-10-12 DIAGNOSIS — M9904 Segmental and somatic dysfunction of sacral region: Secondary | ICD-10-CM | POA: Diagnosis not present

## 2024-10-12 DIAGNOSIS — F1021 Alcohol dependence, in remission: Secondary | ICD-10-CM

## 2024-10-12 DIAGNOSIS — R7401 Elevation of levels of liver transaminase levels: Secondary | ICD-10-CM

## 2024-10-12 DIAGNOSIS — R5383 Other fatigue: Secondary | ICD-10-CM

## 2024-10-12 DIAGNOSIS — M461 Sacroiliitis, not elsewhere classified: Secondary | ICD-10-CM | POA: Diagnosis not present

## 2024-10-12 DIAGNOSIS — M9903 Segmental and somatic dysfunction of lumbar region: Secondary | ICD-10-CM | POA: Diagnosis not present

## 2024-10-12 MED ORDER — CYCLOBENZAPRINE HCL 10 MG PO TABS: 10.0000 mg | ORAL_TABLET | Freq: Three times a day (TID) | ORAL | 0 refills | Status: AC | PRN

## 2024-10-12 MED ORDER — TRAZODONE HCL 50 MG PO TABS: 25.0000 mg | ORAL_TABLET | Freq: Every evening | ORAL | 3 refills | Status: AC | PRN

## 2024-10-12 NOTE — Assessment & Plan Note (Signed)
 History of low levels -- recheck CBC, iron, ferritin today.

## 2024-10-12 NOTE — Assessment & Plan Note (Signed)
 He did have a period of drinking more heavily for 2 weeks during government shutdown, but then returned to cessation. Recommend complete cessation.  Is not taking Naltrexone  - did not tolerate.  Recommend attend AA meetings.  Labs today.

## 2024-10-12 NOTE — Progress Notes (Addendum)
 " z  BP 118/74 (BP Location: Left Arm, Patient Position: Sitting, Cuff Size: Large)   Pulse (!) 54   Temp 98.2 F (36.8 C) (Oral)   Resp 15   Ht 5' 10 (1.778 m)   Wt 217 lb (98.4 kg)   SpO2 98%   BMI 31.14 kg/m    Subjective:    Patient ID: Eric Kline, male    DOB: May 29, 1981, 43 y.o.   MRN: [id number]  HPI: Eric Kline is a 43 y.o. male presenting on 10/12/2024 for comprehensive medical examination. Current medical complaints include:none  He currently lives with: self Interim Problems from his last visit: no  Has had knee injections with ortho and recently had injection for plantar fascitis. Been going to chiropractor weekly for past 2 months.  History of low Vitamin D  and iron dficiency anemia.  INSOMNIA Having a hard time staying asleep, if wakes up at 2 am then will be awake. Melatonin and CBD taken. Is fatigued during the daytime. Duration: months Satisfied with sleep quality: yes Difficulty falling asleep: no Difficulty staying asleep: yes Waking a few hours after sleep onset: no Early morning awakenings: yes Daytime hypersomnolence: no Wakes feeling refreshed: occasional Good sleep hygiene: yes Apnea: no Snoring: no Depressed/anxious mood: yes Recent stress: yes Restless legs/nocturnal leg cramps: no Chronic pain/arthritis: yes History of sleep study: no Treatments attempted: melatonin and CBD  The 10-year ASCVD risk score (Arnett DK, et al., 2019) is: 2.6%   Values used to calculate the score:     Age: 11 years     Clinically relevant sex: Male     Is Non-Hispanic African American: No     Diabetic: No     Tobacco smoker: No     Systolic Blood Pressure: 118 mmHg     Is BP treated: No     HDL Cholesterol: 43 mg/dL     Total Cholesterol: 224 mg/dL  Functional Status Survey: Is the patient deaf or have difficulty hearing?: No Does the patient have difficulty seeing, even when wearing glasses/contacts?: No Does the patient have difficulty  concentrating, remembering, or making decisions?: No Does the patient have difficulty walking or climbing stairs?: No Does the patient have difficulty dressing or bathing?: No Does the patient have difficulty doing errands alone such as visiting a doctor's office or shopping?: No  FALL RISK:    10/12/2024    7:59 AM 04/06/2024    1:11 PM 03/23/2024    9:40 AM 10/10/2023    8:12 AM 12/21/2022    9:04 AM  Fall Risk   Falls in the past year? 0 0 0 0 0  Number falls in past yr: 0 0 0 0 0  Injury with Fall? 0 0  0  0  0   Risk for fall due to : No Fall Risks No Fall Risks No Fall Risks No Fall Risks No Fall Risks  Follow up  Falls evaluation completed Falls evaluation completed Falls evaluation completed Falls evaluation completed     Data saved with a previous flowsheet row definition       10/12/2024    7:59 AM 04/06/2024    1:11 PM 03/23/2024    9:40 AM 10/10/2023    8:13 AM 12/21/2022    9:04 AM  Depression screen PHQ 2/9  Decreased Interest 1 0 1 3 1   Down, Depressed, Hopeless 0 0 1 3 0  PHQ - 2 Score 1 0 2 6 1   Altered sleeping 2  3 3  1  Tired, decreased energy 2  2 3 1   Change in appetite 1  1 3 1   Feeling bad or failure about yourself  0  0 1 0  Trouble concentrating 2  1 3 1   Moving slowly or fidgety/restless 1  2 3 1   Suicidal thoughts 0  0 0 0  PHQ-9 Score 9  11  22  6    Difficult doing work/chores Somewhat difficult  Somewhat difficult Extremely dIfficult Somewhat difficult     Data saved with a previous flowsheet row definition       10/12/2024    7:59 AM 04/06/2024    1:11 PM 03/23/2024    9:41 AM 10/10/2023    8:13 AM  GAD 7 : Generalized Anxiety Score  Nervous, Anxious, on Edge 1 1 1 3   Control/stop worrying 0 0 0 2  Worry too much - different things 0 0 1 2  Trouble relaxing 1 1 2 3   Restless 1 1 1 3   Easily annoyed or irritable 1 1 1 3   Afraid - awful might happen 0 0 0 3  Total GAD 7 Score 4 4 6 19   Anxiety Difficulty Somewhat difficult  Somewhat  difficult Extremely difficult   Past Medical History:  Past Medical History:  Diagnosis Date   Allergy    Concussion    x3   Dysplastic nevus 09/07/2019   L mid back paraspinal - moderate   Dysplastic nevus 09/07/2019   L low back 4.5 cm lat to spine - moderate   Dysplastic nevus 06/16/2020   R pectoral lat to areola - moderate   Dysplastic nevus 06/16/2020   R mid lat bicep - moderate   Dysplastic nevus 12/12/2020   L mid ant thigh - moderate   Dysplastic nevus 01/10/2022   right sup medial buttock, severe Atypia- excised 02/20/22   Iron deficiency anemia    Wears contact lenses     Surgical History:  Past Surgical History:  Procedure Laterality Date   ANKLE ARTHROSCOPY Left 04/2017   COLONOSCOPY WITH PROPOFOL  N/A 01/07/2019   Procedure: COLONOSCOPY WITH PROPOFOL ;  Surgeon: Unk Corinn Skiff, MD;  Location: [facility];  Service: Endoscopy;  Laterality: N/A;   ESOPHAGOGASTRODUODENOSCOPY (EGD) WITH PROPOFOL  N/A 01/07/2019   Procedure: ESOPHAGOGASTRODUODENOSCOPY (EGD) WITH BIOPSIES;  Surgeon: Unk Corinn Skiff, MD;  Location: [facility];  Service: Endoscopy;  Laterality: N/A;   MENISCUS REPAIR  2002   left    Medications:  Current Outpatient Medications on File Prior to Visit  Medication Sig   valACYclovir  (VALTREX ) 1000 MG tablet TAKE 1 TABLET BY MOUTH EVERY DAY   No current facility-administered medications on file prior to visit.   Social History:  Social History   Socioeconomic History   Marital status: Single    Spouse name: Not on file   Number of children: Not on file   Years of education: Not on file   Highest education level: Not on file  Occupational History   Not on file  Tobacco Use   Smoking status: Former   Smokeless tobacco: Former    Types: Chew    Quit date: 2010   Tobacco comments:    smoked socially in Barrister's Clerk Use   Vaping status: Never Used  Substance and Sexual Activity   Alcohol use: Not Currently   Drug  use: No    Comment: pt states he has been clean for the last 5 months- previous marijuana use    Sexual  activity: Not Currently  Other Topics Concern   Not on file  Social History Narrative   Not on file   Social Drivers of Health   Tobacco Use: Medium Risk (10/12/2024)   Patient History    Smoking Tobacco Use: Former    Smokeless Tobacco Use: Former    Passive Exposure: Not on Actuary Strain: Low Risk (10/08/2022)   Overall Financial Resource Strain (CARDIA)    Difficulty of Paying Living Expenses: Not hard at all  Food Insecurity: No Food Insecurity (10/08/2022)   Hunger Vital Sign    Worried About Running Out of Food in the Last Year: Never true    Ran Out of Food in the Last Year: Never true  Transportation Needs: No Transportation Needs (10/08/2022)   PRAPARE - Administrator, Civil Service (Medical): No    Lack of Transportation (Non-Medical): No  Physical Activity: Insufficiently Active (10/08/2022)   Exercise Vital Sign    Days of Exercise per Week: 4 days    Minutes of Exercise per Session: 30 min  Stress: Stress Concern Present (10/08/2022)   [employer] of Occupational Health - Occupational Stress Questionnaire    Feeling of Stress : To some extent  Social Connections: Socially Isolated (10/08/2022)   Social Connection and Isolation Panel    Frequency of Communication with Friends and Family: More than three times a week    Frequency of Social Gatherings with Friends and Family: More than three times a week    Attends Religious Services: Never    Database Administrator or Organizations: No    Attends Banker Meetings: Never    Marital Status: Never married  Intimate Partner Violence: Not At Risk (10/08/2022)   Humiliation, Afraid, Rape, and Kick questionnaire    Fear of Current or Ex-Partner: No    Emotionally Abused: No    Physically Abused: No    Sexually Abused: No  Depression (PHQ2-9): Medium Risk  (10/12/2024)   Depression (PHQ2-9)    PHQ-2 Score: 9  Alcohol Screen: Low Risk (10/08/2022)   Alcohol Screen    Last Alcohol Screening Score (AUDIT): 2  Housing: Low Risk (10/08/2022)   Housing    Last Housing Risk Score: 0  Utilities: Not At Risk (10/08/2022)   AHC Utilities    Threatened with loss of utilities: No  Health Literacy: Not on file   Social History   Tobacco Use  Smoking Status Former  Smokeless Tobacco Former   Types: Chew   Quit date: 2010  Tobacco Comments   smoked socially in college   Social History   Substance and Sexual Activity  Alcohol Use Not Currently   Family History:  Family History  Problem Relation Age of Onset   Cancer Mother        skin   Alzheimer's disease Maternal Grandfather     Past medical history, surgical history, medications, allergies, family history and social history reviewed with patient today and changes made to appropriate areas of the chart.   ROS All other ROS negative except what is listed above and in the HPI.      Objective:    BP 118/74 (BP Location: Left Arm, Patient Position: Sitting, Cuff Size: Large)   Pulse (!) 54   Temp 98.2 F (36.8 C) (Oral)   Resp 15   Ht 5' 10 (1.778 m)   Wt 217 lb (98.4 kg)   SpO2 98%   BMI 31.14 kg/m   Wt  Readings from Last 3 Encounters:  10/12/24 217 lb (98.4 kg)  03/23/24 216 lb 6.4 oz (98.2 kg)  10/10/23 215 lb (97.5 kg)    Physical Exam Vitals and nursing note reviewed.  Constitutional:      General: He is awake. He is not in acute distress.    Appearance: Normal appearance. He is well-developed and well-groomed. He is not ill-appearing or toxic-appearing.  HENT:     Head: Normocephalic and atraumatic.     Right Ear: Hearing, tympanic membrane, ear canal and external ear normal. No drainage.     Left Ear: Hearing, tympanic membrane, ear canal and external ear normal. No drainage.     Nose: Nose normal.     Mouth/Throat:     Pharynx: Uvula midline.  Eyes:      General: Lids are normal.        Right eye: No discharge.        Left eye: No discharge.     Extraocular Movements: Extraocular movements intact.     Conjunctiva/sclera: Conjunctivae normal.     Pupils: Pupils are equal, round, and reactive to light.     Visual Fields: Right eye visual fields normal and left eye visual fields normal.  Neck:     Thyroid : No thyromegaly.     Vascular: No carotid bruit or JVD.     Trachea: Trachea normal.  Cardiovascular:     Rate and Rhythm: Regular rhythm. Bradycardia present.     Heart sounds: Normal heart sounds, S1 normal and S2 normal. No murmur heard.    No gallop.  Pulmonary:     Effort: Pulmonary effort is normal. No accessory muscle usage or respiratory distress.     Breath sounds: Normal breath sounds.  Abdominal:     General: Bowel sounds are normal.     Palpations: Abdomen is soft. There is no hepatomegaly or splenomegaly.     Tenderness: There is no abdominal tenderness.  Musculoskeletal:        General: Normal range of motion.     Cervical back: Normal range of motion and neck supple.     Right lower leg: No edema.     Left lower leg: No edema.  Lymphadenopathy:     Head:     Right side of head: No submental, submandibular, tonsillar, preauricular or posterior auricular adenopathy.     Left side of head: No submental, submandibular, tonsillar, preauricular or posterior auricular adenopathy.     Cervical: No cervical adenopathy.  Skin:    General: Skin is warm and dry.     Capillary Refill: Capillary refill takes less than 2 seconds.     Findings: No rash.  Neurological:     Mental Status: He is alert and oriented to person, place, and time.     Gait: Gait is intact.     Deep Tendon Reflexes: Reflexes are normal and symmetric.     Reflex Scores:      Brachioradialis reflexes are 2+ on the right side and 2+ on the left side.      Patellar reflexes are 2+ on the right side and 2+ on the left side. Psychiatric:        Attention and  Perception: Attention normal.        Mood and Affect: Mood normal.        Speech: Speech normal.        Behavior: Behavior normal. Behavior is cooperative.        Thought Content: Thought  content normal.        Cognition and Memory: Cognition normal.    Results for orders placed or performed in visit on 10/10/23  CBC with Differential/Platelet   Collection Time: 10/10/23  8:30 AM  Result Value Ref Range   WBC 8.9 3.4 - 10.8 x10E3/uL   RBC 4.99 4.14 - 5.80 x10E6/uL   Hemoglobin 15.7 13.0 - 17.7 g/dL   Hematocrit 52.4 62.4 - 51.0 %   MCV 95 79 - 97 fL   MCH 31.5 26.6 - 33.0 pg   MCHC 33.1 31.5 - 35.7 g/dL   RDW 87.9 88.3 - 84.5 %   Platelets 330 150 - 450 x10E3/uL   Neutrophils 63 Not Estab. %   Lymphs 28 Not Estab. %   Monocytes 6 Not Estab. %   Eos 3 Not Estab. %   Basos 0 Not Estab. %   Neutrophils Absolute 5.6 1.4 - 7.0 x10E3/uL   Lymphocytes Absolute 2.5 0.7 - 3.1 x10E3/uL   Monocytes Absolute 0.5 0.1 - 0.9 x10E3/uL   EOS (ABSOLUTE) 0.2 0.0 - 0.4 x10E3/uL   Basophils Absolute 0.0 0.0 - 0.2 x10E3/uL   Immature Granulocytes 0 Not Estab. %   Immature Grans (Abs) 0.0 0.0 - 0.1 x10E3/uL  Comprehensive metabolic panel   Collection Time: 10/10/23  8:30 AM  Result Value Ref Range   Glucose 91 70 - 99 mg/dL   BUN 11 6 - 24 mg/dL   Creatinine, Ser 8.77 0.76 - 1.27 mg/dL   eGFR 75 >40 fO/fpw/8.26   BUN/Creatinine Ratio 9 9 - 20   Sodium 140 134 - 144 mmol/L   Potassium 4.7 3.5 - 5.2 mmol/L   Chloride 103 96 - 106 mmol/L   CO2 21 20 - 29 mmol/L   Calcium 9.7 8.7 - 10.2 mg/dL   Total Protein 7.3 6.0 - 8.5 g/dL   Albumin 5.0 4.1 - 5.1 g/dL   Globulin, Total 2.3 1.5 - 4.5 g/dL   Bilirubin Total 0.3 0.0 - 1.2 mg/dL   Alkaline Phosphatase 92 44 - 121 IU/L   AST 20 0 - 40 IU/L   ALT 22 0 - 44 IU/L  TSH   Collection Time: 10/10/23  8:30 AM  Result Value Ref Range   TSH 1.940 0.450 - 4.500 uIU/mL  Lipid Panel w/o Chol/HDL Ratio   Collection Time: 10/10/23  8:30 AM  Result  Value Ref Range   Cholesterol, Total 224 (H) 100 - 199 mg/dL   Triglycerides 744 (H) 0 - 149 mg/dL   HDL 43 >60 mg/dL   VLDL Cholesterol Cal 46 (H) 5 - 40 mg/dL   LDL Chol Calc (NIH) 864 (H) 0 - 99 mg/dL  VITAMIN D  25 Hydroxy (Vit-D Deficiency, Fractures)   Collection Time: 10/10/23  8:30 AM  Result Value Ref Range   Vit D, 25-Hydroxy 28.2 (L) 30.0 - 100.0 ng/mL  Ferritin   Collection Time: 10/10/23  8:30 AM  Result Value Ref Range   Ferritin 267 30 - 400 ng/mL  Iron   Collection Time: 10/10/23  8:30 AM  Result Value Ref Range   Iron 83 38 - 169 ug/dL  HgB J8r   Collection Time: 10/10/23  8:30 AM  Result Value Ref Range   Hgb A1c MFr Bld 5.3 4.8 - 5.6 %   Est. average glucose Bld gHb Est-mCnc 105 mg/dL      Assessment & Plan:   Problem List Items Addressed This Visit       Other  Vitamin D  deficiency   Ongoing, to take supplement daily.  Continue this regimen and recheck levels today.      Relevant Orders   VITAMIN D  25 Hydroxy (Vit-D Deficiency, Fractures)   Primary insomnia   Can fall asleep, but will have early morning wake up periods, as 2 am. Recommend complete cessation of alcohol use. He has tried OTC supplements with no benefit. Trial Trazodone  25-50 MG nightly PRN for insomnia, educated him on this medication and use. Could consider sleep study in future if ongoing, discussed with him. Labs today.      Iron deficiency anemia   History of low levels -- recheck CBC, iron, ferritin today.      Relevant Orders   Ferritin   Iron   CBC with Differential/Platelet   TSH   History of alcoholism (HCC) - Primary   He did have a period of drinking more heavily for 2 weeks during government shutdown, but then returned to cessation. Recommend complete cessation.  Is not taking Naltrexone  - did not tolerate.  Recommend attend AA meetings.  Labs today.      Relevant Orders   Comprehensive metabolic panel with GFR   Elevated low density lipoprotein (LDL) cholesterol  level   Noted on past labs. Continue to focus on diet and regular exercise, initiate medication as needed in future. The 10-year ASCVD risk score (Arnett DK, et al., 2019) is: 2.6%   Values used to calculate the score:     Age: 59 years     Clinically relevant sex: Male     Is Non-Hispanic African American: No     Diabetic: No     Tobacco smoker: No     Systolic Blood Pressure: 118 mmHg     Is BP treated: No     HDL Cholesterol: 43 mg/dL     Total Cholesterol: 224 mg/dL       Relevant Orders   Comprehensive metabolic panel with GFR   Lipid Panel w/o Chol/HDL Ratio   Elevated glucose   Noted on past labs -- A1c stable on last check, discussed with patient no diabetes or prediabetes last check.  Recheck today.      Relevant Orders   HgB A1c   Elevated ALT measurement   Recheck today, recommend complete cessation alcohol use.  Consider imaging if higher elevations noted.  He did have a period of drinking more heavily for 2 weeks during government shutdown, but then returned to cessation.      Other Visit Diagnoses       Fatigue, unspecified type       Checke testosterone  level today and if low recheck in 4 weeks, send to urology as needed. Discussed with patient.   Relevant Orders   Testosterone ,Free and Total     Encounter for annual physical exam       Annual physical today with labs and health maintenance reviewed, discussed with patient.       Discussed aspirin prophylaxis for myocardial infarction prevention and decision was it was not indicated  LABORATORY TESTING:  Health maintenance labs ordered today as discussed above.   IMMUNIZATIONS:   - Tdap: Tetanus vaccination status reviewed: last tetanus booster within 10 years. - Influenza: Refused - has adverse reactions in past - Pneumovax: Not applicable - Prevnar: Not applicable - Zostavax vaccine: Not applicable - Covid: initial ones only  SCREENING: - Colonoscopy: Not applicable  Discussed with patient  purpose of the colonoscopy is to detect colon cancer at curable  precancerous or early stages   - AAA Screening: Not applicable  -Hearing Test: Not applicable  -Spirometry: Not applicable   PATIENT COUNSELING:    Sexuality: Discussed sexually transmitted diseases, partner selection, use of condoms, avoidance of unintended pregnancy  and contraceptive alternatives.   Advised to avoid cigarette smoking.  I discussed with the patient that most people either abstain from alcohol or drink within safe limits (<=14/week and <=4 drinks/occasion for males, <=7/weeks and <= 3 drinks/occasion for females) and that the risk for alcohol disorders and other health effects rises proportionally with the number of drinks per week and how often a drinker exceeds daily limits.  Discussed cessation/primary prevention of drug use and availability of treatment for abuse.   Diet: Encouraged to adjust caloric intake to maintain  or achieve ideal body weight, to reduce intake of dietary saturated fat and total fat, to limit sodium intake by avoiding high sodium foods and not adding table salt, and to maintain adequate dietary potassium and calcium preferably from fresh fruits, vegetables, and low-fat dairy products.    Stressed the importance of regular exercise  Injury prevention: Discussed safety belts, safety helmets, smoke detector, smoking near bedding or upholstery.   Dental health: Discussed importance of regular tooth brushing, flossing, and dental visits.   Follow up plan: NEXT PREVENTATIVE PHYSICAL DUE IN 1 YEAR. Return in about 6 weeks (around 11/23/2024) for Insomnia (virtual) and 1 year for annual physical.  "

## 2024-10-12 NOTE — Assessment & Plan Note (Signed)
 Can fall asleep, but will have early morning wake up periods, as 2 am. Recommend complete cessation of alcohol use. He has tried OTC supplements with no benefit. Trial Trazodone  25-50 MG nightly PRN for insomnia, educated him on this medication and use. Could consider sleep study in future if ongoing, discussed with him. Labs today.

## 2024-10-12 NOTE — Assessment & Plan Note (Signed)
 Noted on past labs. Continue to focus on diet and regular exercise, initiate medication as needed in future. The 10-year ASCVD risk score (Arnett DK, et al., 2019) is: 2.6%   Values used to calculate the score:     Age: 43 years     Clinically relevant sex: Male     Is Non-Hispanic African American: No     Diabetic: No     Tobacco smoker: No     Systolic Blood Pressure: 118 mmHg     Is BP treated: No     HDL Cholesterol: 43 mg/dL     Total Cholesterol: 224 mg/dL

## 2024-10-12 NOTE — Assessment & Plan Note (Signed)
 Recheck today, recommend complete cessation alcohol use.  Consider imaging if higher elevations noted.  He did have a period of drinking more heavily for 2 weeks during government shutdown, but then returned to cessation.

## 2024-10-12 NOTE — Assessment & Plan Note (Signed)
 Ongoing, to take supplement daily.  Continue this regimen and recheck levels today.

## 2024-10-12 NOTE — Assessment & Plan Note (Signed)
 Noted on past labs -- A1c stable on last check, discussed with patient no diabetes or prediabetes last check.  Recheck today.

## 2024-10-13 ENCOUNTER — Ambulatory Visit: Payer: Self-pay | Admitting: Nurse Practitioner

## 2024-10-13 LAB — CBC WITH DIFFERENTIAL/PLATELET
Basophils Absolute: 0 x10E3/uL (ref 0.0–0.2)
Basos: 1 %
EOS (ABSOLUTE): 0.2 x10E3/uL (ref 0.0–0.4)
Eos: 2 %
Hematocrit: 46.3 % (ref 37.5–51.0)
Hemoglobin: 15.6 g/dL (ref 13.0–17.7)
Immature Grans (Abs): 0 x10E3/uL (ref 0.0–0.1)
Immature Granulocytes: 0 %
Lymphocytes Absolute: 2.5 x10E3/uL (ref 0.7–3.1)
Lymphs: 28 %
MCH: 31.9 pg (ref 26.6–33.0)
MCHC: 33.7 g/dL (ref 31.5–35.7)
MCV: 95 fL (ref 79–97)
Monocytes Absolute: 0.5 x10E3/uL (ref 0.1–0.9)
Monocytes: 5 %
Neutrophils Absolute: 5.6 x10E3/uL (ref 1.4–7.0)
Neutrophils: 64 %
Platelets: 328 x10E3/uL (ref 150–450)
RBC: 4.89 x10E6/uL (ref 4.14–5.80)
RDW: 11.8 % (ref 11.6–15.4)
WBC: 8.8 x10E3/uL (ref 3.4–10.8)

## 2024-10-13 LAB — LIPID PANEL W/O CHOL/HDL RATIO
Cholesterol, Total: 174 mg/dL (ref 100–199)
HDL: 32 mg/dL — ABNORMAL LOW
LDL Chol Calc (NIH): 105 mg/dL — ABNORMAL HIGH (ref 0–99)
Triglycerides: 213 mg/dL — ABNORMAL HIGH (ref 0–149)
VLDL Cholesterol Cal: 37 mg/dL (ref 5–40)

## 2024-10-13 LAB — COMPREHENSIVE METABOLIC PANEL WITH GFR
ALT: 24 IU/L (ref 0–44)
AST: 18 IU/L (ref 0–40)
Albumin: 4.9 g/dL (ref 4.1–5.1)
Alkaline Phosphatase: 87 IU/L (ref 47–123)
BUN/Creatinine Ratio: 14 (ref 9–20)
BUN: 14 mg/dL (ref 6–24)
Bilirubin Total: 0.5 mg/dL (ref 0.0–1.2)
CO2: 23 mmol/L (ref 20–29)
Calcium: 9.6 mg/dL (ref 8.7–10.2)
Chloride: 103 mmol/L (ref 96–106)
Creatinine, Ser: 0.97 mg/dL (ref 0.76–1.27)
Globulin, Total: 2.1 g/dL (ref 1.5–4.5)
Glucose: 85 mg/dL (ref 70–99)
Potassium: 4.3 mmol/L (ref 3.5–5.2)
Sodium: 141 mmol/L (ref 134–144)
Total Protein: 7 g/dL (ref 6.0–8.5)
eGFR: 98 mL/min/1.73

## 2024-10-13 LAB — IRON: Iron: 107 ug/dL (ref 38–169)

## 2024-10-13 LAB — TSH: TSH: 1.74 u[IU]/mL (ref 0.450–4.500)

## 2024-10-13 LAB — HEMOGLOBIN A1C
Est. average glucose Bld gHb Est-mCnc: 103 mg/dL
Hgb A1c MFr Bld: 5.2 % (ref 4.8–5.6)

## 2024-10-13 LAB — TESTOSTERONE,FREE AND TOTAL
Testosterone, Free: 16.9 pg/mL (ref 6.8–21.5)
Testosterone: 360 ng/dL (ref 264–916)

## 2024-10-13 LAB — FERRITIN: Ferritin: 309 ng/mL (ref 30–400)

## 2024-10-13 LAB — VITAMIN D 25 HYDROXY (VIT D DEFICIENCY, FRACTURES): Vit D, 25-Hydroxy: 32.9 ng/mL (ref 30.0–100.0)

## 2024-10-13 NOTE — Progress Notes (Signed)
 Contacted via MyChart The 10-year ASCVD risk score (Arnett DK, et al., 2019) is: 2.4%   Values used to calculate the score:     Age: 43 years     Clinically relevant sex: Male     Is Non-Hispanic African American: No     Diabetic: No     Tobacco smoker: No     Systolic Blood Pressure: 118 mmHg     Is BP treated: No     HDL Cholesterol: 32 mg/dL     Total Cholesterol: 174 mg/dL  Good afternoon Eric Kline, your labs have returned: - LDL remains elevated, bad cholesterol, but has come down a little from last check. Triglycerides also a little elevated. For this continue to focus on healthy diet and regular exercise. - Iron level and CBC stable. - Kidney function, creatinine and eGFR, remains normal, as is liver function, AST and ALT.  - Thyroid , Vitamin D , and diabetes testing A1c are normal range. - Testosterone  is 360, we like it above 300. At this point replacement would not be recommended. Any questions? Keep being stellar!!  Thank you for allowing me to participate in your care.  I appreciate you. Kindest regards, Achilles Neville

## 2024-11-21 NOTE — Patient Instructions (Incomplete)
 Be Involved in Caring For Your Health:  Taking Medications When medications are taken as directed, they can greatly improve your health. But if they are not taken as prescribed, they may not work. In some cases, not taking them correctly can be harmful. To help ensure your treatment remains effective and safe, understand your medications and how to take them. Bring your medications to each visit for review by your provider.  Your lab results, notes, and after visit summary will be available on My Chart. We strongly encourage you to use this feature. If lab results are abnormal the clinic will contact you with the appropriate steps. If the clinic does not contact you assume the results are satisfactory. You can always view your results on My Chart. If you have questions regarding your health or results, please contact the clinic during office hours. You can also ask questions on My Chart.  We at [facility] are grateful that you chose [place] to provide your care. We strive to provide evidence-based and compassionate care and are always looking for feedback. If you get a survey from the clinic please complete this so we can hear your opinions.  Insomnia Insomnia is a sleep disorder that makes it difficult to fall asleep or stay asleep. Insomnia can cause fatigue, low energy, difficulty concentrating, mood swings, and poor performance at work or school. There are three different ways to classify insomnia: Difficulty falling asleep. Difficulty staying asleep. Waking up too early in the morning. Any type of insomnia can be long-term (chronic) or short-term (acute). Both are common. Short-term insomnia usually lasts for 3 months or less. Chronic insomnia occurs at least three times a week for longer than 3 months. What are the causes? Insomnia may be caused by another condition, situation, or substance, such as: Having certain mental health conditions, such as anxiety and depression. Using  caffeine, alcohol, tobacco, or drugs. Having gastrointestinal conditions, such as gastroesophageal reflux disease (GERD). Having certain medical conditions. These include: Asthma. Alzheimer's disease. Stroke. Chronic pain. An overactive thyroid gland (hyperthyroidism). Other sleep disorders, such as restless legs syndrome and sleep apnea. Menopause. Sometimes, the cause of insomnia may not be known. What increases the risk? Risk factors for insomnia include: Gender. Females are affected more often than males. Age. Insomnia is more common as people get older. Stress and certain medical and mental health conditions. Lack of exercise. Having an irregular work schedule. This may include working night shifts and traveling between different time zones. What are the signs or symptoms? If you have insomnia, the main symptom is having trouble falling asleep or having trouble staying asleep. This may lead to other symptoms, such as: Feeling tired or having low energy. Feeling nervous about going to sleep. Not feeling rested in the morning. Having trouble concentrating. Feeling irritable, anxious, or depressed. How is this diagnosed? This condition may be diagnosed based on: Your symptoms and medical history. Your health care provider may ask about: Your sleep habits. Any medical conditions you have. Your mental health. A physical exam. How is this treated? Treatment for insomnia depends on the cause. Treatment may focus on treating an underlying condition that is causing the insomnia. Treatment may also include: Medicines to help you sleep. Counseling or therapy. Lifestyle adjustments to help you sleep better. Follow these instructions at home: Eating and drinking  Limit or avoid alcohol, caffeinated beverages, and products that contain nicotine and tobacco, especially close to bedtime. These can disrupt your sleep. Do not eat a large  meal or eat spicy foods right before bedtime. This  can lead to digestive discomfort that can make it hard for you to sleep. Sleep habits  Keep a sleep diary to help you and your health care provider figure out what could be causing your insomnia. Write down: When you sleep. When you wake up during the night. How well you sleep and how rested you feel the next day. Any side effects of medicines you are taking. What you eat and drink. Make your bedroom a dark, comfortable place where it is easy to fall asleep. Put up shades or blackout curtains to block light from outside. Use a white noise machine to block noise. Keep the temperature cool. Limit screen use before bedtime. This includes: Not watching TV. Not using your smartphone, tablet, or computer. Stick to a routine that includes going to bed and waking up at the same times every day and night. This can help you fall asleep faster. Consider making a quiet activity, such as reading, part of your nighttime routine. Try to avoid taking naps during the day so that you sleep better at night. Get out of bed if you are still awake after 15 minutes of trying to sleep. Keep the lights down, but try reading or doing a quiet activity. When you feel sleepy, go back to bed. General instructions Take over-the-counter and prescription medicines only as told by your health care provider. Exercise regularly as told by your health care provider. However, avoid exercising in the hours right before bedtime. Use relaxation techniques to manage stress. Ask your health care provider to suggest some techniques that may work well for you. These may include: Breathing exercises. Routines to release muscle tension. Visualizing peaceful scenes. Make sure that you drive carefully. Do not drive if you feel very sleepy. Keep all follow-up visits. This is important. Contact a health care provider if: You are tired throughout the day. You have trouble in your daily routine due to sleepiness. You continue to have  sleep problems, or your sleep problems get worse. Get help right away if: You have thoughts about hurting yourself or someone else. Get help right away if you feel like you may hurt yourself or others, or have thoughts about taking your own life. Go to your nearest emergency room or: Call 911. Call the National Suicide Prevention Lifeline at 512-196-3132 or 988. This is open 24 hours a day. Text the Crisis Text Line at 825-066-1380. Summary Insomnia is a sleep disorder that makes it difficult to fall asleep or stay asleep. Insomnia can be long-term (chronic) or short-term (acute). Treatment for insomnia depends on the cause. Treatment may focus on treating an underlying condition that is causing the insomnia. Keep a sleep diary to help you and your health care provider figure out what could be causing your insomnia. This information is not intended to replace advice given to you by your health care provider. Make sure you discuss any questions you have with your health care provider. Document Revised: 09/18/2021 Document Reviewed: 09/18/2021 [employer] Patient Education  2024 [employer].

## 2024-11-23 ENCOUNTER — Ambulatory Visit: Admitting: Nurse Practitioner

## 2024-12-18 ENCOUNTER — Ambulatory Visit: Admitting: Nurse Practitioner

## 2025-10-13 ENCOUNTER — Encounter: Admitting: Nurse Practitioner
